# Patient Record
Sex: Female | Born: 1955 | Race: White | Hispanic: No | Smoking: Never smoker
Health system: Southern US, Community
[De-identification: ages and names within clinical notes are randomized; demographics above are authoritative.]

## PROBLEM LIST (undated history)

## (undated) DIAGNOSIS — Z973 Presence of spectacles and contact lenses: Secondary | ICD-10-CM

## (undated) DIAGNOSIS — IMO0001 Reserved for inherently not codable concepts without codable children: Secondary | ICD-10-CM

## (undated) DIAGNOSIS — Z9889 Other specified postprocedural states: Secondary | ICD-10-CM

## (undated) DIAGNOSIS — Z8679 Personal history of other diseases of the circulatory system: Secondary | ICD-10-CM

## (undated) DIAGNOSIS — R112 Nausea with vomiting, unspecified: Secondary | ICD-10-CM

## (undated) DIAGNOSIS — N201 Calculus of ureter: Secondary | ICD-10-CM

## (undated) DIAGNOSIS — H269 Unspecified cataract: Secondary | ICD-10-CM

## (undated) DIAGNOSIS — M1990 Unspecified osteoarthritis, unspecified site: Secondary | ICD-10-CM

## (undated) DIAGNOSIS — Z8619 Personal history of other infectious and parasitic diseases: Secondary | ICD-10-CM

## (undated) DIAGNOSIS — Z87442 Personal history of urinary calculi: Secondary | ICD-10-CM

## (undated) DIAGNOSIS — I10 Essential (primary) hypertension: Secondary | ICD-10-CM

## (undated) DIAGNOSIS — G4730 Sleep apnea, unspecified: Secondary | ICD-10-CM

## (undated) DIAGNOSIS — G4733 Obstructive sleep apnea (adult) (pediatric): Secondary | ICD-10-CM

## (undated) DIAGNOSIS — M419 Scoliosis, unspecified: Secondary | ICD-10-CM

## (undated) DIAGNOSIS — F419 Anxiety disorder, unspecified: Secondary | ICD-10-CM

## (undated) DIAGNOSIS — F32A Depression, unspecified: Secondary | ICD-10-CM

## (undated) DIAGNOSIS — R609 Edema, unspecified: Secondary | ICD-10-CM

## (undated) DIAGNOSIS — F329 Major depressive disorder, single episode, unspecified: Secondary | ICD-10-CM

## (undated) DIAGNOSIS — Z8719 Personal history of other diseases of the digestive system: Secondary | ICD-10-CM

## (undated) DIAGNOSIS — K449 Diaphragmatic hernia without obstruction or gangrene: Secondary | ICD-10-CM

## (undated) DIAGNOSIS — R202 Paresthesia of skin: Secondary | ICD-10-CM

## (undated) HISTORY — PX: HYSTEROSCOPY WITH D & C: SHX1775

## (undated) HISTORY — DX: Depression, unspecified: F32.A

## (undated) HISTORY — PX: LASIK: SHX215

## (undated) HISTORY — PX: BARIATRIC SURGERY: SHX1103

## (undated) HISTORY — PX: EYE SURGERY: SHX253

## (undated) HISTORY — DX: Unspecified cataract: H26.9

## (undated) HISTORY — DX: Personal history of other infectious and parasitic diseases: Z86.19

## (undated) HISTORY — PX: JOINT REPLACEMENT: SHX530

## (undated) HISTORY — DX: Major depressive disorder, single episode, unspecified: F32.9

## (undated) HISTORY — PX: DILATION AND CURETTAGE OF UTERUS: SHX78

## (undated) HISTORY — DX: Unspecified osteoarthritis, unspecified site: M19.90

## (undated) HISTORY — PX: TUBAL LIGATION: SHX77

## (undated) HISTORY — PX: COLONOSCOPY: SHX174

---
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
## 1997-02-03 HISTORY — PX: BREAST SURGERY: SHX581

## 1997-07-12 ENCOUNTER — Other Ambulatory Visit: Admission: RE | Admit: 1997-07-12 | Discharge: 1997-07-12 | Payer: Self-pay | Admitting: Obstetrics and Gynecology

## 1998-02-03 HISTORY — PX: BUNIONECTOMY: SHX129

## 1998-02-03 HISTORY — PX: VEIN LIGATION AND STRIPPING: SHX2653

## 1998-09-26 ENCOUNTER — Other Ambulatory Visit: Admission: RE | Admit: 1998-09-26 | Discharge: 1998-09-26 | Payer: Self-pay | Admitting: Obstetrics and Gynecology

## 1999-04-19 ENCOUNTER — Encounter: Admission: RE | Admit: 1999-04-19 | Discharge: 1999-04-19 | Payer: Self-pay | Admitting: Obstetrics and Gynecology

## 1999-04-19 ENCOUNTER — Encounter: Payer: Self-pay | Admitting: Obstetrics and Gynecology

## 2000-03-03 ENCOUNTER — Other Ambulatory Visit: Admission: RE | Admit: 2000-03-03 | Discharge: 2000-03-03 | Payer: Self-pay | Admitting: Obstetrics and Gynecology

## 2001-03-23 ENCOUNTER — Other Ambulatory Visit: Admission: RE | Admit: 2001-03-23 | Discharge: 2001-03-23 | Payer: Self-pay | Admitting: Obstetrics and Gynecology

## 2001-05-03 ENCOUNTER — Encounter: Admission: RE | Admit: 2001-05-03 | Discharge: 2001-05-03 | Payer: Self-pay | Admitting: Obstetrics and Gynecology

## 2001-05-03 ENCOUNTER — Encounter: Payer: Self-pay | Admitting: Obstetrics and Gynecology

## 2002-04-04 ENCOUNTER — Other Ambulatory Visit: Admission: RE | Admit: 2002-04-04 | Discharge: 2002-04-04 | Payer: Self-pay | Admitting: Obstetrics and Gynecology

## 2002-05-11 ENCOUNTER — Encounter: Payer: Self-pay | Admitting: Obstetrics and Gynecology

## 2002-05-11 ENCOUNTER — Encounter: Admission: RE | Admit: 2002-05-11 | Discharge: 2002-05-11 | Payer: Self-pay | Admitting: Obstetrics and Gynecology

## 2003-05-09 ENCOUNTER — Other Ambulatory Visit: Admission: RE | Admit: 2003-05-09 | Discharge: 2003-05-09 | Payer: Self-pay | Admitting: Obstetrics and Gynecology

## 2003-05-31 ENCOUNTER — Encounter: Admission: RE | Admit: 2003-05-31 | Discharge: 2003-05-31 | Payer: Self-pay | Admitting: Obstetrics and Gynecology

## 2004-05-14 ENCOUNTER — Other Ambulatory Visit: Admission: RE | Admit: 2004-05-14 | Discharge: 2004-05-14 | Payer: Self-pay | Admitting: Obstetrics and Gynecology

## 2004-06-17 ENCOUNTER — Encounter: Admission: RE | Admit: 2004-06-17 | Discharge: 2004-06-17 | Payer: Self-pay | Admitting: Obstetrics and Gynecology

## 2005-02-03 HISTORY — PX: TONSILLECTOMY AND ADENOIDECTOMY: SUR1326

## 2005-04-24 ENCOUNTER — Ambulatory Visit: Payer: Self-pay | Admitting: Otolaryngology

## 2005-05-29 ENCOUNTER — Other Ambulatory Visit: Admission: RE | Admit: 2005-05-29 | Discharge: 2005-05-29 | Payer: Self-pay | Admitting: Obstetrics and Gynecology

## 2007-02-04 HISTORY — PX: KNEE ARTHROSCOPY: SUR90

## 2007-02-04 HISTORY — PX: KNEE SURGERY: SHX244

## 2007-06-23 ENCOUNTER — Ambulatory Visit: Payer: Self-pay

## 2010-04-09 ENCOUNTER — Ambulatory Visit: Payer: Self-pay | Admitting: Family Medicine

## 2010-04-24 ENCOUNTER — Ambulatory Visit: Payer: Self-pay | Admitting: Family Medicine

## 2010-06-18 ENCOUNTER — Encounter: Payer: Self-pay | Admitting: Family Medicine

## 2010-06-18 ENCOUNTER — Ambulatory Visit (INDEPENDENT_AMBULATORY_CARE_PROVIDER_SITE_OTHER): Payer: 59 | Admitting: Family Medicine

## 2010-06-18 DIAGNOSIS — E669 Obesity, unspecified: Secondary | ICD-10-CM

## 2010-06-18 DIAGNOSIS — E785 Hyperlipidemia, unspecified: Secondary | ICD-10-CM

## 2010-06-18 DIAGNOSIS — IMO0002 Reserved for concepts with insufficient information to code with codable children: Secondary | ICD-10-CM | POA: Insufficient documentation

## 2010-06-18 DIAGNOSIS — T887XXA Unspecified adverse effect of drug or medicament, initial encounter: Secondary | ICD-10-CM

## 2010-06-18 DIAGNOSIS — Z0000 Encounter for general adult medical examination without abnormal findings: Secondary | ICD-10-CM

## 2010-06-18 DIAGNOSIS — Z13220 Encounter for screening for lipoid disorders: Secondary | ICD-10-CM

## 2010-06-18 HISTORY — DX: Hyperlipidemia, unspecified: E78.5

## 2010-06-18 NOTE — Patient Instructions (Signed)
Join [employer] watchers -- meetings will work better  Aim for exercise 5 days per week for 20 or more minutes - water exercise may be your best bet  Follow up with gyn as planned Labs today  Follow up in 1 month for wellness visit (any 30 minute slot)

## 2010-06-18 NOTE — Progress Notes (Signed)
Subjective:    Patient ID: Katrina Huffman, female    DOB: 30-Nov-1955, 55 y.o.   MRN: [id number]  HPI Here to get est with practice Lives in [place] -- was recommended by a friend  Used to see Dr Tana Coast (? 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 imaging   Remote hx of breast bx- was neg Has fibrocystic breasts    Td09  colonosc 2006- was ok -- 10 year follow up   Hx of scoliosis - sees a chiropractor Does not do exercises   Has put on a lot of weight recently  Emotional eating - having a lot of stress  Greatly wants to loose the weight    OA knees Also torn meniscus in the past also -- with surgery (? Ski injury in past and going up and down a ladder )  Was told she would need knee repl in the future  Used to have ortho in [facility] - does not have one here yet  Pain after inactivity  No help with cortisone shot too  On mobic-- takes that 2 pills every day   Depression  On celexa-- doing pretty well  Tried to go off of it - and did poorly and went back on it  Right now is fairly stable No hosp or SI in the past    Never smoked much - socially with friends once or twice   No labs in a long long time  Last tot chol 140s?   Now that her life is more together she is ready to start loosing wt  Tried lipozene - briefly  Wt loss supplement - 1-2 times  Likes to swim and do water exercise   Past Medical History  Diagnosis Date  . Arthritis   . History of chicken pox   . Depression     History   Social History  . Marital Status: Divorced    Spouse Name: N/A    Number of Children: N/A  . Years of Education: N/A   Occupational History  . Not on file.   Social History Main Topics    . Smoking status: Former Games developer  . Smokeless tobacco: Not on file  . Alcohol Use: Yes  . Drug Use: No  . Sexually Active:    Other Topics Concern  . Not on file   Social History Narrative  . No narrative on file    Past Surgical History  Procedure Date  . Cesarean section 1983 1985   . Tubal ligation 1986 & 1991  . Vein ligation and stripping 2000  . Bunionectomy 2000  . Knee surgery 2009  . Tonsillectomy and adenoidectomy 2007  . Breast surgery 1999    breast biopsy    Family History  Problem Relation Age of Onset  . Arthritis Mother   . Hypertension Mother   . Arthritis Father   . Cancer Father     prostate CA  . Hypertension Father   . Diabetes Brother   . Cancer Paternal Aunt     breast cancer  . Arthritis Maternal Grandmother   . Hypertension  Maternal Grandmother   . Arthritis Maternal Grandfather   . Hypertension Maternal Grandfather   . Diabetes Maternal Grandfather     Allergies  Allergen Reactions  . Aspirin Nausea Only          Review of Systems Review of Systems  Constitutional: Negative for fever, appetite change, fatigue and unexpected weight change.  Eyes: Negative for pain and visual disturbance.  Respiratory: Negative for cough and shortness of breath.   Cardiovascular: Negative for cp or sob MSK pos for knee pain and occ swelling.   Gastrointestinal: Negative for nausea, diarrhea and constipation.  Genitourinary: Negative for urgency and frequency.  Skin: Negative for pallor.  Neurological: Negative for weakness, light-headedness, numbness and headaches.  Hematological: Negative for adenopathy. Does not bruise/bleed easily.  Psychiatric/Behavioral: Negative for dysphoric mood. The patient is not nervous/anxious.          Objective:   Physical Exam  Constitutional: She appears well-developed and well-nourished. No distress.       overwt and well appearing    HENT:  Head: Normocephalic and atraumatic.  Right Ear: External ear  normal.  Left Ear: External ear normal.  Nose: Nose normal.  Mouth/Throat: Oropharynx is clear and moist.  Eyes: Conjunctivae and EOM are normal. Pupils are equal, round, and reactive to light.  Neck: Normal range of motion. Neck supple. No JVD present. Carotid bruit is not present. No thyromegaly present.  Cardiovascular: Normal rate, regular rhythm and normal heart sounds.   Pulmonary/Chest: Effort normal and breath sounds normal.  Abdominal: Soft. Bowel sounds are normal. There is no tenderness.  Musculoskeletal: She exhibits no edema and no tenderness.       Poor rom knees   Lymphadenopathy:    She has no cervical adenopathy.  Neurological: She is alert. She has normal reflexes. Coordination normal.  Skin: Skin is warm and dry. No rash noted. No erythema. No pallor.  Psychiatric: She has a normal mood and affect.          Assessment & Plan:

## 2010-06-19 LAB — CBC WITH DIFFERENTIAL/PLATELET
Basophils Absolute: 0.1 10*3/uL (ref 0.0–0.1)
Basophils Relative: 0.8 % (ref 0.0–3.0)
Eosinophils Absolute: 0.2 10*3/uL (ref 0.0–0.7)
Eosinophils Relative: 2.9 % (ref 0.0–5.0)
HCT: 39.1 % (ref 36.0–46.0)
Hemoglobin: 13.5 g/dL (ref 12.0–15.0)
Lymphocytes Relative: 27.8 % (ref 12.0–46.0)
Lymphs Abs: 2 10*3/uL (ref 0.7–4.0)
MCHC: 34.4 g/dL (ref 30.0–36.0)
MCV: 87.6 fl (ref 78.0–100.0)
Monocytes Absolute: 0.6 10*3/uL (ref 0.1–1.0)
Monocytes Relative: 7.7 % (ref 3.0–12.0)
Neutro Abs: 4.4 10*3/uL (ref 1.4–7.7)
Neutrophils Relative %: 60.8 % (ref 43.0–77.0)
Platelets: 225 10*3/uL (ref 150.0–400.0)
RBC: 4.47 Mil/uL (ref 3.87–5.11)
RDW: 13.6 % (ref 11.5–14.6)
WBC: 7.2 10*3/uL (ref 4.5–10.5)

## 2010-06-19 LAB — COMPREHENSIVE METABOLIC PANEL
ALT: 23 U/L (ref 0–35)
AST: 20 U/L (ref 0–37)
Albumin: 3.8 g/dL (ref 3.5–5.2)
Alkaline Phosphatase: 89 U/L (ref 39–117)
BUN: 15 mg/dL (ref 6–23)
CO2: 28 mEq/L (ref 19–32)
Calcium: 8.9 mg/dL (ref 8.4–10.5)
Chloride: 106 mEq/L (ref 96–112)
Creatinine, Ser: 0.7 mg/dL (ref 0.4–1.2)
GFR: 98.33 mL/min (ref >60.00)
Glucose, Bld: 88 mg/dL (ref 70–99)
Potassium: 4.2 mEq/L (ref 3.5–5.1)
Sodium: 142 mEq/L (ref 135–145)
Total Bilirubin: 0.3 mg/dL (ref 0.3–1.2)
Total Protein: 6.4 g/dL (ref 6.0–8.3)

## 2010-06-19 LAB — LIPID PANEL
Cholesterol: 165 mg/dL (ref 0–200)
LDL Cholesterol: 91 mg/dL (ref 0–99)
Triglycerides: 138 mg/dL (ref 0.0–149.0)

## 2010-06-19 LAB — TSH: TSH: 2.27 u[IU]/mL (ref 0.35–5.50)

## 2010-07-17 ENCOUNTER — Ambulatory Visit (INDEPENDENT_AMBULATORY_CARE_PROVIDER_SITE_OTHER): Payer: 59 | Admitting: Family Medicine

## 2010-07-17 ENCOUNTER — Encounter: Payer: Self-pay | Admitting: Family Medicine

## 2010-07-17 DIAGNOSIS — Z13220 Encounter for screening for lipoid disorders: Secondary | ICD-10-CM

## 2010-07-17 DIAGNOSIS — Z0000 Encounter for general adult medical examination without abnormal findings: Secondary | ICD-10-CM

## 2010-07-17 NOTE — Patient Instructions (Signed)
Make sure to call Dr Jerolyn Center office to get your mammogram  Try to get 1200-1500 mg of calcium per day with at least 1000 iu of vitamin D - for bone health  Exercise 5 days per week for 30  Or more minutes Keep up the good effort with weight watchers Labs look good

## 2010-07-17 NOTE — Assessment & Plan Note (Signed)
Good lipid profile- reviewed labs with pt Rev low satfat diet also

## 2010-07-17 NOTE — Progress Notes (Signed)
Subjective:    Patient ID: Katrina HACKWORTH, female    DOB: 03/16/1955, 55 y.o.   MRN: [id number]  HPI Here for wellness exam and to disc chronic health problems  Saw gyn- last week Thursday Dr Doristine Church Pap nl and pelvic and breast exam No more bleeding problems (? Due to fibroids0 Mam - has not had -- she is waiting on call back with that   colonosc nl 2006- with 10 y f/u recommended  Not due until 2016  No family hx of colon cancer   Obesity- lost 6 lb since last visit Diet- is on weight watchers - happy with it ! So far (over 6 lb by her scales)  Is going to meetings Also bought a pedometer Rain prohibits water exercise sometimes  Exercise- can use her pedaler when weather is bad    Lipid screen overall good with HDL 46 and LDL 91 Lab Results  Component Value Date   CHOL 165 06/18/2010   Lab Results  Component Value Date   HDL 46.00 06/18/2010   Lab Results  Component Value Date   LDLCALC 91 06/18/2010   Lab Results  Component Value Date   TRIG 138.0 06/18/2010   Lab Results  Component Value Date   CHOLHDL 4 06/18/2010   No results found for this basename: LDLDIRECT   now on a good diet - likely even better   Depression - is still doing well  Lost her mother and burying her ashes this weekend - a little difficult and emotional   Last Tdap - was in April 09   Patient Active Problem List  Diagnoses  . Screening for lipoid disorders  . Routine general medical examination at a health care facility  . Obesity  . Adverse effect   Past Medical History  Diagnosis Date  . Arthritis   . History of chicken pox   . Depression    Past Surgical History  Procedure Date  . Cesarean section 1983 1985   . Tubal ligation 1986 & 1991  . Vein ligation and stripping 2000  . Bunionectomy 2000  . Knee surgery 2009  . Tonsillectomy and adenoidectomy 2007  . Breast surgery 1999    breast biopsy   History  Substance Use Topics  . Smoking status: Never Smoker   . Smokeless  tobacco: Not on file  . Alcohol Use: Yes   Family History  Problem Relation Age of Onset  . Arthritis Mother   . Hypertension Mother   . Arthritis Father   . Cancer Father     prostate CA  . Hypertension Father   . Diabetes Brother   . Cancer Paternal Aunt     breast cancer  . Arthritis Maternal Grandmother   . Hypertension Maternal Grandmother   . Arthritis Maternal Grandfather   . Hypertension Maternal Grandfather   . Diabetes Maternal Grandfather    Allergies  Allergen Reactions  . Aspirin Nausea Only   Current Outpatient Prescriptions on File Prior to Visit  Medication Sig Dispense Refill  . citalopram (CELEXA) 20 MG tablet Take 20 mg by mouth daily.        . meloxicam (MOBIC) 7.5 MG tablet Take 15 mg by mouth daily.             Review of Systems Review of Systems  Constitutional: Negative for fever, appetite change, fatigue and unexpected weight change.  Eyes: Negative for pain and visual disturbance.  Respiratory: Negative for cough and shortness of breath.  Cardiovascular: Negative.  for cp or sob Gastrointestinal: Negative for nausea, diarrhea and constipation.  Genitourinary: Negative for urgency and frequency.  MSK pos for chronic knee pain Skin: Negative for pallor.  Neurological: Negative for weakness, light-headedness, numbness and headaches.  Hematological: Negative for adenopathy. Does not bruise/bleed easily.  Psychiatric/Behavioral: Negative for dysphoric mood. The patient is not nervous/anxious.          Objective:   Physical Exam  Constitutional: She appears well-developed and well-nourished. No distress.       overwt and well appearing   HENT:  Head: Normocephalic and atraumatic.  Right Ear: External ear normal.  Left Ear: External ear normal.  Nose: Nose normal.  Mouth/Throat: Oropharynx is clear and moist.  Eyes: Conjunctivae and EOM are normal. Pupils are equal, round, and reactive to light.  Neck: Normal range of motion. Neck  supple. No JVD present. Carotid bruit is not present. Erythema present. No thyromegaly present.  Cardiovascular: Normal rate, regular rhythm and normal heart sounds.   Pulmonary/Chest: Effort normal and breath sounds normal. No respiratory distress. She has no wheezes. She has no rales.  Abdominal: Soft. Bowel sounds are normal. She exhibits no distension, no abdominal bruit and no mass. There is no tenderness.  Musculoskeletal: She exhibits no edema and no tenderness.       Poor rom of knees bilat  Lymphadenopathy:    She has no cervical adenopathy.  Skin: Skin is warm and dry. No rash noted. No erythema. No pallor.  Psychiatric: She has a normal mood and affect.          Assessment & Plan:

## 2010-07-17 NOTE — Assessment & Plan Note (Signed)
Reviewed health habits including diet and exercise and skin cancer prevention Also reviewed health mt list, fam hx and immunizations  Disc imp of wt loss- will continue wt watchers Made rec for exercise Rev wellness labs

## 2010-09-18 ENCOUNTER — Ambulatory Visit: Payer: Self-pay | Admitting: Obstetrics and Gynecology

## 2011-07-07 ENCOUNTER — Telehealth: Payer: Self-pay | Admitting: Family Medicine

## 2011-07-07 NOTE — Telephone Encounter (Signed)
Please have her come in for a visit- I need to get some hx and info before I refer  Thanks

## 2011-07-07 NOTE — Telephone Encounter (Signed)
Patient notified as instructed by telephone. Appointment scheduled with Dr. Milinda Antis.

## 2011-07-07 NOTE — Telephone Encounter (Signed)
Patient would like to loose wait by having Lap-Band surgery, so she needs a referral to have that done.

## 2011-07-09 ENCOUNTER — Encounter: Payer: Self-pay | Admitting: Family Medicine

## 2011-07-09 ENCOUNTER — Ambulatory Visit (INDEPENDENT_AMBULATORY_CARE_PROVIDER_SITE_OTHER): Payer: 59 | Admitting: Family Medicine

## 2011-07-09 VITALS — BP 128/86 | HR 80 | Temp 97.7°F | Ht 65.5 in | Wt 271.5 lb

## 2011-07-09 DIAGNOSIS — E669 Obesity, unspecified: Secondary | ICD-10-CM

## 2011-07-09 NOTE — Patient Instructions (Addendum)
Go home and try to write down- to the best of your ability- all the weight loss efforts you have made with dates -we need a record Aim for exercise  30 minutes 5 days per week - bike or water  Keep watching your diet Also keep working with OA  We will do surgical referral at check out

## 2011-07-09 NOTE — Progress Notes (Signed)
Subjective:    Patient ID: Katrina Huffman, female    DOB: September 12, 1955, 56 y.o.   MRN: [id number]  HPI Is thinking about lap band for weight loss  Joined weight watchers a year ago - did it for over 6 months  Did loose total 15 lb - then was up and down  Did get good support  Was using the recombant bike 3 months -- for about 40 minutes    Needs knee replacement - so exercise is tricky  At one time did join a gym for women  Used a recombent bike  Does like to get in the pool in the summer time - water aerobics   Has also tried [facility]  [facility] - lost 25 lb in 6 weeks (was also nursing a baby) Weight watchers twice  No weight loss drugs  Also shakley   Had personal trainer in the past - [facility] 3 [facility]   Last 7 years -- gained 70 lb with divorce and life changes  No obesity in the family  Is a stress eater at some times/ even more boredom and lonliness  Now she goes to [employer]  Good support     Chemistry      Component Value Date/Time   NA 142 06/18/2010 1642   K 4.2 06/18/2010 1642   CL 106 06/18/2010 1642   CO2 28 06/18/2010 1642   BUN 15 06/18/2010 1642   CREATININE 0.7 06/18/2010 1642      Component Value Date/Time   CALCIUM 8.9 06/18/2010 1642   ALKPHOS 89 06/18/2010 1642   AST 20 06/18/2010 1642   ALT 23 06/18/2010 1642   BILITOT 0.3 06/18/2010 1642     Lab Results  Component Value Date   CHOL 165 06/18/2010   HDL 46.00 06/18/2010   LDLCALC 91 06/18/2010   TRIG 138.0 06/18/2010   CHOLHDL 4 06/18/2010    Wants to see Dr Daphine Deutscher or Dr Kevan Rosebush is up 7 lb with bmi of 44 at this time The wt is starting to affect mobility and joint health    Patient Active Problem List  Diagnosis  . Screening for lipoid disorders  . Routine general medical examination at a health care facility  . Obesity  . Adverse effect   Past Medical History  Diagnosis Date  . Arthritis   . History of chicken pox   . Depression     Past Surgical History  Procedure Date  . Cesarean section 1983 1985   . Tubal ligation 1986 & 1991  . Vein ligation and stripping 2000  . Bunionectomy 2000  . Knee surgery 2009  . Tonsillectomy and adenoidectomy 2007  . Breast surgery 1999    breast biopsy   History  Substance Use Topics  . Smoking status: Never Smoker   . Smokeless tobacco: Not on file  . Alcohol Use: Yes   Family History  Problem Relation Age of Onset  . Arthritis Mother   . Hypertension Mother   . Arthritis Father   . Cancer Father     prostate CA  . Hypertension Father   . Diabetes Brother   . Cancer Paternal Aunt     breast cancer  . Arthritis Maternal Grandmother   . Hypertension Maternal Grandmother   . Arthritis Maternal Grandfather   . Hypertension Maternal Grandfather   . Diabetes Maternal Grandfather    Allergies  Allergen Reactions  .  Aspirin Nausea Only   Current Outpatient Prescriptions on File Prior to Visit  Medication Sig Dispense Refill  . citalopram (CELEXA) 20 MG tablet Take 20 mg by mouth daily.        . meloxicam (MOBIC) 7.5 MG tablet Take 15 mg by mouth daily.           Review of Systems Review of Systems  Constitutional: Negative for fever, appetite change, and unexpected weight change. pos for fatigue  Eyes: Negative for pain and visual disturbance.  Respiratory: Negative for cough and shortness of breath.  (pos for deconditioning with sob on exertion more than she used to have.. This worsens with wt gain)   Cardiovascular: Negative for cp or palpitations    Gastrointestinal: Negative for nausea, diarrhea and constipation.  Genitourinary: Negative for urgency and frequency. no excessive thirst or urination Skin: Negative for pallor or rash   MSK pos for aches and pains/ joint stiffness without swelling  Neurological: Negative for weakness, light-headedness, numbness and headaches.  Hematological: Negative for adenopathy. Does not bruise/bleed easily.   Psychiatric/Behavioral: Negative for dysphoric mood. The patient is not nervous/anxious.         Objective:   Physical Exam  Constitutional: She appears well-developed and well-nourished. No distress.       Obese and well appearing   HENT:  Head: Normocephalic and atraumatic.  Mouth/Throat: Oropharynx is clear and moist.  Eyes: Conjunctivae and EOM are normal. Pupils are equal, round, and reactive to light. No scleral icterus.  Neck: Normal range of motion. Neck supple. No JVD present. Carotid bruit is not present. No thyromegaly present.  Cardiovascular: Normal rate, regular rhythm, normal heart sounds and intact distal pulses.  Exam reveals no gallop.   Pulmonary/Chest: Effort normal and breath sounds normal. No respiratory distress. She has no wheezes.  Abdominal: Soft. Bowel sounds are normal. She exhibits no distension, no abdominal bruit and no mass. There is no tenderness.  Musculoskeletal: She exhibits no edema and no tenderness.  Lymphadenopathy:    She has no cervical adenopathy.  Neurological: She is alert. She has normal reflexes. No cranial nerve deficit. She exhibits normal muscle tone. Coordination normal.  Skin: Skin is warm and dry. No rash noted. No erythema. No pallor.  Psychiatric: She has a normal mood and affect.          Assessment & Plan:

## 2011-07-19 NOTE — Assessment & Plan Note (Signed)
Very long disc today about pros and cons of bariatric surgery and if it is right for her Rev her past wt loss efforts in detail  Stress eating plays a big role and will need to be addressed by a psychologist  Rev expectations of surgery- she thinks she wants lap band Disc co morbidities/health threats from obesity Ref to gen surg to attend the pre visit seminar >25 min spent with face to face with patient, >50% counseling and/or coordinating care

## 2012-02-04 HISTORY — PX: TOTAL KNEE ARTHROPLASTY: SHX125

## 2012-09-30 ENCOUNTER — Encounter: Payer: Self-pay | Admitting: Family Medicine

## 2012-09-30 ENCOUNTER — Ambulatory Visit (INDEPENDENT_AMBULATORY_CARE_PROVIDER_SITE_OTHER): Payer: BC Managed Care – PPO | Admitting: Family Medicine

## 2012-09-30 VITALS — BP 120/78 | HR 90 | Temp 99.6°F | Ht 65.5 in | Wt 266.0 lb

## 2012-09-30 DIAGNOSIS — W5501XA Bitten by cat, initial encounter: Secondary | ICD-10-CM

## 2012-09-30 DIAGNOSIS — T148XXA Other injury of unspecified body region, initial encounter: Secondary | ICD-10-CM

## 2012-09-30 DIAGNOSIS — J069 Acute upper respiratory infection, unspecified: Secondary | ICD-10-CM

## 2012-09-30 DIAGNOSIS — IMO0001 Reserved for inherently not codable concepts without codable children: Secondary | ICD-10-CM

## 2012-09-30 NOTE — Progress Notes (Signed)
  Subjective:    Patient ID: Katrina Huffman, female    DOB: August 15, 1955, 57 y.o.   MRN: [id number]  Sinusitis This is a new problem. The current episode started in the past 7 days. The problem has been gradually worsening since onset. There has been no fever. The pain is moderate. Associated symptoms include congestion, coughing, ear pain, a hoarse voice, sinus pressure and a sore throat. Pertinent negatives include no headaches, neck pain, shortness of breath, sneezing or swollen glands. (PND chest heaviness Cough keeping her up some at night) Past treatments include oral decongestants (cream sherry, vicks daytime sinus). The treatment provided moderate relief.    Her cat bit her 5 days ago ( cat was acting nml, just playing but is not uptodate with rabies vaccine)...then area became mildly red, swollen, somewhat tender.  She is treating with hydrogen peroxide. Improving over time. Has not applied any antibiotics ointment. She is uptodate with Td.    Review of Systems  HENT: Positive for ear pain, congestion, sore throat, hoarse voice and sinus pressure. Negative for sneezing and neck pain.   Respiratory: Positive for cough. Negative for shortness of breath.   Neurological: Negative for headaches.       Objective:   Physical Exam  Constitutional: Vital signs are normal. She appears well-developed and well-nourished. She is cooperative.  Non-toxic appearance. She does not appear ill. No distress.  HENT:  Head: Normocephalic.  Right Ear: Hearing, external ear and ear canal normal. Tympanic membrane is not erythematous, not retracted and not bulging. A middle ear effusion is present.  Left Ear: Hearing, external ear and ear canal normal. Tympanic membrane is not erythematous, not retracted and not bulging. A middle ear effusion is present.  Nose: Mucosal edema and rhinorrhea present. Right sinus exhibits no maxillary sinus tenderness and no frontal sinus tenderness. Left sinus exhibits no  maxillary sinus tenderness and no frontal sinus tenderness.  Mouth/Throat: Uvula is midline and mucous membranes are normal. Posterior oropharyngeal erythema present. No oropharyngeal exudate or posterior oropharyngeal edema.  PNDrip  Eyes: Conjunctivae, EOM and lids are normal. Pupils are equal, round, and reactive to light. Lids are everted and swept, no foreign bodies found.  Neck: Trachea normal and normal range of motion. Neck supple. Carotid bruit is not present. No mass and no thyromegaly present.  Cardiovascular: Normal rate, regular rhythm, S1 normal, S2 normal, normal heart sounds, intact distal pulses and normal pulses.  Exam reveals no gallop and no friction rub.   No murmur heard. Pulmonary/Chest: Effort normal and breath sounds normal. Not tachypneic. No respiratory distress. She has no decreased breath sounds. She has no wheezes. She has no rhonchi. She has no rales.  Neurological: She is alert.  Skin: Skin is warm, dry and intact. No rash noted.  Left hand dorsum.. Small puncture with 1.5 cm surrounding erythema, slight warmth, improving per pt.  Psychiatric: Her speech is normal and behavior is normal. Judgment normal. Her mood appears not anxious. Cognition and memory are normal. She does not exhibit a depressed mood.          Assessment & Plan:

## 2012-09-30 NOTE — Patient Instructions (Addendum)
Mucinex plain or DM dureing the day for congestion and cough. Nasal saline spray or irigation.Marland Kitchen 2-3 times a day. Symptomatic care. Apply antibiotic ointment to cat bite twice daily.. Call if redness spreading.

## 2012-09-30 NOTE — Assessment & Plan Note (Signed)
Symptomatic care 

## 2012-09-30 NOTE — Assessment & Plan Note (Signed)
Improving. Treat with antibiotic ointment.

## 2014-12-16 DIAGNOSIS — N950 Postmenopausal bleeding: Secondary | ICD-10-CM | POA: Insufficient documentation

## 2015-03-30 ENCOUNTER — Telehealth: Payer: Self-pay | Admitting: Gastroenterology

## 2015-03-30 NOTE — Telephone Encounter (Signed)
colonoscopy

## 2015-04-13 ENCOUNTER — Other Ambulatory Visit: Payer: Self-pay

## 2015-04-13 NOTE — Telephone Encounter (Signed)
Gastroenterology Pre-Procedure Review  Request Date: 04/30/15 Requesting Physician: Dr.   PATIENT REVIEW QUESTIONS: The patient responded to the following health history questions as indicated:    1. Are you having any GI issues? no 2. Do you have a personal history of Polyps? no 3. Do you have a family history of Colon Cancer or Polyps? no 4. Diabetes Mellitus? no 5. Joint replacements in the past 12 months?no 6. Major health problems in the past 3 months?yes (Surgery Jan 2017 D & C) 7. Any artificial heart valves, MVP, or defibrillator?no    MEDICATIONS & ALLERGIES:    Patient reports the following regarding taking any anticoagulation/antiplatelet therapy:   Plavix, Coumadin, Eliquis, Xarelto, Lovenox, Pradaxa, Brilinta, or Effient? no Aspirin? no  Patient confirms/reports the following medications:  Current Outpatient Prescriptions  Medication Sig Dispense Refill  . citalopram (CELEXA) 20 MG tablet Take 20 mg by mouth daily.      . hydrochlorothiazide (HYDRODIURIL) 25 MG tablet Take 25 mg by mouth daily.    . meloxicam (MOBIC) 7.5 MG tablet Take 15 mg by mouth daily.      . Turmeric 500 MG CAPS Take 500 mg by mouth daily.    . Vitamin D, Ergocalciferol, (DRISDOL) 50000 UNITS CAPS capsule Take 50,000 Units by mouth every 7 (seven) days.     No current facility-administered medications for this visit.    Patient confirms/reports the following allergies:  Allergies  Allergen Reactions  . Aspirin Nausea Only    No orders of the defined types were placed in this encounter.    AUTHORIZATION INFORMATION Primary Insurance: 1D#: Group #:  Secondary Insurance: 1D#: Group #:  SCHEDULE INFORMATION: Date: 04/30/15 Time: Location: [place]

## 2015-04-13 NOTE — Telephone Encounter (Signed)
Pt scheduled for screening colonoscopy at [facility] on 04/30/15. Instructs/rx mailed. Please precert insurance.

## 2015-04-24 ENCOUNTER — Encounter: Payer: Self-pay | Admitting: *Deleted

## 2015-04-25 ENCOUNTER — Ambulatory Visit: Payer: Self-pay | Admitting: Orthopedic Surgery

## 2015-04-25 NOTE — Progress Notes (Signed)
Preoperative surgical orders have been place into the Epic hospital system for Katrina Huffman on 04/25/2015, 10:42 AM  by Mickel Crow for surgery on 05/09/2015.  Preop Knee Scope orders including IV Tylenol and IV Decadron as long as there are no contraindications to the above medications. [name], PA-C

## 2015-04-26 ENCOUNTER — Encounter (HOSPITAL_COMMUNITY): Payer: Self-pay

## 2015-04-26 ENCOUNTER — Encounter (HOSPITAL_COMMUNITY)
Admission: RE | Admit: 2015-04-26 | Discharge: 2015-04-26 | Disposition: A | Discharge status: Home / Self Care | Payer: BLUE CROSS/BLUE SHIELD | Source: Ambulatory Visit | Attending: Orthopedic Surgery | Admitting: Orthopedic Surgery

## 2015-04-26 ENCOUNTER — Other Ambulatory Visit: Payer: Self-pay

## 2015-04-26 DIAGNOSIS — M25862 Other specified joint disorders, left knee: Secondary | ICD-10-CM | POA: Diagnosis not present

## 2015-04-26 DIAGNOSIS — Z01810 Encounter for preprocedural cardiovascular examination: Secondary | ICD-10-CM | POA: Diagnosis present

## 2015-04-26 DIAGNOSIS — I10 Essential (primary) hypertension: Secondary | ICD-10-CM | POA: Diagnosis not present

## 2015-04-26 DIAGNOSIS — Z01812 Encounter for preprocedural laboratory examination: Secondary | ICD-10-CM | POA: Insufficient documentation

## 2015-04-26 HISTORY — DX: Edema, unspecified: R60.9

## 2015-04-26 LAB — CBC
HEMATOCRIT: 39.7 % (ref 36.0–46.0)
HEMOGLOBIN: 13.1 g/dL (ref 12.0–15.0)
MCH: 28.1 pg (ref 26.0–34.0)
MCHC: 33 g/dL (ref 30.0–36.0)
MCV: 85 fL (ref 78.0–100.0)
Platelets: 254 10*3/uL (ref 150–400)
RBC: 4.67 MIL/uL (ref 3.87–5.11)
RDW: 13.8 % (ref 11.5–15.5)
WBC: 8.1 10*3/uL (ref 4.0–10.5)

## 2015-04-26 LAB — BASIC METABOLIC PANEL
ANION GAP: 9 (ref 5–15)
BUN: 22 mg/dL — ABNORMAL HIGH (ref 6–20)
CO2: 28 mmol/L (ref 22–32)
Calcium: 8.9 mg/dL (ref 8.9–10.3)
Chloride: 103 mmol/L (ref 101–111)
Creatinine, Ser: 0.72 mg/dL (ref 0.44–1.00)
GFR calc Af Amer: >60 mL/min (ref >60)
GFR calc non Af Amer: >60 mL/min (ref >60)
GLUCOSE: 95 mg/dL (ref 65–99)
POTASSIUM: 4.2 mmol/L (ref 3.5–5.1)
Sodium: 140 mmol/L (ref 135–145)

## 2015-04-26 LAB — SURGICAL PCR SCREEN
MRSA, PCR: NEGATIVE
Staphylococcus aureus: POSITIVE — AB

## 2015-04-26 MED ORDER — PEG 3350-KCL-NABCB-NACL-NASULF 236 G PO SOLR: 4000.0000 mL | Freq: Once | ORAL | Status: DC

## 2015-04-26 NOTE — Patient Instructions (Signed)
Katrina [facility]man  04/26/2015   Your procedure is scheduled on: 05-09-15  Report to [facility]  Entrance take [facility]  elevators to 3rd floor to  [place] at 0800 AM.  Call this number if you have problems the morning of surgery [phone]   Remember: ONLY 1 PERSON MAY GO WITH YOU TO SHORT STAY TO GET  READY MORNING OF [place].  Do not eat food or drink liquids :After Midnight.     Take these medicines the morning of surgery with A SIP OF WATER: Citalopram.  DO NOT TAKE ANY DIABETIC MEDICATIONS DAY OF YOUR SURGERY                               You may not have any metal on your body including hair pins and              piercings  Do not wear jewelry, make-up, lotions, powders or perfumes, deodorant             Do not wear nail polish.  Do not shave  48 hours prior to surgery.              Men may shave face and neck.   Do not bring valuables to the hospital. [place].  Contacts, dentures or bridgework may not be worn into surgery.  Leave suitcase in the car. After surgery it may be brought to your room.     Patients discharged the day of surgery will not be allowed to drive home.  Name and phone number of your driver:Jamie Lerry Liner, brother [phone] cell  Special Instructions: N/A              Please read over the following fact sheets you were given: _____________________________________________________________________             [facility] - Preparing for Surgery Before surgery, you can play an important role.  Because skin is not sterile, your skin needs to be as free of germs as possible.  You can reduce the number of germs on your skin by washing with CHG (chlorahexidine gluconate) soap before surgery.  CHG is an antiseptic cleaner which kills germs and bonds with the skin to continue killing germs even after washing. Please DO NOT use if you have an allergy to CHG or antibacterial  soaps.  If your skin becomes reddened/irritated stop using the CHG and inform your nurse when you arrive at Short Stay. Do not shave (including legs and underarms) for at least 48 hours prior to the first CHG shower.  You may shave your face/neck. Please follow these instructions carefully:  1.  Shower with CHG Soap the night before surgery and the  morning of Surgery.  2.  If you choose to wash your hair, wash your hair first as usual with your  normal  shampoo.  3.  After you shampoo, rinse your hair and body thoroughly to remove the  shampoo.                           4.  Use CHG as you would any other liquid soap.  You can apply chg directly  to the  skin and wash                       Gently with a scrungie or clean washcloth.  5.  Apply the CHG Soap to your body ONLY FROM THE NECK DOWN.   Do not use on face/ open                           Wound or open sores. Avoid contact with eyes, ears mouth and genitals (private parts).                       Wash face,  Genitals (private parts) with your normal soap.             6.  Wash thoroughly, paying special attention to the area where your surgery  will be performed.  7.  Thoroughly rinse your body with warm water from the neck down.  8.  DO NOT shower/wash with your normal soap after using and rinsing off  the CHG Soap.                9.  Pat yourself dry with a clean towel.            10.  Wear clean pajamas.            11.  Place clean sheets on your bed the night of your first shower and do not  sleep with pets. Day of Surgery : Do not apply any lotions/deodorants the morning of surgery.  Please wear clean clothes to the hospital/surgery center.  FAILURE TO FOLLOW THESE INSTRUCTIONS MAY RESULT IN THE CANCELLATION OF YOUR SURGERY PATIENT SIGNATURE_________________________________  NURSE SIGNATURE__________________________________  ________________________________________________________________________   Adam Phenix  An  incentive spirometer is a tool that can help keep your lungs clear and active. This tool measures how well you are filling your lungs with each breath. Taking long deep breaths may help reverse or decrease the chance of developing breathing (pulmonary) problems (especially infection) following:  A long period of time when you are unable to move or be active. BEFORE THE PROCEDURE   If the spirometer includes an indicator to show your best effort, your nurse or respiratory therapist will set it to a desired goal.  If possible, sit up straight or lean slightly forward. Try not to slouch.  Hold the incentive spirometer in an upright position. INSTRUCTIONS FOR USE  1. Sit on the edge of your bed if possible, or sit up as far as you can in bed or on a chair. 2. Hold the incentive spirometer in an upright position. 3. Breathe out normally. 4. Place the mouthpiece in your mouth and seal your lips tightly around it. 5. Breathe in slowly and as deeply as possible, raising the piston or the ball toward the top of the column. 6. Hold your breath for 3-5 seconds or for as long as possible. Allow the piston or ball to fall to the bottom of the column. 7. Remove the mouthpiece from your mouth and breathe out normally. 8. Rest for a few seconds and repeat Steps 1 through 7 at least 10 times every 1-2 hours when you are awake. Take your time and take a few normal breaths between deep breaths. 9. The spirometer may include an indicator to show your best effort. Use the indicator as a goal to work toward during each repetition. 10. After each set of  10 deep breaths, practice coughing to be sure your lungs are clear. If you have an incision (the cut made at the time of surgery), support your incision when coughing by placing a pillow or rolled up towels firmly against it. Once you are able to get out of bed, walk around indoors and cough well. You may stop using the incentive spirometer when instructed by your  caregiver.  RISKS AND COMPLICATIONS  Take your time so you do not get dizzy or light-headed.  If you are in pain, you may need to take or ask for pain medication before doing incentive spirometry. It is harder to take a deep breath if you are having pain. AFTER USE  Rest and breathe slowly and easily.  It can be helpful to keep track of a log of your progress. Your caregiver can provide you with a simple table to help with this. If you are using the spirometer at home, follow these instructions: [place] IF:   You are having difficultly using the spirometer.  You have trouble using the spirometer as often as instructed.  Your pain medication is not giving enough relief while using the spirometer.  You develop fever of 100.5 F (38.1 C) or higher. SEEK IMMEDIATE MEDICAL CARE IF:   You cough up bloody sputum that had not been present before.  You develop fever of 102 F (38.9 C) or greater.  You develop worsening pain at or near the incision site. MAKE SURE YOU:   Understand these instructions.  Will watch your condition.  Will get help right away if you are not doing well or get worse. Document Released: 06/02/2006 Document Revised: 04/14/2011 Document Reviewed: 08/03/2006 [facility] Patient Information 2014 [place], [place].   ________________________________________________________________________

## 2015-04-26 NOTE — Pre-Procedure Instructions (Signed)
EKG done today. PCR done per MD order.

## 2015-04-26 NOTE — Pre-Procedure Instructions (Addendum)
04-26-15 1710 Positive Staph aureus -pt to use Mupirocin as directed. Note faxed to Dr. Wynelle Link office [phone]. 04-26-15 [place] called to [place] ,[place] [phone]. 04-30-15 1250 Dr. Deatra Canter- reviewed EKG- may proceed as planned.

## 2015-04-27 NOTE — Discharge Instructions (Signed)

## 2015-04-30 ENCOUNTER — Ambulatory Visit
Admission: RE | Admit: 2015-04-30 | Discharge: 2015-04-30 | Disposition: A | Discharge status: Home / Self Care | Payer: BLUE CROSS/BLUE SHIELD | Source: Ambulatory Visit | Attending: Gastroenterology | Admitting: Gastroenterology

## 2015-04-30 ENCOUNTER — Ambulatory Visit: Payer: BLUE CROSS/BLUE SHIELD | Admitting: Anesthesiology

## 2015-04-30 ENCOUNTER — Encounter
Admission: RE | Disposition: A | Discharge status: Home / Self Care | Payer: Self-pay | Source: Ambulatory Visit | Attending: Gastroenterology

## 2015-04-30 DIAGNOSIS — Z1211 Encounter for screening for malignant neoplasm of colon: Secondary | ICD-10-CM | POA: Diagnosis not present

## 2015-04-30 DIAGNOSIS — Z79899 Other long term (current) drug therapy: Secondary | ICD-10-CM | POA: Diagnosis not present

## 2015-04-30 DIAGNOSIS — Z6841 Body Mass Index (BMI) 40.0 and over, adult: Secondary | ICD-10-CM | POA: Insufficient documentation

## 2015-04-30 DIAGNOSIS — Z96652 Presence of left artificial knee joint: Secondary | ICD-10-CM | POA: Insufficient documentation

## 2015-04-30 DIAGNOSIS — K641 Second degree hemorrhoids: Secondary | ICD-10-CM | POA: Insufficient documentation

## 2015-04-30 DIAGNOSIS — I10 Essential (primary) hypertension: Secondary | ICD-10-CM | POA: Diagnosis not present

## 2015-04-30 DIAGNOSIS — D123 Benign neoplasm of transverse colon: Secondary | ICD-10-CM | POA: Insufficient documentation

## 2015-04-30 DIAGNOSIS — Z9851 Tubal ligation status: Secondary | ICD-10-CM | POA: Diagnosis not present

## 2015-04-30 DIAGNOSIS — F329 Major depressive disorder, single episode, unspecified: Secondary | ICD-10-CM | POA: Insufficient documentation

## 2015-04-30 HISTORY — PX: COLONOSCOPY WITH PROPOFOL: SHX5780

## 2015-04-30 HISTORY — DX: Reserved for inherently not codable concepts without codable children: IMO0001

## 2015-04-30 HISTORY — DX: Scoliosis, unspecified: M41.9

## 2015-04-30 HISTORY — PX: POLYPECTOMY: SHX5525

## 2015-04-30 HISTORY — DX: Essential (primary) hypertension: I10

## 2015-04-30 SURGERY — COLONOSCOPY WITH PROPOFOL
Anesthesia: Monitor Anesthesia Care | Wound class: Contaminated

## 2015-04-30 MED ORDER — LIDOCAINE HCL (CARDIAC) 20 MG/ML IV SOLN
INTRAVENOUS | Status: DC | PRN
  Administered 2015-04-30: 50 mg via INTRAVENOUS

## 2015-04-30 MED ORDER — STERILE WATER FOR IRRIGATION IR SOLN
Status: DC | PRN
  Administered 2015-04-30: 09:00:00

## 2015-04-30 MED ORDER — LACTATED RINGERS IV SOLN
INTRAVENOUS | Status: DC
  Administered 2015-04-30: 09:00:00 via INTRAVENOUS

## 2015-04-30 MED ORDER — PROPOFOL 10 MG/ML IV BOLUS
INTRAVENOUS | Status: DC | PRN
  Administered 2015-04-30: 30 mg via INTRAVENOUS
  Administered 2015-04-30: 100 mg via INTRAVENOUS
  Administered 2015-04-30: 20 mg via INTRAVENOUS
  Administered 2015-04-30: 40 mg via INTRAVENOUS
  Administered 2015-04-30 (×2): 20 mg via INTRAVENOUS
  Administered 2015-04-30: 30 mg via INTRAVENOUS

## 2015-04-30 SURGICAL SUPPLY — 21 items
CANISTER SUCT 1200ML W/VALVE (MISCELLANEOUS) ×3 IMPLANT
CLIP HMST 235XBRD CATH ROT (MISCELLANEOUS) IMPLANT
CLIP RESOLUTION 360 11X235 (MISCELLANEOUS)
FCP ESCP3.2XJMB 240X2.8X (MISCELLANEOUS)
FORCEPS BIOP RAD 4 LRG CAP 4 (CUTTING FORCEPS) IMPLANT
FORCEPS BIOP RJ4 240 W/NDL (MISCELLANEOUS)
FORCEPS ESCP3.2XJMB 240X2.8X (MISCELLANEOUS) IMPLANT
GOWN CVR UNV OPN BCK APRN NK (MISCELLANEOUS) ×4 IMPLANT
GOWN ISOL THUMB LOOP REG UNIV (MISCELLANEOUS) ×2
INJECTOR VARIJECT VIN23 (MISCELLANEOUS) IMPLANT
KIT DEFENDO VALVE AND CONN (KITS) IMPLANT
KIT ENDO PROCEDURE OLY (KITS) ×3 IMPLANT
MARKER SPOT ENDO TATTOO 5ML (MISCELLANEOUS) IMPLANT
PAD GROUND ADULT SPLIT (MISCELLANEOUS) IMPLANT
PROBE APC STR FIRE (PROBE) ×3 IMPLANT
SNARE SHORT THROW 13M SML OVAL (MISCELLANEOUS) ×3 IMPLANT
SNARE SHORT THROW 30M LRG OVAL (MISCELLANEOUS) IMPLANT
SPOT EX ENDOSCOPIC TATTOO (MISCELLANEOUS)
VARIJECT INJECTOR VIN23 (MISCELLANEOUS)
WATER STERILE IRR 250ML POUR (IV SOLUTION) ×3 IMPLANT
WIDE-EYE POLYPTRAP (MISCELLANEOUS) ×3 IMPLANT

## 2015-04-30 NOTE — Transfer of Care (Signed)
Immediate Anesthesia Transfer of Care Note  Patient: Katrina [facility]  Procedure(s) Performed: Procedure(s): COLONOSCOPY WITH PROPOFOL (N/A) POLYPECTOMY  Patient Location: PACU  Anesthesia Type: MAC  Level of Consciousness: awake, alert  and patient cooperative  Airway and Oxygen Therapy: Patient Spontanous Breathing and Patient connected to supplemental oxygen  Post-op Assessment: Post-op Vital signs reviewed, Patient's Cardiovascular Status Stable, Respiratory Function Stable, Patent Airway and No signs of Nausea or vomiting  Post-op Vital Signs: Reviewed and stable  Complications: No apparent anesthesia complications

## 2015-04-30 NOTE — Anesthesia Preprocedure Evaluation (Signed)
Anesthesia Evaluation  Patient identified by MRN, date of birth, ID band  Reviewed: Allergy & Precautions, H&P , NPO status , Patient's Chart, lab work & pertinent test results  Airway Mallampati: II  TM Distance: >3 FB Neck ROM: full    Dental no notable dental hx.    Pulmonary shortness of breath,    Pulmonary exam normal        Cardiovascular hypertension,  Rhythm:regular Rate:Normal     Neuro/Psych    GI/Hepatic   Endo/Other  Morbid obesity  Renal/GU      Musculoskeletal   Abdominal   Peds  Hematology   Anesthesia Other Findings   Reproductive/Obstetrics                             Anesthesia Physical Anesthesia Plan  ASA: II  Anesthesia Plan: MAC   Post-op Pain Management:    Induction:   Airway Management Planned:   Additional Equipment:   Intra-op Plan:   Post-operative Plan:   Informed Consent: I have reviewed the patients History and Physical, chart, labs and discussed the procedure including the risks, benefits and alternatives for the proposed anesthesia with the patient or authorized representative who has indicated his/her understanding and acceptance.     Plan Discussed with: CRNA  Anesthesia Plan Comments:         Anesthesia Quick Evaluation

## 2015-04-30 NOTE — H&P (Signed)
[facility]  [address], [place], [place] [address] Phone: [phone] Fax : [phone]  Primary Care Physician:  [name], MD Primary Gastroenterologist:  Dr. Allen Norris  Pre-Procedure History & Physical: HPI:  Katrina [facility]man is a 60 y.o. female is here for a screening colonoscopy.   Past Medical History  Diagnosis Date  . History of chicken pox   . Depression   . Shortness of breath dyspnea     "out of shape"  . Scoliosis     sees chiropracter monthly  . Hypertension   . Arthritis     hands, knees, back  . Edema     left leg knee to foot, since veein stripping    Past Surgical History  Procedure Laterality Date  . Cesarean section  1983 1985   . Tubal ligation  [place]  . Vein ligation and stripping  2000  . Bunionectomy  2000  . Knee surgery Left 2009    x 2-meniscus, debridement  . Tonsillectomy and adenoidectomy  2007  . Breast surgery  1999    breast biopsy  . Dilation and curettage of uterus      x2 last one 1/17  . Colonoscopy    . Joint replacement      LTKA-3 yrs ago  . Lasik      Prior to Admission medications   Medication Sig Start Date End Date Taking? Authorizing Provider  ALPRAZolam [name]) 0.5 MG tablet Take 0.5 mg by mouth daily as needed for anxiety.   Yes Historical Provider, MD  citalopram (CELEXA) 20 MG tablet Take 20 mg by mouth daily.     Yes Historical Provider, MD  hydrochlorothiazide (HYDRODIURIL) 25 MG tablet Take 25 mg by mouth daily.   Yes Historical Provider, MD  meloxicam (MOBIC) 15 MG tablet Take 15 mg by mouth daily.   Yes Historical Provider, MD  phentermine 37.5 MG capsule Take 37.5 mg by mouth every morning.   Yes Historical Provider, MD  polyethylene glycol (GOLYTELY) 236 g solution Take 4,000 mLs by mouth once. Drink one 8 oz glass every 30 mins until stools are clear. 04/26/15  Yes [name], MD  trolamine salicylate (ASPERCREME) 10 % cream Apply 1 application topically 2 (two) times daily as  needed for muscle pain.   Yes Historical Provider, MD    Allergies as of 04/13/2015  . (No Known Allergies)    Family History  Problem Relation Age of Onset  . Arthritis Mother   . Hypertension Mother   . Arthritis Father   . Cancer Father     prostate CA  . Hypertension Father   . Diabetes Brother   . Cancer Paternal Aunt     breast cancer  . Arthritis Maternal Grandmother   . Hypertension Maternal Grandmother   . Arthritis Maternal Grandfather   . Hypertension Maternal Grandfather   . Diabetes Maternal Grandfather     Social History   Social History  . Marital Status: Divorced    Spouse Name: N/A  . Number of Children: N/A  . Years of Education: N/A   Occupational History  . Not on file.   Social History Main Topics  . Smoking status: Never Smoker   . Smokeless tobacco: Never Used  . Alcohol Use: Yes     Comment: rare  . Drug Use: No  . Sexual Activity: No   Other Topics Concern  . Not on file   Social History Narrative    Review  of Systems: See HPI, otherwise negative ROS  Physical Exam: BP 137/88 mmHg  Pulse 94  Temp(Src) 97.9 F (36.6 C)  Resp 16  Ht 5\' 6"  (1.676 m)  Wt 274 lb (124.286 kg)  BMI 44.25 kg/m2  SpO2 96% General:   Alert,  pleasant and cooperative in NAD Head:  Normocephalic and atraumatic. Neck:  Supple; no masses or thyromegaly. Lungs:  Clear throughout to auscultation.    Heart:  Regular rate and rhythm. Abdomen:  Soft, nontender and nondistended. Normal bowel sounds, without guarding, and without rebound.   Neurologic:  Alert and  oriented x4;  grossly normal neurologically.  Impression/Plan: Katrina Huffman is now here to undergo a screening colonoscopy.  Risks, benefits, and alternatives regarding colonoscopy have been reviewed with the patient.  Questions have been answered.  All parties agreeable.

## 2015-04-30 NOTE — Anesthesia Postprocedure Evaluation (Signed)
Anesthesia Post Note  Patient: Katrina Huffman  Procedure(s) Performed: Procedure(s) (LRB): COLONOSCOPY WITH PROPOFOL (N/A) POLYPECTOMY  Patient location during evaluation: PACU Anesthesia Type: MAC Level of consciousness: awake and alert and oriented Pain management: satisfactory to patient Vital Signs Assessment: post-procedure vital signs reviewed and stable Respiratory status: spontaneous breathing, nonlabored ventilation and respiratory function stable Cardiovascular status: blood pressure returned to baseline and stable Postop Assessment: Adequate PO intake and No signs of nausea or vomiting Anesthetic complications: no    Raliegh Ip

## 2015-04-30 NOTE — Op Note (Signed)
[facility] Patient Name: Katrina Huffman Procedure Date: 04/30/2015 9:06 AM MRN: [id number] Account #: [id number] Date of Birth: November 18, 1955 Admit Type: Outpatient Age: 60 Room: [facility] OR ROOM 01 Gender: Female Note Status: Finalized Procedure:            Colonoscopy Indications:          Screening for colorectal malignant neoplasm Providers:            [name], MD Referring MD:         [name]. [place] (Referring MD) Medicines:            Propofol per Anesthesia Complications:        No immediate complications. Procedure:            Pre-Anesthesia Assessment:                       - Prior to the procedure, a History and Physical was                        performed, and patient medications and allergies were                        reviewed. The patient's tolerance of previous                        anesthesia was also reviewed. The risks and benefits of                        the procedure and the sedation options and risks were                        discussed with the patient. All questions were                        answered, and informed consent was obtained. Prior                        Anticoagulants: The patient has taken no previous                        anticoagulant or antiplatelet agents. ASA Grade                        Assessment: II - A patient with mild systemic disease.                        After reviewing the risks and benefits, the patient was                        deemed in satisfactory condition to undergo the                        procedure.                       After obtaining informed consent, the colonoscope was                        passed under direct vision. Throughout the procedure,  the patient's blood pressure, pulse, and oxygen                        saturations were monitored continuously. The was                        introduced through the anus and advanced to the the          cecum, identified by appendiceal orifice and ileocecal                        valve. The colonoscopy was performed without                        difficulty. The patient tolerated the procedure well.                        The quality of the bowel preparation was excellent. Findings:      The perianal and digital rectal examinations were normal.      A 6 mm polyp was found in the transverse colon. The polyp was sessile.       The polyp was removed with a cold snare. Resection and retrieval were       complete.      Non-bleeding internal hemorrhoids were found during retroflexion. The       hemorrhoids were Grade II (internal hemorrhoids that prolapse but reduce       spontaneously). Impression:           - One 6 mm polyp in the transverse colon, removed with                        a cold snare. Resected and retrieved.                       - Non-bleeding internal hemorrhoids. Recommendation:       - Await pathology results.                       - Repeat colonoscopy in 5 years if polyp adenoma and 10                        years if hyperplastic Procedure Code(s):    --- Professional ---                       [phone], Colonoscopy, flexible; with removal of tumor(s),                        polyp(s), or other lesion(s) by snare technique Diagnosis Code(s):    --- Professional ---                       Z12.11, Encounter for screening for malignant neoplasm                        of colon                       D12.3, Benign neoplasm of transverse colon (hepatic                        flexure or splenic flexure)  CPT copyright 2016 American Medical Association. All rights reserved. The codes documented in this report are preliminary and upon coder review may  be revised to meet current compliance requirements. [name], MD 04/30/2015 9:30:31 AM This report has been signed electronically. Number of Addenda: 0 Note Initiated On: 04/30/2015 9:06 AM Scope Withdrawal Time: 0 hours 7  minutes 29 seconds  Total Procedure Duration: 0 hours 10 minutes 34 seconds       [name]

## 2015-04-30 NOTE — Anesthesia Procedure Notes (Signed)
Procedure Name: MAC Date/Time: 04/30/2015 9:13 AM Performed by: Cameron Ali Pre-anesthesia Checklist: Patient identified, Emergency Drugs available, Suction available, Timeout performed and Patient being monitored Patient Re-evaluated:Patient Re-evaluated prior to inductionOxygen Delivery Method: Nasal cannula Placement Confirmation: positive ETCO2

## 2015-05-01 ENCOUNTER — Encounter: Payer: Self-pay | Admitting: Gastroenterology

## 2015-05-02 ENCOUNTER — Encounter: Payer: Self-pay | Admitting: Gastroenterology

## 2015-05-08 MED ORDER — DEXTROSE 5 % IV SOLN
3.0000 g | INTRAVENOUS | Status: AC
  Administered 2015-05-09: 3 g via INTRAVENOUS
  Filled 2015-05-08: qty 3000

## 2015-05-09 ENCOUNTER — Encounter (HOSPITAL_COMMUNITY): Payer: Self-pay | Admitting: Certified Registered"

## 2015-05-09 ENCOUNTER — Ambulatory Visit (HOSPITAL_COMMUNITY): Payer: BLUE CROSS/BLUE SHIELD | Admitting: Certified Registered"

## 2015-05-09 ENCOUNTER — Ambulatory Visit (HOSPITAL_COMMUNITY)
Admission: RE | Admit: 2015-05-09 | Discharge: 2015-05-09 | Disposition: A | Discharge status: Home / Self Care | Payer: BLUE CROSS/BLUE SHIELD | Source: Ambulatory Visit | Attending: Orthopedic Surgery | Admitting: Orthopedic Surgery

## 2015-05-09 ENCOUNTER — Encounter (HOSPITAL_COMMUNITY)
Admission: RE | Disposition: A | Discharge status: Home / Self Care | Payer: Self-pay | Source: Ambulatory Visit | Attending: Orthopedic Surgery

## 2015-05-09 DIAGNOSIS — M159 Polyosteoarthritis, unspecified: Secondary | ICD-10-CM | POA: Insufficient documentation

## 2015-05-09 DIAGNOSIS — Z79899 Other long term (current) drug therapy: Secondary | ICD-10-CM | POA: Diagnosis not present

## 2015-05-09 DIAGNOSIS — I10 Essential (primary) hypertension: Secondary | ICD-10-CM | POA: Insufficient documentation

## 2015-05-09 DIAGNOSIS — F329 Major depressive disorder, single episode, unspecified: Secondary | ICD-10-CM | POA: Diagnosis not present

## 2015-05-09 DIAGNOSIS — Z96652 Presence of left artificial knee joint: Secondary | ICD-10-CM | POA: Diagnosis not present

## 2015-05-09 DIAGNOSIS — Z96659 Presence of unspecified artificial knee joint: Secondary | ICD-10-CM

## 2015-05-09 DIAGNOSIS — Z791 Long term (current) use of non-steroidal anti-inflammatories (NSAID): Secondary | ICD-10-CM | POA: Insufficient documentation

## 2015-05-09 DIAGNOSIS — M228X2 Other disorders of patella, left knee: Secondary | ICD-10-CM | POA: Diagnosis not present

## 2015-05-09 DIAGNOSIS — M25862 Other specified joint disorders, left knee: Secondary | ICD-10-CM | POA: Diagnosis not present

## 2015-05-09 DIAGNOSIS — Z6841 Body Mass Index (BMI) 40.0 and over, adult: Secondary | ICD-10-CM | POA: Diagnosis not present

## 2015-05-09 DIAGNOSIS — M25869 Other specified joint disorders, unspecified knee: Secondary | ICD-10-CM

## 2015-05-09 HISTORY — PX: KNEE ARTHROSCOPY: SHX127

## 2015-05-09 SURGERY — ARTHROSCOPY, KNEE
Anesthesia: General | Site: Knee | Laterality: Left

## 2015-05-09 MED ORDER — ACETAMINOPHEN 160 MG/5ML PO SOLN
325.0000 mg | ORAL | Status: DC | PRN
Start: 2015-05-09 — End: 2015-05-09

## 2015-05-09 MED ORDER — HYDROMORPHONE HCL 1 MG/ML IJ SOLN
INTRAMUSCULAR | Status: AC
  Filled 2015-05-09: qty 1

## 2015-05-09 MED ORDER — ACETAMINOPHEN 10 MG/ML IV SOLN
INTRAVENOUS | Status: AC
  Filled 2015-05-09: qty 100

## 2015-05-09 MED ORDER — ONDANSETRON HCL 4 MG/2ML IJ SOLN: 4.0000 mg | Freq: Once | INTRAMUSCULAR | Status: DC | PRN

## 2015-05-09 MED ORDER — MIDAZOLAM HCL 2 MG/2ML IJ SOLN
INTRAMUSCULAR | Status: AC
  Filled 2015-05-09: qty 2

## 2015-05-09 MED ORDER — LIDOCAINE HCL (CARDIAC) 20 MG/ML IV SOLN
INTRAVENOUS | Status: AC
  Filled 2015-05-09: qty 5

## 2015-05-09 MED ORDER — ONDANSETRON HCL 4 MG/2ML IJ SOLN
INTRAMUSCULAR | Status: DC | PRN
  Administered 2015-05-09: 4 mg via INTRAVENOUS

## 2015-05-09 MED ORDER — PROPOFOL 10 MG/ML IV BOLUS
INTRAVENOUS | Status: AC
  Filled 2015-05-09: qty 20

## 2015-05-09 MED ORDER — FENTANYL CITRATE (PF) 100 MCG/2ML IJ SOLN
INTRAMUSCULAR | Status: DC | PRN
  Administered 2015-05-09: 25 ug via INTRAVENOUS
  Administered 2015-05-09: 50 ug via INTRAVENOUS
  Administered 2015-05-09: 25 ug via INTRAVENOUS

## 2015-05-09 MED ORDER — CHLORHEXIDINE GLUCONATE 4 % EX LIQD: 60.0000 mL | Freq: Once | CUTANEOUS | Status: DC

## 2015-05-09 MED ORDER — METHOCARBAMOL 1000 MG/10ML IJ SOLN
500.0000 mg | Freq: Once | INTRAVENOUS | Status: AC
  Administered 2015-05-09: 500 mg via INTRAVENOUS
  Filled 2015-05-09: qty 5

## 2015-05-09 MED ORDER — ACETAMINOPHEN 325 MG PO TABS: 325.0000 mg | ORAL_TABLET | ORAL | Status: DC | PRN

## 2015-05-09 MED ORDER — LACTATED RINGERS IR SOLN
Status: DC | PRN
  Administered 2015-05-09: 9000 mL

## 2015-05-09 MED ORDER — BUPIVACAINE-EPINEPHRINE 0.25% -1:200000 IJ SOLN
INTRAMUSCULAR | Status: DC | PRN
  Administered 2015-05-09: 20 mL

## 2015-05-09 MED ORDER — LACTATED RINGERS IV SOLN: INTRAVENOUS | Status: DC

## 2015-05-09 MED ORDER — DEXAMETHASONE SODIUM PHOSPHATE 10 MG/ML IJ SOLN
INTRAMUSCULAR | Status: AC
  Filled 2015-05-09: qty 1

## 2015-05-09 MED ORDER — LIDOCAINE HCL (CARDIAC) 20 MG/ML IV SOLN
INTRAVENOUS | Status: DC | PRN
  Administered 2015-05-09: 20 mg via INTRAVENOUS

## 2015-05-09 MED ORDER — MIDAZOLAM HCL 5 MG/5ML IJ SOLN
INTRAMUSCULAR | Status: DC | PRN
  Administered 2015-05-09: 2 mg via INTRAVENOUS

## 2015-05-09 MED ORDER — ACETAMINOPHEN 10 MG/ML IV SOLN
1000.0000 mg | Freq: Once | INTRAVENOUS | Status: DC
  Filled 2015-05-09: qty 100

## 2015-05-09 MED ORDER — SODIUM CHLORIDE 0.9 % IV SOLN: INTRAVENOUS | Status: DC

## 2015-05-09 MED ORDER — HYDROMORPHONE HCL 1 MG/ML IJ SOLN
0.5000 mg | INTRAMUSCULAR | Status: DC | PRN
  Administered 2015-05-09 (×2): 0.5 mg via INTRAVENOUS

## 2015-05-09 MED ORDER — DEXAMETHASONE SODIUM PHOSPHATE 10 MG/ML IJ SOLN
10.0000 mg | Freq: Once | INTRAMUSCULAR | Status: AC
  Administered 2015-05-09: 10 mg via INTRAVENOUS

## 2015-05-09 MED ORDER — BUPIVACAINE-EPINEPHRINE (PF) 0.25% -1:200000 IJ SOLN
INTRAMUSCULAR | Status: AC
  Filled 2015-05-09: qty 30

## 2015-05-09 MED ORDER — HYDROCODONE-ACETAMINOPHEN 5-325 MG PO TABS: 1.0000 | ORAL_TABLET | ORAL | Status: DC | PRN

## 2015-05-09 MED ORDER — PROPOFOL 10 MG/ML IV BOLUS
INTRAVENOUS | Status: DC | PRN
  Administered 2015-05-09: 50 mg via INTRAVENOUS
  Administered 2015-05-09: 150 mg via INTRAVENOUS
  Administered 2015-05-09: 50 mg via INTRAVENOUS

## 2015-05-09 MED ORDER — SUCCINYLCHOLINE CHLORIDE 20 MG/ML IJ SOLN
INTRAMUSCULAR | Status: DC | PRN
  Administered 2015-05-09: 80 mg via INTRAVENOUS

## 2015-05-09 MED ORDER — HYDROMORPHONE HCL 1 MG/ML IJ SOLN: 0.5000 mg | INTRAMUSCULAR | Status: DC | PRN

## 2015-05-09 MED ORDER — LACTATED RINGERS IV SOLN
INTRAVENOUS | Status: DC | PRN
  Administered 2015-05-09: 10:00:00 via INTRAVENOUS

## 2015-05-09 MED ORDER — POVIDONE-IODINE 10 % EX SWAB: 2.0000 "application " | Freq: Once | CUTANEOUS | Status: DC

## 2015-05-09 MED ORDER — FENTANYL CITRATE (PF) 100 MCG/2ML IJ SOLN
INTRAMUSCULAR | Status: AC
  Filled 2015-05-09: qty 2

## 2015-05-09 MED ORDER — METHOCARBAMOL 500 MG PO TABS: 500.0000 mg | ORAL_TABLET | Freq: Four times a day (QID) | ORAL | Status: DC

## 2015-05-09 MED ORDER — ONDANSETRON HCL 4 MG/2ML IJ SOLN
INTRAMUSCULAR | Status: AC
  Filled 2015-05-09: qty 2

## 2015-05-09 MED ORDER — ACETAMINOPHEN 10 MG/ML IV SOLN
INTRAVENOUS | Status: DC | PRN
  Administered 2015-05-09: 1000 mg via INTRAVENOUS

## 2015-05-09 SURGICAL SUPPLY — 30 items
BANDAGE ACE 6X5 VEL STRL LF (GAUZE/BANDAGES/DRESSINGS) ×2 IMPLANT
BLADE 4.2CUDA (BLADE) ×2 IMPLANT
COVER SURGICAL LIGHT HANDLE (MISCELLANEOUS) ×2 IMPLANT
CUFF TOURN SGL QUICK 34 (TOURNIQUET CUFF)
CUFF TOURN SGL QUICK 44 (TOURNIQUET CUFF) ×2 IMPLANT
CUFF TRNQT CYL 34X4X40X1 (TOURNIQUET CUFF) IMPLANT
DRAPE U-SHAPE 47X51 STRL (DRAPES) ×2 IMPLANT
DRSG EMULSION OIL 3X3 NADH (GAUZE/BANDAGES/DRESSINGS) ×2 IMPLANT
DRSG PAD ABDOMINAL 8X10 ST (GAUZE/BANDAGES/DRESSINGS) ×2 IMPLANT
DURAPREP 26ML APPLICATOR (WOUND CARE) ×2 IMPLANT
GAUZE SPONGE 4X4 12PLY STRL (GAUZE/BANDAGES/DRESSINGS) ×2 IMPLANT
GLOVE BIO SURGEON STRL SZ8 (GLOVE) ×2 IMPLANT
GLOVE BIOGEL PI IND STRL 8 (GLOVE) ×1 IMPLANT
GLOVE BIOGEL PI INDICATOR 8 (GLOVE) ×1
GOWN STRL REUS W/TWL LRG LVL3 (GOWN DISPOSABLE) ×2 IMPLANT
KIT BASIN OR (CUSTOM PROCEDURE TRAY) ×2 IMPLANT
MANIFOLD NEPTUNE II (INSTRUMENTS) ×2 IMPLANT
MARKER SKIN DUAL TIP RULER LAB (MISCELLANEOUS) IMPLANT
PACK ARTHROSCOPY WL (CUSTOM PROCEDURE TRAY) ×2 IMPLANT
PACK ICE MAXI GEL EZY WRAP (MISCELLANEOUS) ×6 IMPLANT
PAD ABD 8X10 STRL (GAUZE/BANDAGES/DRESSINGS) ×2 IMPLANT
PADDING CAST ABS 6INX4YD NS (CAST SUPPLIES) ×1
PADDING CAST ABS COTTON 6X4 NS (CAST SUPPLIES) ×1 IMPLANT
PADDING CAST COTTON 6X4 STRL (CAST SUPPLIES) ×4 IMPLANT
POSITIONER SURGICAL ARM (MISCELLANEOUS) ×2 IMPLANT
SUT ETHILON 4 0 PS 2 18 (SUTURE) ×2 IMPLANT
TOWEL OR 17X26 10 PK STRL BLUE (TOWEL DISPOSABLE) ×2 IMPLANT
TUBING ARTHRO INFLOW-ONLY STRL (TUBING) ×2 IMPLANT
WAND HAND CNTRL MULTIVAC 90 (MISCELLANEOUS) ×2 IMPLANT
WRAP KNEE MAXI GEL POST OP (GAUZE/BANDAGES/DRESSINGS) ×2 IMPLANT

## 2015-05-09 NOTE — H&P (Signed)
CC- Katrina Huffman is a 60 y.o. female who presents with left knee pain.  HPI- . Knee Pain: Patient presents with knee pain involving the  left knee. Onset of the symptoms was several years ago. Inciting event: She had a Left Total Knee Arthroplasty in [facility] approximately 3 years ago and has painful popping in her knee consistent with patellar clunk syndrome.. Current symptoms include popping sensation. Pain is aggravated by going up and down stairs and rising after sitting.  Patient has had prior knee problems. Evaluation to date: plain films: normal. Treatment to date: none.  Past Medical History  Diagnosis Date  . History of chicken pox   . Depression   . Shortness of breath dyspnea     "out of shape"  . Scoliosis     sees chiropracter monthly  . Hypertension   . Arthritis     hands, knees, back  . Edema     left leg knee to foot, since veein stripping    Past Surgical History  Procedure Laterality Date  . Cesarean section  1983 1985   . Tubal ligation  [place]  . Vein ligation and stripping  2000  . Bunionectomy  2000  . Knee surgery Left 2009    x 2-meniscus, debridement  . Tonsillectomy and adenoidectomy  2007  . Breast surgery  1999    breast biopsy  . Dilation and curettage of uterus      x2 last one 1/17  . Colonoscopy    . Joint replacement      LTKA-3 yrs ago  . Lasik    . Colonoscopy with propofol N/A 04/30/2015    Procedure: COLONOSCOPY WITH PROPOFOL;  Surgeon: [name], MD;  Location: [place];  Service: Endoscopy;  Laterality: N/A;  . Polypectomy  04/30/2015    Procedure: POLYPECTOMY;  Surgeon: [name], MD;  Location: [place];  Service: Endoscopy;;    Prior to Admission medications   Medication Sig Start Date End Date Taking? Authorizing Provider  ALPRAZolam [name]) 0.5 MG tablet Take 0.5 mg by mouth daily as needed for anxiety.   Yes Historical Provider, MD  citalopram (CELEXA) 20 MG tablet Take 20 mg by mouth daily.      Yes Historical Provider, MD  hydrochlorothiazide (HYDRODIURIL) 25 MG tablet Take 25 mg by mouth daily.   Yes Historical Provider, MD  meloxicam (MOBIC) 15 MG tablet Take 15 mg by mouth daily.   Yes Historical Provider, MD  trolamine salicylate (ASPERCREME) 10 % cream Apply 1 application topically 2 (two) times daily as needed for muscle pain.   Yes Historical Provider, MD  phentermine 37.5 MG capsule Take 37.5 mg by mouth every morning.    Historical Provider, MD  polyethylene glycol (GOLYTELY) 236 g solution Take 4,000 mLs by mouth once. Drink one 8 oz glass every 30 mins until stools are clear. 04/26/15   [name], MD   KNEE EXAM antalgic gait, no effusion, crepitus on range of motion especially with getting up out of a chair and going up stairs, no tenderness or instability  Physical Examination: General appearance - alert, well appearing, and in no distress Mental status - alert, oriented to person, place, and time Chest - clear to auscultation, no wheezes, rales or rhonchi, symmetric air entry Heart - normal rate, regular rhythm, normal S1, S2, no murmurs, rubs, clicks or gallops Abdomen - soft, nontender, nondistended, no masses or organomegaly Neurological - alert, oriented, normal speech, no focal findings  or movement disorder noted   Asessment/Plan--- Left knee patellar clunk syndrome- - Plan left knee arthroscopy with synovectomy. Procedure risks and potential comps discussed with patient who elects to proceed. Goals are decreased pain and increased function with a high likelihood of achieving both

## 2015-05-09 NOTE — Anesthesia Procedure Notes (Signed)
Procedure Name: LMA Insertion Date/Time: 05/09/2015 10:10 AM Performed by: Lajuana Carry E Pre-anesthesia Checklist: Patient identified, Emergency Drugs available, Suction available, Patient being monitored and Timeout performed Patient Re-evaluated:Patient Re-evaluated prior to inductionOxygen Delivery Method: Circle system utilized Preoxygenation: Pre-oxygenation with 100% oxygen Intubation Type: IV induction Ventilation: Mask ventilation without difficulty and Oral airway inserted - appropriate to patient size LMA: LMA inserted LMA Size: 4.0 Number of attempts: 2 Placement Confirmation: positive ETCO2 and breath sounds checked- equal and bilateral Tube secured with: Tape Comments: Initial LMA inserted at 1003, ETCO2 noted, patient bit down on LMA, occluding LMA, questionable laryngospasm noted. Sucs given and oral airway placed, mask ventilated with ease, LMA reinserted with O2 sats in 90's. Dr. Tamala Julian at bedside throughout.

## 2015-05-09 NOTE — Brief Op Note (Signed)
05/09/2015  10:47 AM  PATIENT:  Katrina Huffman  60 y.o. female  PRE-OPERATIVE DIAGNOSIS:  left knee patella clunk syndrome  POST-OPERATIVE DIAGNOSIS:  left knee patella clunk syndrome  PROCEDURE:  Procedure(s): ARTHROSCOPY LEFT KNEE WITH SYNOVECTOMY (Left)  SURGEON:  Surgeon(s) and Role:    * [name], MD - Primary  PHYSICIAN ASSISTANT:   ASSISTANTS: none   ANESTHESIA:   general  EBL:  Total I/O In: -  Out: 5 [Blood:5]  BLOOD ADMINISTERED:none  DRAINS: none   LOCAL MEDICATIONS USED:  MARCAINE     COUNTS:  YES  TOURNIQUET:    DICTATION: .Other Dictation: Dictation Number [id number]  PLAN OF CARE: Discharge to [facility] after PACU  PATIENT DISPOSITION:  PACU - hemodynamically stable.

## 2015-05-09 NOTE — Transfer of Care (Signed)
Immediate Anesthesia Transfer of Care Note  Patient: Katrina Huffman  Procedure(s) Performed: Procedure(s): ARTHROSCOPY LEFT KNEE WITH SYNOVECTOMY (Left)  Patient Location: PACU  Anesthesia Type:General  Level of Consciousness:  sedated, patient cooperative and responds to stimulation  Airway & Oxygen Therapy:Patient Spontanous Breathing and Patient connected to face mask oxgen  Post-op Assessment:  Report given to PACU RN and Post -op Vital signs reviewed and stable  Post vital signs:  Reviewed and stable  Last Vitals:  Filed Vitals:   05/09/15 0841  BP: 137/80  Pulse: 85  Temp: 36.7 C  Resp: 18    Complications: No apparent anesthesia complications

## 2015-05-09 NOTE — Interval H&P Note (Signed)
History and Physical Interval Note:  05/09/2015 9:53 AM  [name]  has presented today for surgery, with the diagnosis of left knee patella clunk syndrome  The various methods of treatment have been discussed with the patient and family. After consideration of risks, benefits and other options for treatment, the patient has consented to  Procedure(s): ARTHROSCOPY LEFT KNEE WITH SYNOVECTOMY (Left) as a surgical intervention .  The patient's history has been reviewed, patient examined, no change in status, stable for surgery.  I have reviewed the patient's chart and labs.  Questions were answered to the patient's satisfaction.     Katrina Huffman

## 2015-05-09 NOTE — Anesthesia Postprocedure Evaluation (Signed)
Anesthesia Post Note  Patient: Katrina Huffman  Procedure(s) Performed: Procedure(s) (LRB): ARTHROSCOPY LEFT KNEE WITH SYNOVECTOMY (Left)  Patient location during evaluation: PACU Anesthesia Type: General Level of consciousness: awake and alert Pain management: pain level controlled Vital Signs Assessment: post-procedure vital signs reviewed and stable Respiratory status: spontaneous breathing, nonlabored ventilation, respiratory function stable and patient connected to nasal cannula oxygen Cardiovascular status: blood pressure returned to baseline and stable Postop Assessment: no signs of nausea or vomiting Anesthetic complications: no    Last Vitals:  Filed Vitals:   05/09/15 1130 05/09/15 1145  BP: 134/53 128/78  Pulse: 72 72  Temp: 36.7 C 36.5 C  Resp: 16 15    Last Pain:  Filed Vitals:   05/09/15 1155  PainSc: 2                  Alexis Frock

## 2015-05-09 NOTE — Discharge Instructions (Signed)
° °Dr. Janiya Millirons °Total Joint Specialist °[facility] °[address] °, [place] [address] °[phone] ° ° °Arthroscopic Procedure, Knee °An arthroscopic procedure can find what is wrong with your knee. °PROCEDURE °Arthroscopy is a surgical technique that allows your orthopedic surgeon to diagnose and treat your knee injury with accuracy. They will look into your knee through a small instrument. This is almost like a small (pencil sized) telescope. Because arthroscopy affects your knee less than open knee surgery, you can anticipate a more rapid recovery. Taking an active role by following your caregiver's instructions will help with rapid and complete recovery. Use crutches, rest, elevation, ice, and knee exercises as instructed. The length of recovery depends on various factors including type of injury, age, physical condition, medical conditions, and your rehabilitation. °Your knee is the joint between the large bones (femur and tibia) in your leg. Cartilage covers these bone ends which are smooth and slippery and allow your knee to bend and move smoothly. Two menisci, thick, semi-lunar shaped pads of cartilage which form a rim inside the joint, help absorb shock and stabilize your knee. Ligaments bind the bones together and support your knee joint. Muscles move the joint, help support your knee, and take stress off the joint itself. Because of this all programs and physical therapy to rehabilitate an injured or repaired knee require rebuilding and strengthening your muscles. °AFTER THE PROCEDURE °· After the procedure, you will be moved to a recovery area until most of the effects of the medication have worn off. Your caregiver will discuss the test results with you.  °· Only take over-the-counter or prescription medicines for pain, discomfort, or fever as directed by your caregiver.  °SEEK MEDICAL CARE IF:  °· You have increased bleeding from your wounds.  °· You see  redness, swelling, or have increasing pain in your wounds.  °· You have pus coming from your wound.  °· You have an oral temperature above 102° F (38.9° C).  °· You notice a bad smell coming from the wound or dressing.  °· You have severe pain with any motion of your knee.  °SEEK IMMEDIATE MEDICAL CARE IF:  °· You develop a rash.  °· You have difficulty breathing.  °· You have any allergic problems.  °FURTHER INSTRUCTIONS:  °· ICE to the affected knee every three hours for 30 minutes at a time and then as needed for pain and swelling.  Continue to use ice on the knee for pain and swelling from surgery. You may notice swelling that will progress down to the foot and ankle.  This is normal after surgery.  Elevate the leg when you are not up walking on it.   ° °DIET °You may resume your previous home diet once your are discharged from the hospital. ° °DRESSING / WOUND CARE / SHOWERING °You may start showering two days after being discharged home but do not submerge the incisions under water.  °Change dressing 48 hours after the procedure and then cover the small incisions with band aids until your follow up visit. °Change the surgical dressings daily and reapply a dry dressing each time.  ° °ACTIVITY °Walk with your walker as instructed. °Use walker as long as suggested by your caregivers. °Avoid periods of inactivity such as sitting longer than an hour when not asleep. This helps prevent blood clots.  °You may resume a sexual relationship in one month or when given the OK by your doctor.  °You may return to   work once you are cleared by your doctor.  °Do not drive a car for 6 weeks or until released by you surgeon.  °Do not drive while taking narcotics. ° °WEIGHT BEARING AS TOLERATED ° °POSTOPERATIVE CONSTIPATION PROTOCOL °Constipation - defined medically as fewer than three stools per week and severe constipation as less than one stool per week. ° °One of the most common issues patients have following surgery is  constipation.  Even if you have a regular bowel pattern at home, your normal regimen is likely to be disrupted due to multiple reasons following surgery.  Combination of anesthesia, postoperative narcotics, change in appetite and fluid intake all can affect your bowels.  In order to avoid complications following surgery, here are some recommendations in order to help you during your recovery period. ° °Colace (docusate) - Pick up an over-the-counter form of Colace or another stool softener and take twice a day as long as you are requiring postoperative pain medications.  Take with a full glass of water daily.  If you experience loose stools or diarrhea, hold the colace until you stool forms back up.  If your symptoms do not get better within 1 week or if they get worse, check with your doctor. ° °Dulcolax (bisacodyl) - Pick up over-the-counter and take as directed by the product packaging as needed to assist with the movement of your bowels.  Take with a full glass of water.  Use this product as needed if not relieved by Colace only.  ° °MiraLax (polyethylene glycol) - Pick up over-the-counter to have on hand.  MiraLax is a solution that will increase the amount of water in your bowels to assist with bowel movements.  Take as directed and can mix with a glass of water, juice, soda, coffee, or tea.  Take if you go more than two days without a movement. °Do not use MiraLax more than once per day. Call your doctor if you are still constipated or irregular after using this medication for 7 days in a row. ° °If you continue to have problems with postoperative constipation, please contact the office for further assistance and recommendations.  If you experience "the worst abdominal pain ever" or develop nausea or vomiting, please contact the office immediatly for further recommendations for treatment. ° °ITCHING ° If you experience itching with your medications, try taking only a single pain pill, or even half a pain pill  at a time.  You can also use Benadryl over the counter for itching or also to help with sleep.  ° °TED HOSE STOCKINGS °Wear the elastic stockings on both legs for three weeks following surgery during the day but you may remove then at night for sleeping. ° °MEDICATIONS °See your medication summary on the “After Visit Summary” that the nursing staff will review with you prior to discharge.  You may have some home medications which will be placed on hold until you complete the course of blood thinner medication.  It is important for you to complete the blood thinner medication as prescribed by your surgeon.  Continue your approved medications as instructed at time of discharge. °Do not drive while taking narcotics.  ° °PRECAUTIONS °If you experience chest pain or shortness of breath - call 911 immediately for transfer to the hospital emergency department.  °If you develop a fever greater that 101 F, purulent drainage from wound, increased redness or drainage from wound, foul odor from the wound/dressing, or calf pain - CONTACT YOUR SURGEON.   °                                                °  FOLLOW-UP APPOINTMENTS °Make sure you keep all of your appointments after your operation with your surgeon and caregivers. You should call the office at [phone]  and make an appointment for approximately one week after the date of your surgery or on the date instructed by your surgeon outlined in the "After Visit Summary". ° °RANGE OF MOTION AND STRENGTHENING EXERCISES  °Rehabilitation of the knee is important following a knee injury or an operation. After just a few days of immobilization, the muscles of the thigh which control the knee become weakened and shrink (atrophy). Knee exercises are designed to build up the tone and strength of the thigh muscles and to improve knee motion. Often times heat used for twenty to thirty minutes before working out will loosen up your tissues and help with improving the range of motion  but do not use heat for the first two weeks following surgery. These exercises can be done on a training (exercise) mat, on the floor, on a table or on a bed. Use what ever works the best and is most comfortable for you Knee exercises include: ° °QUAD STRENGTHENING EXERCISES °Strengthening Quadriceps Sets ° °Tighten muscles on top of thigh by pushing knees down into floor or table. °Hold for 20 seconds. Repeat 10 times. °Do 2 sessions per day. ° ° ° ° °Strengthening Terminal Knee Extension ° °With knee bent over bolster, straighten knee by tightening muscle on top of thigh. Be sure to keep bottom of knee on bolster. °Hold for 20 seconds. Repeat 10 times. °Do 2 sessions per day. ° ° °Straight Leg with Bent Knee ° °Lie on back with opposite leg bent. Keep involved knee slightly bent at knee and raise leg 4-6". Hold for 10 seconds. °Repeat 20 times per set. °Do 2 sets per session. °Do 2 sessions per day. ° °

## 2015-05-09 NOTE — Anesthesia Preprocedure Evaluation (Addendum)
Anesthesia Evaluation  Patient identified by MRN, date of birth, ID band Patient awake    Reviewed: Allergy & Precautions, NPO status , Patient's Chart, lab work & pertinent test results, reviewed documented beta blocker date and time   Airway Mallampati: II  TM Distance: >3 FB Neck ROM: Full    Dental   Pulmonary shortness of breath,    Pulmonary exam normal        Cardiovascular hypertension, Normal cardiovascular exam     Neuro/Psych Depression    GI/Hepatic   Endo/Other  Morbid obesity  Renal/GU      Musculoskeletal  (+) Arthritis ,   Abdominal   Peds  Hematology   Anesthesia Other Findings   Reproductive/Obstetrics                            Anesthesia Physical Anesthesia Plan  ASA: II  Anesthesia Plan: General   Post-op Pain Management:    Induction: Intravenous  Airway Management Planned: LMA and Oral ETT  Additional Equipment:   Intra-op Plan:   Post-operative Plan: Extubation in OR  Informed Consent: I have reviewed the patients History and Physical, chart, labs and discussed the procedure including the risks, benefits and alternatives for the proposed anesthesia with the patient or authorized representative who has indicated his/her understanding and acceptance.     Plan Discussed with: CRNA, Anesthesiologist and Surgeon  Anesthesia Plan Comments:         Anesthesia Quick Evaluation

## 2015-05-10 NOTE — Op Note (Signed)
NAMEGENYSIS, BALLAS NO.:  [id number]  MEDICAL RECORD NO.:  [id number]  LOCATION:  [facility]                         FACILITY:  [facility]  PHYSICIAN:  [name], M.D.    DATE OF BIRTH:  1955-03-11  DATE OF PROCEDURE:  05/09/2015 DATE OF DISCHARGE:  05/09/2015                              OPERATIVE REPORT   PREOPERATIVE DIAGNOSIS:  Left knee patellar clunk syndrome.  POSTOPERATIVE DIAGNOSIS:  Left knee patellar clunk syndrome.  PROCEDURE:  Left knee arthroscopy and synovectomy.  ASSISTANT:  None.  ANESTHESIA:  General.  ESTIMATED BLOOD LOSS:  Minimal.  DRAINS:  None.  COMPLICATIONS:  None.  CONDITION:  Stable to recovery.  BRIEF CLINICAL NOTE:  Ms. Katrina Huffman is a 60 year old female, had a left total knee arthroplasty done in [place] approximately 3 years ago.  She has developed painful popping in the knee consistent with patellar clunk syndrome.  She presents now for left knee arthroscopy and synovectomy.  PROCEDURE IN DETAIL:  After successful administration of general anesthetic, a tourniquet was placed high on her left thigh and left lower extremity prepped and draped in the usual sterile fashion. Standard superomedial and inferolateral incisions were made.  Inflow cannula passed superomedial and camera passed inferolateral. Arthroscopic visualization proceeds.  There was a large amount of tissue at the junction of the quadriceps tendon and superior pole of the patella consistent with that seen in patellar clunk syndrome.  There was also a lot of hypertrophic synovial tissue in the lateral gutter.  A superolateral portal was created with an 11 blade and using a combination of the shaver and ArthroCare device, this tissue was debrided back to normal tissue and sealed off with the ArthroCare.  The medial gutter was also visualized.  There was no abnormal tissue there. Inferiorly, there was some scarring around the patellar tendon and we removed some of  that scar to get back towards normal tissue.  Lateral gutters visualized and I had already debrided the abnormal tissue from there.  The joints again inspected.  No other hypertrophic synovium or abnormal tissue noted.  The arthroscopic equipment was then removed from the lateral portals, which were closed with interrupted 4-0 nylon.  A 20 mL of 0.25% Marcaine with epinephrine was injected through the inflow cannula and that was removed and that portal was closed with nylon.  The incisions were cleaned and dried and a bulky sterile dressing applied.  She was then awakened and transported to recovery in stable condition.     [name], M.D.     FA/MEDQ  D:  05/09/2015  T:  05/10/2015  Job:  [id number]

## 2015-05-21 ENCOUNTER — Telehealth: Payer: Self-pay

## 2015-05-21 ENCOUNTER — Encounter: Payer: Self-pay | Admitting: Gastroenterology

## 2015-05-21 NOTE — Telephone Encounter (Signed)
Please review pathology 

## 2015-07-31 DIAGNOSIS — Z4789 Encounter for other orthopedic aftercare: Secondary | ICD-10-CM | POA: Diagnosis not present

## 2015-07-31 DIAGNOSIS — T8482XD Fibrosis due to internal orthopedic prosthetic devices, implants and grafts, subsequent encounter: Secondary | ICD-10-CM | POA: Diagnosis not present

## 2015-08-05 LAB — HM PAP SMEAR

## 2015-08-05 LAB — HM MAMMOGRAPHY: HM MAMMO: NORMAL (ref 0–4)

## 2015-08-09 DIAGNOSIS — I10 Essential (primary) hypertension: Secondary | ICD-10-CM | POA: Diagnosis not present

## 2015-08-09 DIAGNOSIS — Z01419 Encounter for gynecological examination (general) (routine) without abnormal findings: Secondary | ICD-10-CM | POA: Diagnosis not present

## 2015-08-09 DIAGNOSIS — Z124 Encounter for screening for malignant neoplasm of cervix: Secondary | ICD-10-CM | POA: Diagnosis not present

## 2015-08-09 DIAGNOSIS — R875 Abnormal microbiological findings in specimens from female genital organs: Secondary | ICD-10-CM | POA: Diagnosis not present

## 2015-08-21 DIAGNOSIS — R921 Mammographic calcification found on diagnostic imaging of breast: Secondary | ICD-10-CM | POA: Diagnosis not present

## 2015-08-21 DIAGNOSIS — Z1231 Encounter for screening mammogram for malignant neoplasm of breast: Secondary | ICD-10-CM | POA: Diagnosis not present

## 2015-08-27 DIAGNOSIS — H2513 Age-related nuclear cataract, bilateral: Secondary | ICD-10-CM | POA: Diagnosis not present

## 2015-08-29 DIAGNOSIS — Z1329 Encounter for screening for other suspected endocrine disorder: Secondary | ICD-10-CM | POA: Diagnosis not present

## 2015-08-29 DIAGNOSIS — Z13220 Encounter for screening for lipoid disorders: Secondary | ICD-10-CM | POA: Diagnosis not present

## 2015-08-29 DIAGNOSIS — Z136 Encounter for screening for cardiovascular disorders: Secondary | ICD-10-CM | POA: Diagnosis not present

## 2015-08-29 DIAGNOSIS — Z131 Encounter for screening for diabetes mellitus: Secondary | ICD-10-CM | POA: Diagnosis not present

## 2015-10-04 DIAGNOSIS — R635 Abnormal weight gain: Secondary | ICD-10-CM | POA: Diagnosis not present

## 2015-10-05 ENCOUNTER — Ambulatory Visit: Payer: BLUE CROSS/BLUE SHIELD | Attending: Specialist

## 2015-10-05 DIAGNOSIS — G4733 Obstructive sleep apnea (adult) (pediatric): Secondary | ICD-10-CM | POA: Diagnosis not present

## 2015-10-26 DIAGNOSIS — Z6841 Body Mass Index (BMI) 40.0 and over, adult: Secondary | ICD-10-CM | POA: Diagnosis not present

## 2015-10-26 DIAGNOSIS — N8501 Benign endometrial hyperplasia: Secondary | ICD-10-CM | POA: Diagnosis not present

## 2015-10-26 DIAGNOSIS — N950 Postmenopausal bleeding: Secondary | ICD-10-CM | POA: Diagnosis not present

## 2015-10-30 DIAGNOSIS — G4733 Obstructive sleep apnea (adult) (pediatric): Secondary | ICD-10-CM | POA: Diagnosis not present

## 2015-11-20 DIAGNOSIS — F4323 Adjustment disorder with mixed anxiety and depressed mood: Secondary | ICD-10-CM | POA: Diagnosis not present

## 2015-11-28 DIAGNOSIS — F54 Psychological and behavioral factors associated with disorders or diseases classified elsewhere: Secondary | ICD-10-CM | POA: Diagnosis not present

## 2015-11-28 DIAGNOSIS — Z6841 Body Mass Index (BMI) 40.0 and over, adult: Secondary | ICD-10-CM | POA: Diagnosis not present

## 2015-11-28 DIAGNOSIS — Z780 Asymptomatic menopausal state: Secondary | ICD-10-CM | POA: Diagnosis not present

## 2015-11-28 DIAGNOSIS — I10 Essential (primary) hypertension: Secondary | ICD-10-CM | POA: Diagnosis not present

## 2015-11-28 DIAGNOSIS — M419 Scoliosis, unspecified: Secondary | ICD-10-CM | POA: Diagnosis not present

## 2015-11-28 DIAGNOSIS — G4722 Circadian rhythm sleep disorder, advanced sleep phase type: Secondary | ICD-10-CM | POA: Diagnosis not present

## 2015-11-28 DIAGNOSIS — Z9989 Dependence on other enabling machines and devices: Secondary | ICD-10-CM | POA: Diagnosis not present

## 2015-11-28 DIAGNOSIS — M1990 Unspecified osteoarthritis, unspecified site: Secondary | ICD-10-CM | POA: Diagnosis not present

## 2015-11-28 DIAGNOSIS — R7303 Prediabetes: Secondary | ICD-10-CM | POA: Diagnosis not present

## 2015-11-29 DIAGNOSIS — Z4789 Encounter for other orthopedic aftercare: Secondary | ICD-10-CM | POA: Diagnosis not present

## 2015-11-29 DIAGNOSIS — M1711 Unilateral primary osteoarthritis, right knee: Secondary | ICD-10-CM | POA: Diagnosis not present

## 2015-11-29 DIAGNOSIS — T8482XD Fibrosis due to internal orthopedic prosthetic devices, implants and grafts, subsequent encounter: Secondary | ICD-10-CM | POA: Diagnosis not present

## 2015-11-29 DIAGNOSIS — G4733 Obstructive sleep apnea (adult) (pediatric): Secondary | ICD-10-CM | POA: Diagnosis not present

## 2015-12-12 DIAGNOSIS — Z6841 Body Mass Index (BMI) 40.0 and over, adult: Secondary | ICD-10-CM | POA: Diagnosis not present

## 2015-12-12 DIAGNOSIS — E538 Deficiency of other specified B group vitamins: Secondary | ICD-10-CM | POA: Diagnosis not present

## 2015-12-12 DIAGNOSIS — I10 Essential (primary) hypertension: Secondary | ICD-10-CM | POA: Diagnosis not present

## 2015-12-12 DIAGNOSIS — F329 Major depressive disorder, single episode, unspecified: Secondary | ICD-10-CM | POA: Diagnosis not present

## 2015-12-12 DIAGNOSIS — R7303 Prediabetes: Secondary | ICD-10-CM | POA: Diagnosis not present

## 2015-12-12 DIAGNOSIS — Z713 Dietary counseling and surveillance: Secondary | ICD-10-CM | POA: Diagnosis not present

## 2015-12-30 DIAGNOSIS — G4733 Obstructive sleep apnea (adult) (pediatric): Secondary | ICD-10-CM | POA: Diagnosis not present

## 2016-01-11 DIAGNOSIS — I10 Essential (primary) hypertension: Secondary | ICD-10-CM | POA: Diagnosis not present

## 2016-01-11 DIAGNOSIS — G4733 Obstructive sleep apnea (adult) (pediatric): Secondary | ICD-10-CM | POA: Diagnosis not present

## 2016-01-11 DIAGNOSIS — Z6841 Body Mass Index (BMI) 40.0 and over, adult: Secondary | ICD-10-CM | POA: Diagnosis not present

## 2016-01-11 DIAGNOSIS — Z713 Dietary counseling and surveillance: Secondary | ICD-10-CM | POA: Diagnosis not present

## 2016-01-11 DIAGNOSIS — E538 Deficiency of other specified B group vitamins: Secondary | ICD-10-CM | POA: Diagnosis not present

## 2016-01-11 DIAGNOSIS — F329 Major depressive disorder, single episode, unspecified: Secondary | ICD-10-CM | POA: Diagnosis not present

## 2016-01-25 DIAGNOSIS — Z4789 Encounter for other orthopedic aftercare: Secondary | ICD-10-CM | POA: Diagnosis not present

## 2016-01-25 DIAGNOSIS — T8482XD Fibrosis due to internal orthopedic prosthetic devices, implants and grafts, subsequent encounter: Secondary | ICD-10-CM | POA: Diagnosis not present

## 2016-01-25 DIAGNOSIS — Z471 Aftercare following joint replacement surgery: Secondary | ICD-10-CM | POA: Diagnosis not present

## 2016-01-25 DIAGNOSIS — Z96652 Presence of left artificial knee joint: Secondary | ICD-10-CM | POA: Diagnosis not present

## 2016-01-29 DIAGNOSIS — G4733 Obstructive sleep apnea (adult) (pediatric): Secondary | ICD-10-CM | POA: Diagnosis not present

## 2016-01-31 DIAGNOSIS — G4733 Obstructive sleep apnea (adult) (pediatric): Secondary | ICD-10-CM | POA: Diagnosis not present

## 2016-02-02 DIAGNOSIS — G4733 Obstructive sleep apnea (adult) (pediatric): Secondary | ICD-10-CM | POA: Diagnosis not present

## 2016-02-13 DIAGNOSIS — Z6841 Body Mass Index (BMI) 40.0 and over, adult: Secondary | ICD-10-CM | POA: Diagnosis not present

## 2016-02-13 DIAGNOSIS — R7303 Prediabetes: Secondary | ICD-10-CM | POA: Diagnosis not present

## 2016-02-13 DIAGNOSIS — E538 Deficiency of other specified B group vitamins: Secondary | ICD-10-CM | POA: Diagnosis not present

## 2016-02-13 DIAGNOSIS — Z713 Dietary counseling and surveillance: Secondary | ICD-10-CM | POA: Diagnosis not present

## 2016-02-25 DIAGNOSIS — S0501XA Injury of conjunctiva and corneal abrasion without foreign body, right eye, initial encounter: Secondary | ICD-10-CM | POA: Diagnosis not present

## 2016-02-27 DIAGNOSIS — S0501XD Injury of conjunctiva and corneal abrasion without foreign body, right eye, subsequent encounter: Secondary | ICD-10-CM | POA: Diagnosis not present

## 2016-03-02 DIAGNOSIS — G4733 Obstructive sleep apnea (adult) (pediatric): Secondary | ICD-10-CM | POA: Diagnosis not present

## 2016-03-05 ENCOUNTER — Ambulatory Visit (INDEPENDENT_AMBULATORY_CARE_PROVIDER_SITE_OTHER): Payer: BLUE CROSS/BLUE SHIELD | Admitting: Family Medicine

## 2016-03-05 ENCOUNTER — Encounter: Payer: Self-pay | Admitting: Family Medicine

## 2016-03-05 VITALS — BP 110/68 | HR 73 | Temp 98.9°F | Ht 65.0 in | Wt 276.0 lb

## 2016-03-05 DIAGNOSIS — I10 Essential (primary) hypertension: Secondary | ICD-10-CM | POA: Insufficient documentation

## 2016-03-05 DIAGNOSIS — Z23 Encounter for immunization: Secondary | ICD-10-CM | POA: Diagnosis not present

## 2016-03-05 DIAGNOSIS — E559 Vitamin D deficiency, unspecified: Secondary | ICD-10-CM | POA: Insufficient documentation

## 2016-03-05 DIAGNOSIS — F411 Generalized anxiety disorder: Secondary | ICD-10-CM | POA: Insufficient documentation

## 2016-03-05 DIAGNOSIS — Z01818 Encounter for other preprocedural examination: Secondary | ICD-10-CM | POA: Insufficient documentation

## 2016-03-05 DIAGNOSIS — Z01810 Encounter for preprocedural cardiovascular examination: Secondary | ICD-10-CM

## 2016-03-05 DIAGNOSIS — Z0000 Encounter for general adult medical examination without abnormal findings: Secondary | ICD-10-CM

## 2016-03-05 DIAGNOSIS — F419 Anxiety disorder, unspecified: Secondary | ICD-10-CM | POA: Insufficient documentation

## 2016-03-05 LAB — COMPREHENSIVE METABOLIC PANEL
ALT: 15 U/L (ref 0–35)
AST: 15 U/L (ref 0–37)
Albumin: 4.6 g/dL (ref 3.5–5.2)
Alkaline Phosphatase: 92 U/L (ref 39–117)
BUN: 27 mg/dL — AB (ref 6–23)
CO2: 30 meq/L (ref 19–32)
CREATININE: 0.82 mg/dL (ref 0.40–1.20)
Calcium: 10 mg/dL (ref 8.4–10.5)
Chloride: 103 mEq/L (ref 96–112)
GFR: 75.06 mL/min (ref >60.00)
GLUCOSE: 112 mg/dL — AB (ref 70–99)
Potassium: 4.1 mEq/L (ref 3.5–5.1)
SODIUM: 140 meq/L (ref 135–145)
Total Bilirubin: 0.4 mg/dL (ref 0.2–1.2)
Total Protein: 7.5 g/dL (ref 6.0–8.3)

## 2016-03-05 LAB — CBC WITH DIFFERENTIAL/PLATELET
BASOS PCT: 0.9 % (ref 0.0–3.0)
Basophils Absolute: 0.1 10*3/uL (ref 0.0–0.1)
Eosinophils Absolute: 0.2 10*3/uL (ref 0.0–0.7)
Eosinophils Relative: 2.2 % (ref 0.0–5.0)
HCT: 42.9 % (ref 36.0–46.0)
Hemoglobin: 14.3 g/dL (ref 12.0–15.0)
Lymphocytes Relative: 25.2 % (ref 12.0–46.0)
Lymphs Abs: 2.2 10*3/uL (ref 0.7–4.0)
MCHC: 33.4 g/dL (ref 30.0–36.0)
MCV: 85.7 fl (ref 78.0–100.0)
MONO ABS: 0.8 10*3/uL (ref 0.1–1.0)
Monocytes Relative: 9.6 % (ref 3.0–12.0)
NEUTROS ABS: 5.3 10*3/uL (ref 1.4–7.7)
Neutrophils Relative %: 62.1 % (ref 43.0–77.0)
PLATELETS: 257 10*3/uL (ref 150.0–400.0)
RBC: 5 Mil/uL (ref 3.87–5.11)
RDW: 14.1 % (ref 11.5–15.5)
WBC: 8.6 10*3/uL (ref 4.0–10.5)

## 2016-03-05 LAB — LIPID PANEL
CHOL/HDL RATIO: 4
Cholesterol: 175 mg/dL (ref 0–200)
HDL: 42.6 mg/dL (ref >39.00)
LDL CALC: 114 mg/dL — AB (ref 0–99)
NonHDL: 132.6
Triglycerides: 94 mg/dL (ref 0.0–149.0)
VLDL: 18.8 mg/dL (ref 0.0–40.0)

## 2016-03-05 LAB — TSH: TSH: 2.64 u[IU]/mL (ref 0.35–4.50)

## 2016-03-05 NOTE — Patient Instructions (Addendum)
If you are interested in a shingles/zoster vaccine - call your insurance to check on coverage,( you should not get it within 1 month of other vaccines) , then call us for a prescription  for it to take to a pharmacy that gives the shot , or make a nurse visit to get it here depending on your coverage   Flu shot today  No restrictions for upcoming surgery   Follow up for a bariatric visit

## 2016-03-05 NOTE — Assessment & Plan Note (Signed)
This is watched by gyn and tx with high dose weekly tx  Enc balanced diet and outdoor time emph imp to bone and overall health

## 2016-03-05 NOTE — Progress Notes (Signed)
Pre visit review using our clinic review tool, if applicable. No additional management support is needed unless otherwise documented below in the visit note. 

## 2016-03-05 NOTE — Assessment & Plan Note (Signed)
Discussed how this problem influences overall health and the risks it imposes  Reviewed plan for weight loss with lower calorie diet (via better food choices and also portion control or program like weight watchers) and exercise building up to or more than 30 minutes 5 days per week including some aerobic activity   Pt is going to clinic too plan for bariatric surgery Will f/u to disc in more detail since she will need a detailed letter

## 2016-03-05 NOTE — Assessment & Plan Note (Signed)
Cleared medically for upcoming knee surgery in march with Dr Maureen Ralphs  Noted nausea from anesthesia  No hx of allergic rxn  EKG re assuring and bp is controlled

## 2016-03-05 NOTE — Assessment & Plan Note (Signed)
bp in fair control at this time  BP Readings from Last 1 Encounters:  03/05/16 110/68   No changes needed Disc lifstyle change with low sodium diet and exercise  Treated by her gyn with [facility] today  Enc wt loss

## 2016-03-05 NOTE — Progress Notes (Signed)
Subjective:    Patient ID: Katrina Huffman, female    DOB: 1955-11-24, 61 y.o.   MRN: [id number]  HPI Here for health maintenance exam and to review chronic medical problems    Has had a lot going on   Also needs surgical clearance for ortho surg with Dr Maureen Ralphs for 3/18  To replace her plastic in her L knee replacement  Plans to have the other knee done in dec 2018   Wt Readings from Last 3 Encounters:  03/05/16 276 lb (125.2 kg)  05/09/15 274 lb (124.3 kg)  04/30/15 274 lb (124.3 kg)  she had considered bariatric surgery in the past  She made a big effort and lost 35 lb   (highest wt 286) Now she is seeing bariatric surgical service - has to go for 12 mo  Difficult to mt her motivation due to knee pain  bmi is 45.9  Has been diag with sleep apnea as well   Hep C/ HIV screening  Declines- low risk   Mammogram 5/06-normal  She had one last July - [facility]  Self breast exam   Pap/gyn care  Had gyn visit 7/17 and had nl pap  Had a D and C - for post menopausal bleeding for polyps Several endometrial bx -have all been normal  Has iud for several  Still has some spotting and her gyn is aware     Zoster vaccine - is interested in one  Flu vaccine -needs it /has not had it (missed it -usually gets it yearly)  Tetanus vaccine 4/09   Colonoscopy/ screening  3/17  One polyp -3 year recall   dexa 4/11 normal  No falls or fractures  She takes vit D regularly -once weekly for def - from gyn    Due for wellness labs today   Mood- has xanax and celexa on med list  For gen anxiety disorder   Clearance for surgery  EKG NSR rate of 70  No cardiac hx  No pulm history  Will have labs  She does have nausea with anesthesia  No allergies to latex or tape  No allergies to medication Asa bothers stomach occ    Patient Active Problem List   Diagnosis Date Noted  . Vitamin D deficiency 03/05/2016  . Anxiety disorder 03/05/2016  . Pre-operative clearance  03/05/2016  . Essential hypertension 03/05/2016  . 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 Special screening for malignant neoplasms, colon   . Benign neoplasm of transverse colon   . Screening for lipoid disorders 06/18/2010  . Routine general medical examination at a health care facility 06/18/2010  . Morbid obesity ([place]) 06/18/2010   Past Medical History:  Diagnosis Date  . Arthritis    hands, knees, back  . Depression   . Edema    left leg knee to foot, since veein stripping  . History of chicken pox   . Hypertension   . Scoliosis    sees chiropracter monthly  . Shortness of breath dyspnea    "out of shape"   Past Surgical History:  Procedure Laterality Date  . BREAST SURGERY  1999   breast biopsy  . BUNIONECTOMY  2000  . [place]   . COLONOSCOPY    . COLONOSCOPY WITH PROPOFOL N/A 04/30/2015   Procedure: COLONOSCOPY WITH PROPOFOL;  Surgeon: [name], MD;  Location: [facility];  Service: Endoscopy;  Laterality: N/A;  . DILATION AND CURETTAGE  OF UTERUS     x2 last one 1/17  . JOINT REPLACEMENT     LTKA-3 yrs ago  . KNEE ARTHROSCOPY Left 05/09/2015   Procedure: ARTHROSCOPY LEFT KNEE WITH SYNOVECTOMY;  Surgeon: [name], MD;  Location[facility];  Service: Orthopedics;  Laterality: Left;  . KNEE SURGERY Left 2009   x 2-meniscus, debridement  . LASIK    . POLYPECTOMY  04/30/2015   Procedure: POLYPECTOMY;  Surgeon: [name], MD;  Location: [facility];  Service: Endoscopy;;  . TONSILLECTOMY AND ADENOIDECTOMY  2007  . [place]  . VEIN LIGATION AND STRIPPING  2000   Social History  Substance Use Topics  . Smoking status: Never Smoker  . Smokeless tobacco: Never Used  . Alcohol use Yes     Comment: rare   Family History  Problem Relation Age of Onset  . Arthritis Mother   . Hypertension Mother   . Arthritis Father   . Cancer Father     prostate CA  . Hypertension  Father   . Diabetes Brother   . Cancer Paternal Aunt     breast cancer  . Arthritis Maternal Grandmother   . Hypertension Maternal Grandmother   . Arthritis Maternal Grandfather   . Hypertension Maternal Grandfather   . Diabetes Maternal Grandfather    No Known Allergies Current Outpatient Prescriptions on File Prior to Visit  Medication Sig Dispense Refill  . ALPRAZolam (XANAX) 0.5 MG tablet Take 0.5 mg by mouth daily as needed for anxiety.    . citalopram (CELEXA) 20 MG tablet Take 20 mg by mouth daily.      . hydrochlorothiazide (HYDRODIURIL) 25 MG tablet Take 25 mg by mouth daily.    . meloxicam (MOBIC) 15 MG tablet Take 15 mg by mouth daily.     No current facility-administered medications on file prior to visit.     Review of Systems Review of Systems  Constitutional: Negative for fever, appetite change,  and unexpected weight change.  Eyes: Negative for pain and visual disturbance.  Respiratory: Negative for cough and shortness of breath.   Cardiovascular: Negative for cp or palpitations    Gastrointestinal: Negative for nausea, diarrhea and constipation.  Genitourinary: Negative for urgency and frequency.  Skin: Negative for pallor or rash   MSK pos for bilat knee pain  Neurological: Negative for weakness, light-headedness, numbness and headaches.  Hematological: Negative for adenopathy. Does not bruise/bleed easily.  Psychiatric/Behavioral: Negative for dysphoric mood. Pos for anxiety that is well controlled         Objective:   Physical Exam  Constitutional: She appears well-developed and well-nourished. No distress.  Morbidly obese and well appearing   HENT:  Head: Normocephalic and atraumatic.  Right Ear: External ear normal.  Left Ear: External ear normal.  Nose: Nose normal.  Mouth/Throat: Oropharynx is clear and moist.  Eyes: Conjunctivae and EOM are normal. Pupils are equal, round, and reactive to light. Right eye exhibits no discharge. Left eye exhibits  no discharge. No scleral icterus.  Neck: Normal range of motion. Neck supple. No JVD present. Carotid bruit is not present. No thyromegaly present.  Cardiovascular: Normal rate, regular rhythm, normal heart sounds and intact distal pulses.  Exam reveals no gallop.   Pulmonary/Chest: Effort normal and breath sounds normal. No respiratory distress. She has no wheezes. She has no rales.  Abdominal: Soft. Bowel sounds are normal. She exhibits no distension and no mass. There is no tenderness.  Genitourinary:  Genitourinary Comments: Sees gyn for breast and pelvic exam  Musculoskeletal: She exhibits no edema or tenderness.  Lymphadenopathy:    She has no cervical adenopathy.  Neurological: She is alert. She has normal reflexes. No cranial nerve deficit. She exhibits normal muscle tone. Coordination normal.  Skin: Skin is warm and dry. No rash noted. No erythema. No pallor.  Some sks and lentigines  Psychiatric: She has a normal mood and affect.          Assessment & Plan:   Problem List Items Addressed This Visit      Cardiovascular and Mediastinum   Essential hypertension    bp in fair control at this time  BP Readings from Last 1 Encounters:  03/05/16 110/68   No changes needed Disc lifstyle change with low sodium diet and exercise  Treated by her gyn with hctz Lab today  Enc wt loss         Other   Anxiety disorder    tx by gyn Reviewed stressors/ coping techniques/symptoms/ support sources/ tx options and side effects in detail today On celexa Xanax sparingly  Enc exercise as tol and outdoor time  Offered counseling       Morbid obesity ([place])    Discussed how this problem influences overall health and the risks it imposes  Reviewed plan for weight loss with lower calorie diet (via better food choices and also portion control or program like weight watchers) and exercise building up to or more than 30 minutes 5 days per week including some aerobic activity   Pt is  going to clinic too plan for bariatric surgery Will f/u to disc in more detail since she will need a detailed letter       Pre-operative clearance    Cleared medically for upcoming knee surgery in march with Dr Maureen Ralphs  Noted nausea from anesthesia  No hx of allergic rxn  EKG re assuring and bp is controlled       Routine general medical examination at a health care facility    Reviewed health habits including diet and exercise and skin cancer prevention Reviewed appropriate screening tests for age  Also reviewed health mt list, fam hx and immunization status , as well as social and family history   See HPI  Flu shot today  She will check on coverage of zoster vaccine and does want one utd gyn care  Medically cleared for knee surgery  Wellness labs today Enc wt loss and continued f/u for planned bariatric surgery      Relevant Orders   CBC with Differential/Platelet   Comprehensive metabolic panel   Lipid panel   TSH   Vitamin D deficiency    This is watched by gyn and tx with high dose weekly tx  Enc balanced diet and outdoor time emph imp to bone and overall health       Other Visit Diagnoses    Pre-operative cardiovascular examination    -  Primary   Relevant Orders   EKG 12-Lead (Completed)   Need for influenza vaccination       Relevant Orders   Flu Vaccine QUAD 36+ mos IM (Completed)

## 2016-03-05 NOTE — Assessment & Plan Note (Signed)
Reviewed health habits including diet and exercise and skin cancer prevention Reviewed appropriate screening tests for age  Also reviewed health mt list, fam hx and immunization status , as well as social and family history   See HPI  Flu shot today  She will check on coverage of zoster vaccine and does want one utd gyn care  Medically cleared for knee surgery  Wellness labs today Enc wt loss and continued f/u for planned bariatric surgery

## 2016-03-05 NOTE — Assessment & Plan Note (Signed)
tx by gyn Reviewed stressors/ coping techniques/symptoms/ support sources/ tx options and side effects in detail today On celexa Xanax sparingly  Enc exercise as tol and outdoor time  [place] counseling

## 2016-03-07 ENCOUNTER — Encounter: Payer: Self-pay | Admitting: *Deleted

## 2016-03-12 DIAGNOSIS — F411 Generalized anxiety disorder: Secondary | ICD-10-CM | POA: Diagnosis not present

## 2016-03-19 ENCOUNTER — Encounter: Payer: Self-pay | Admitting: Family Medicine

## 2016-03-19 ENCOUNTER — Ambulatory Visit (INDEPENDENT_AMBULATORY_CARE_PROVIDER_SITE_OTHER): Payer: BLUE CROSS/BLUE SHIELD | Admitting: Family Medicine

## 2016-03-19 DIAGNOSIS — I10 Essential (primary) hypertension: Secondary | ICD-10-CM | POA: Diagnosis not present

## 2016-03-19 DIAGNOSIS — G8929 Other chronic pain: Secondary | ICD-10-CM | POA: Diagnosis not present

## 2016-03-19 DIAGNOSIS — M25561 Pain in right knee: Secondary | ICD-10-CM | POA: Diagnosis not present

## 2016-03-19 DIAGNOSIS — M25562 Pain in left knee: Secondary | ICD-10-CM

## 2016-03-19 MED ORDER — ZOSTER VACCINE LIVE 19400 UNT/0.65ML ~~LOC~~ SUSR: 0.6500 mL | Freq: Once | SUBCUTANEOUS | 0 refills | Status: AC

## 2016-03-19 NOTE — Progress Notes (Signed)
Pre visit review using our clinic review tool, if applicable. No additional management support is needed unless otherwise documented below in the visit note. 

## 2016-03-19 NOTE — Assessment & Plan Note (Addendum)
Pt is planning a bariatric surgery (gastric sleeve) in the future at [facility] (has to go to the clinic for a year) Seeing a counselor  Today reviewed her past wt loss efforts and reasons they failed  She is making progress with overeating issues and has lost by her count 11 lb  Has problems sticking to things and does not exercise-these things may hinder progress or success in the future if not resolved Enc continued work on this and also better food choices  Disc plan for exercise that does not hurt knees(bike or swimming or chair yoga)  Continued counseling  Co morbidities for obesity include OSA and hyperlipidemia and HTN Wt worsens her joint problems and scoliosis I feel she would do well with weight loss surgery once these issues have been addressed No medical restrictions for surgery  Will prep note for her clinic at [facility]  >25 minutes spent in face to face time with patient, >50% spent in counselling or coordination of care

## 2016-03-19 NOTE — Progress Notes (Signed)
Subjective:    Patient ID: Katrina Huffman, female    DOB: Nov 15, 1955, 61 y.o.   MRN: [id number]  HPI Here to discuss weight issues   Doing fine overall   Wt Readings from Last 3 Encounters:  03/19/16 276 lb 8 oz (125.4 kg)  03/05/16 276 lb (125.2 kg)  05/09/15 274 lb (124.3 kg)  has lost 11 lb since starting her wt loss effort since thanksgiving  bmi is 46.01  Hs hx of HTN BP Readings from Last 3 Encounters:  03/19/16 128/82  03/05/16 110/68  05/09/15 (!) 125/46   Glucose 112 non fasting  Lab Results  Component Value Date   CHOL 175 03/05/2016   HDL 42.60 03/05/2016   LDLCALC 114 (H) 03/05/2016   TRIG 94.0 03/05/2016   CHOLHDL 4 03/05/2016     She is planning bariatric surgery -most likely gastric sleeve  Going to a clinic in [place] - but will be having surgery at [facility] is [facility] bariatric  She thinks this will be key for weight loss    Has knee surgery planned for march   Eats yogurt and nuts and fruit For lunch eats 2 eggs daily  occ 1/2 [place] She is working on getting more lean protein  She struggles with portion control  Also snacking at night  Trying to back off of refined carbs and sugar   Had one session with psych counselor- will have 3 visits  Has not gotten into emotional eating / cues to eat etc Getting a healthy perspective re: overcoming stress/ good coping tech  Lack of exercise is a problem  Knee keeps her from exercising  Has a bike and not using it   She went to OA ([facility]) for 1 year - she liked it/ was helpful but the program fizzled out --approx 2013  It made her realize that she had no control over her intake and portions  Really likes food/ uses as a reward and also helps boredom  Learned some tools for overcoming that - keeping junk food out of the house / picking crunchy fruit and veg instead of sweets/etc Uses smaller plates  Forming better habits   Enjoyed that support  Now doing the support  group online for her bariatric clinic -also helpful   Does not have a surgical date yet  Per insurance she has to go to the clinic for 12 months (started in oct)  They will start submitting info to her ins after 6 mo  She would like to start loosing wt before her knee surgery   Also has sleep apnea since 9/17 - on cpap Also scoliosis   Formal programs for wt loss Weight watchers -in 1989 and then 2010 ([facility] [place])  Lost 25 lb   The diet center in [place] in [place] a gym and went on several occasions and had to stop due to knee pain   Bought a video program from [employer]- did not do it   Lost the most weight with diet pills (she was motivated during that time)- July 2013  Took phentermine- lost 35 lb  Then something else that gave her headaches    Tried a us/ heat contouring method at her gyn   Each time she tries something she goes back to old habits - because she is "lazy" Gets tired of the effort   Gets joy in reading/ bible study/ socializing / church activities  Patient Active Problem List   Diagnosis Date Noted  . Bilateral chronic knee pain 03/20/2016  . Vitamin D deficiency 03/05/2016  . Anxiety disorder 03/05/2016  . Pre-operative clearance 03/05/2016  . Essential hypertension 03/05/2016  . 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 Special screening for malignant neoplasms, colon   . Benign neoplasm of transverse colon   . Screening for lipoid disorders 06/18/2010  . Routine general medical examination at a health care facility 06/18/2010  . Morbid obesity ([place]) 06/18/2010   Past Medical History:  Diagnosis Date  . Arthritis    hands, knees, back  . Depression   . Edema    left leg knee to foot, since veein stripping  . History of chicken pox   . Hypertension   . Scoliosis    sees chiropracter monthly  . Shortness of breath dyspnea    "out of shape"   Past Surgical History:  Procedure  Laterality Date  . BREAST SURGERY  1999   breast biopsy  . BUNIONECTOMY  2000  . [place]   . COLONOSCOPY    . COLONOSCOPY WITH PROPOFOL N/A 04/30/2015   Procedure: COLONOSCOPY WITH PROPOFOL;  Surgeon: [name], MD;  Location: [facility];  Service: Endoscopy;  Laterality: N/A;  . DILATION AND CURETTAGE OF UTERUS     x2 last one 1/17  . JOINT REPLACEMENT     LTKA-3 yrs ago  . KNEE ARTHROSCOPY Left 05/09/2015   Procedure: ARTHROSCOPY LEFT KNEE WITH SYNOVECTOMY;  Surgeon: [name], MD;  [facility];  Service: Orthopedics;  Laterality: Left;  . KNEE SURGERY Left 2009   x 2-meniscus, debridement  . LASIK    . POLYPECTOMY  04/30/2015   Procedure: POLYPECTOMY;  Surgeon: [name], MD;  Location: [facility];  Service: Endoscopy;;  . TONSILLECTOMY AND ADENOIDECTOMY  2007  . [place]  . VEIN LIGATION AND STRIPPING  2000   Social History  Substance Use Topics  . Smoking status: Never Smoker  . Smokeless tobacco: Never Used  . Alcohol use Yes     Comment: rare   Family History  Problem Relation Age of Onset  . Arthritis Mother   . Hypertension Mother   . Arthritis Father   . Cancer Father     prostate CA  . Hypertension Father   . Diabetes Brother   . Cancer Paternal Aunt     breast cancer  . Arthritis Maternal Grandmother   . Hypertension Maternal Grandmother   . Arthritis Maternal Grandfather   . Hypertension Maternal Grandfather   . Diabetes Maternal Grandfather    No Known Allergies Current Outpatient Prescriptions on File Prior to Visit  Medication Sig Dispense Refill  . ALPRAZolam (XANAX) 0.5 MG tablet Take 0.5 mg by mouth daily as needed for anxiety.    . citalopram (CELEXA) 20 MG tablet Take 20 mg by mouth daily.      . cyanocobalamin 1000 MCG tablet Take 1,000 mcg by mouth daily.    . hydrochlorothiazide (HYDRODIURIL) 25 MG tablet Take 25 mg by mouth daily.    . meloxicam (MOBIC) 15 MG tablet Take 15  mg by mouth daily.    . Vitamin D, Ergocalciferol, (DRISDOL) 50000 units CAPS capsule Take 1 capsule by mouth once a week.  3   No current facility-administered medications on file prior to visit.     Review of Systems Review of Systems  Constitutional: Negative  for fever, appetite change, fatigue and unexpected weight change. pos for morbid obesity that limits mobility Eyes: Negative for pain and visual disturbance.  Respiratory: Negative for cough and shortness of breath.   Cardiovascular: Negative for cp or palpitations    Gastrointestinal: Negative for nausea, diarrhea and constipation.  Genitourinary: Negative for urgency and frequency.  MSK pos for bilat chronic knee pain with difficulty walking  Skin: Negative for pallor or rash   Neurological: Negative for weakness, light-headedness, numbness and headaches.  Hematological: Negative for adenopathy. Does not bruise/bleed easily.  Psychiatric/Behavioral: Negative for dysphoric mood. The patient is not nervous/anxious. Pos for compulsive overeating disorder         Objective:   Physical Exam  Constitutional: She appears well-developed and well-nourished. No distress.  Morbidly obese and well appearing  HENT:  Head: Atraumatic.  Cardiovascular: Normal rate and regular rhythm.   Skin: Skin is warm and dry. No rash noted. No pallor.  Psychiatric: She has a normal mood and affect.  Talks candidly about her wt problem and past efforts to control it  Tearful at times           Assessment & Plan:   Problem List Items Addressed This Visit      Other   Morbid obesity ([place]) - Primary    Pt is planning a bariatric surgery (gastric sleeve) in the future at [facility] (has to go to the clinic for a year) Seeing a counselor  Today reviewed her past wt loss efforts and reasons they failed  She is making progress with overeating issues and has lost by her count 11 lb  Has problems sticking to things and does not exercise-these things may  hinder progress or success in the future if not resolved Enc continued work on this and also better food choices  Disc plan for exercise that does not hurt knees(bike or swimming or chair yoga)  Continued counseling  Co morbidities for obesity include OSA and hyperlipidemia and HTN Wt worsens her joint problems and scoliosis I feel she would do well with weight loss surgery once these issues have been addressed No medical restrictions for surgery  Will prep note for her clinic at [facility]

## 2016-03-19 NOTE — Patient Instructions (Addendum)
Come up with 3 reasons you do not exercise and discuss this with your counselor  If you loose interest in lifestyle change and effort then you will not succeed in weight loss  Also 3 reasons you overeat and discuss that also   Send me the address to send the letter (attn: Baxter Flattery )  Keep working on development of good habits

## 2016-03-20 DIAGNOSIS — G8929 Other chronic pain: Secondary | ICD-10-CM | POA: Insufficient documentation

## 2016-03-20 DIAGNOSIS — M25562 Pain in left knee: Secondary | ICD-10-CM

## 2016-03-20 DIAGNOSIS — M25561 Pain in right knee: Secondary | ICD-10-CM

## 2016-03-20 NOTE — Assessment & Plan Note (Signed)
Morbid obesity worsens her knee pain and OA Would benefit from wt loss  Has knee surgery planned upcoming and is trying to loose wt for this  Enc low impact exercise like recumbent bike/ water or chair exercise program

## 2016-03-20 NOTE — Assessment & Plan Note (Signed)
bp is stable today  No cp or palpitations or headaches or edema  No side effects to medicines  BP Readings from Last 3 Encounters:  03/19/16 128/82  03/05/16 110/68  05/09/15 (!) 125/46    Morbid obesity adds to this problem - wt loss and an exercise program is recommended

## 2016-03-21 DIAGNOSIS — E119 Type 2 diabetes mellitus without complications: Secondary | ICD-10-CM | POA: Diagnosis not present

## 2016-03-21 DIAGNOSIS — R7303 Prediabetes: Secondary | ICD-10-CM | POA: Diagnosis not present

## 2016-03-21 DIAGNOSIS — G4733 Obstructive sleep apnea (adult) (pediatric): Secondary | ICD-10-CM | POA: Diagnosis not present

## 2016-03-21 DIAGNOSIS — Z6841 Body Mass Index (BMI) 40.0 and over, adult: Secondary | ICD-10-CM | POA: Diagnosis not present

## 2016-03-21 DIAGNOSIS — E538 Deficiency of other specified B group vitamins: Secondary | ICD-10-CM | POA: Diagnosis not present

## 2016-03-24 ENCOUNTER — Telehealth: Payer: Self-pay | Admitting: Family Medicine

## 2016-03-24 NOTE — Telephone Encounter (Signed)
Letter for support of weight loss program-please send to her wt loss clinic at [facility]  In [place] box

## 2016-03-25 ENCOUNTER — Encounter (HOSPITAL_COMMUNITY): Payer: Self-pay | Admitting: *Deleted

## 2016-03-25 NOTE — Telephone Encounter (Signed)
Dr. Glori Bickers sent me the name of the provider (Tara L. Zychowicz, FNP), I faxed the letter to her

## 2016-03-27 ENCOUNTER — Ambulatory Visit: Payer: Self-pay | Admitting: Orthopedic Surgery

## 2016-04-02 DIAGNOSIS — G4733 Obstructive sleep apnea (adult) (pediatric): Secondary | ICD-10-CM | POA: Diagnosis not present

## 2016-04-03 DIAGNOSIS — D225 Melanocytic nevi of trunk: Secondary | ICD-10-CM | POA: Diagnosis not present

## 2016-04-09 ENCOUNTER — Other Ambulatory Visit (HOSPITAL_COMMUNITY): Payer: Self-pay | Admitting: Emergency Medicine

## 2016-04-09 NOTE — Patient Instructions (Addendum)
Katrina Huffman  04/09/2016   Your procedure is scheduled on: 04-16-16  Report to [facility]  Entrance take [facility]  elevators to 3rd floor to  [facility] [place].  Call this number if you have problems the morning of surgery [phone]   Remember: ONLY 1 PERSON MAY GO WITH YOU TO SHORT STAY TO GET  READY MORNING OF [place].  Do not eat food:After Midnight Tuesday night. You may have clear liquids from midnight until 8am day of surgery. Nothing by mouth after 8am!!     Take these medicines the morning of surgery with A SIP OF WATER: xanax as needed, citalopram(celexa)                                You may not have any metal on your body including hair pins and              piercings  Do not wear jewelry, make-up, lotions, powders or perfumes, deodorant             Do not wear nail polish.  Do not shave  48 hours prior to surgery.              Men may shave face and neck.   Do not bring valuables to the hospital. [facility].  Contacts, dentures or bridgework may not be worn into surgery.  Leave suitcase in the car. After surgery it may be brought to your room.   Bring mask and tubing. Device will be supplied               Please read over the following fact sheets you were given: _____________________________________________________________________     CLEAR LIQUID DIET   Foods Allowed                                                                     Foods Excluded  Coffee and tea, regular and decaf                             liquids that you cannot  Plain Jell-O in any flavor                                             see through such as: Fruit ices (not with fruit pulp)                                     milk, soups, orange juice  Iced Popsicles                                    All solid food Carbonated beverages, regular and  diet                                    Cranberry,  grape and apple juices Sports drinks like Gatorade Lightly seasoned clear broth or consume(fat free) Sugar, honey syrup  Sample Menu Breakfast                                Lunch                                     Supper Cranberry juice                    Beef broth                            Chicken broth Jell-O                                     Grape juice                           Apple juice Coffee or tea                        Jell-O                                      Popsicle                                                Coffee or tea                        Coffee or tea  _____________________________________________________________________  [facility] - Preparing for Surgery Before surgery, you can play an important role.  Because skin is not sterile, your skin needs to be as free of germs as possible.  You can reduce the number of germs on your skin by washing with CHG (chlorahexidine gluconate) soap before surgery.  CHG is an antiseptic cleaner which kills germs and bonds with the skin to continue killing germs even after washing. Please DO NOT use if you have an allergy to CHG or antibacterial soaps.  If your skin becomes reddened/irritated stop using the CHG and inform your nurse when you arrive at Short Stay. Do not shave (including legs and underarms) for at least 48 hours prior to the first CHG shower.  You may shave your face/neck. Please follow these instructions carefully:  1.  Shower with CHG Soap the night before surgery and the  morning of Surgery.  2.  If you choose to wash your hair, wash your hair first as usual with your  normal  shampoo.  3.  After you shampoo, rinse your hair and body thoroughly to remove the  shampoo.  4.  Use CHG as you would any other liquid soap.  You can apply chg directly  to the skin and wash                       Gently with a scrungie or clean washcloth.  5.  Apply the CHG Soap to your body ONLY FROM THE NECK  DOWN.   Do not use on face/ open                           Wound or open sores. Avoid contact with eyes, ears mouth and genitals (private parts).                       Wash face,  Genitals (private parts) with your normal soap.             6.  Wash thoroughly, paying special attention to the area where your surgery  will be performed.  7.  Thoroughly rinse your body with warm water from the neck down.  8.  DO NOT shower/wash with your normal soap after using and rinsing off  the CHG Soap.                9.  Pat yourself dry with a clean towel.            10.  Wear clean pajamas.            11.  Place clean sheets on your bed the night of your first shower and do not  sleep with pets. Day of Surgery : Do not apply any lotions/deodorants the morning of surgery.  Please wear clean clothes to the hospital/surgery center.  FAILURE TO FOLLOW THESE INSTRUCTIONS MAY RESULT IN THE CANCELLATION OF YOUR SURGERY PATIENT SIGNATURE_________________________________  NURSE SIGNATURE__________________________________    ________________________________________________________________________ WHAT IS A BLOOD TRANSFUSION? Blood Transfusion Information  A transfusion is the replacement of blood or some of its parts. Blood is made up of multiple cells which provide different functions.  Red blood cells carry oxygen and are used for blood loss replacement.  White blood cells fight against infection.  Platelets control bleeding.  Plasma helps clot blood.  Other blood products are available for specialized needs, such as hemophilia or other clotting disorders. BEFORE THE TRANSFUSION  Who gives blood for transfusions?   Healthy volunteers who are fully evaluated to make sure their blood is safe. This is blood bank blood. Transfusion therapy is the safest it has ever been in the practice of medicine. Before blood is taken from a donor, a complete history is taken to make sure that person has no history of  diseases nor engages in risky social behavior (examples are intravenous drug use or sexual activity with multiple partners). The donor's travel history is screened to minimize risk of transmitting infections, such as malaria. The donated blood is tested for signs of infectious diseases, such as HIV and hepatitis. The blood is then tested to be sure it is compatible with you in order to minimize the chance of a transfusion reaction. If you or a relative donates blood, this is often done in anticipation of surgery and is not appropriate for emergency situations. It takes many days to process the donated blood. RISKS AND COMPLICATIONS Although transfusion therapy is very safe and saves many lives, the main dangers of transfusion include:   Getting an infectious disease.  Developing a transfusion  reaction. This is an allergic reaction to something in the blood you were given. Every precaution is taken to prevent this. The decision to have a blood transfusion has been considered carefully by your caregiver before blood is given. Blood is not given unless the benefits outweigh the risks. AFTER THE TRANSFUSION  Right after receiving a blood transfusion, you will usually feel much better and more energetic. This is especially true if your red blood cells have gotten low (anemic). The transfusion raises the level of the red blood cells which carry oxygen, and this usually causes an energy increase.  The nurse administering the transfusion will monitor you carefully for complications. HOME CARE INSTRUCTIONS  No special instructions are needed after a transfusion. You may find your energy is better. Speak with your caregiver about any limitations on activity for underlying diseases you may have. SEEK MEDICAL CARE IF:   Your condition is not improving after your transfusion.  You develop redness or irritation at the intravenous (IV) site. SEEK IMMEDIATE MEDICAL CARE IF:  Any of the following symptoms occur  over the next 12 hours:  Shaking chills.  You have a temperature by mouth above 102 F (38.9 C), not controlled by medicine.  Chest, back, or muscle pain.  People around you feel you are not acting correctly or are confused.  Shortness of breath or difficulty breathing.  Dizziness and fainting.  You get a rash or develop hives.  You have a decrease in urine output.  Your urine turns a dark color or changes to pink, red, or brown. Any of the following symptoms occur over the next 10 days:  You have a temperature by mouth above 102 F (38.9 C), not controlled by medicine.  Shortness of breath.  Weakness after normal activity.  The white part of the eye turns yellow (jaundice).  You have a decrease in the amount of urine or are urinating less often.  Your urine turns a dark color or changes to pink, red, or brown. Document Released: 01/18/2000 Document Revised: 04/14/2011 Document Reviewed: 09/06/2007 [facility] Patient Information 2014 [place], [place].  _______________________________________________________________________

## 2016-04-09 NOTE — Progress Notes (Signed)
Medical clearance 03-05-16 on chart ekg 03-05-16 epic

## 2016-04-10 ENCOUNTER — Encounter (INDEPENDENT_AMBULATORY_CARE_PROVIDER_SITE_OTHER): Payer: Self-pay

## 2016-04-10 ENCOUNTER — Encounter (HOSPITAL_COMMUNITY)
Admission: RE | Admit: 2016-04-10 | Discharge: 2016-04-10 | Disposition: A | Discharge status: Home / Self Care | Payer: BLUE CROSS/BLUE SHIELD | Source: Ambulatory Visit | Attending: Orthopedic Surgery | Admitting: Orthopedic Surgery

## 2016-04-10 ENCOUNTER — Encounter (HOSPITAL_COMMUNITY): Payer: Self-pay

## 2016-04-10 DIAGNOSIS — Z01812 Encounter for preprocedural laboratory examination: Secondary | ICD-10-CM | POA: Diagnosis not present

## 2016-04-10 HISTORY — DX: Other specified postprocedural states: Z98.890

## 2016-04-10 HISTORY — DX: Sleep apnea, unspecified: G47.30

## 2016-04-10 HISTORY — DX: Other specified postprocedural states: R11.2

## 2016-04-10 LAB — COMPREHENSIVE METABOLIC PANEL
ALK PHOS: 83 U/L (ref 38–126)
ALT: 17 U/L (ref 14–54)
AST: 17 U/L (ref 15–41)
Albumin: 3.9 g/dL (ref 3.5–5.0)
Anion gap: 5 (ref 5–15)
BILIRUBIN TOTAL: 0.7 mg/dL (ref 0.3–1.2)
BUN: 24 mg/dL — ABNORMAL HIGH (ref 6–20)
CALCIUM: 9.2 mg/dL (ref 8.9–10.3)
CO2: 28 mmol/L (ref 22–32)
Chloride: 106 mmol/L (ref 101–111)
Creatinine, Ser: 0.76 mg/dL (ref 0.44–1.00)
GFR calc non Af Amer: >60 mL/min (ref >60)
Glucose, Bld: 104 mg/dL — ABNORMAL HIGH (ref 65–99)
POTASSIUM: 4.7 mmol/L (ref 3.5–5.1)
Sodium: 139 mmol/L (ref 135–145)
TOTAL PROTEIN: 6.8 g/dL (ref 6.5–8.1)

## 2016-04-10 LAB — CBC
HEMATOCRIT: 39.6 % (ref 36.0–46.0)
HEMOGLOBIN: 12.9 g/dL (ref 12.0–15.0)
MCH: 28.1 pg (ref 26.0–34.0)
MCHC: 32.6 g/dL (ref 30.0–36.0)
MCV: 86.3 fL (ref 78.0–100.0)
Platelets: 234 10*3/uL (ref 150–400)
RBC: 4.59 MIL/uL (ref 3.87–5.11)
RDW: 13.9 % (ref 11.5–15.5)
WBC: 7.6 10*3/uL (ref 4.0–10.5)

## 2016-04-10 LAB — ABO/RH: ABO/RH(D): O POS

## 2016-04-10 LAB — PROTIME-INR
INR: 1.12
Prothrombin Time: 14.5 s (ref 11.4–15.2)

## 2016-04-10 LAB — SURGICAL PCR SCREEN
MRSA, PCR: NEGATIVE
STAPHYLOCOCCUS AUREUS: NEGATIVE

## 2016-04-10 LAB — APTT: aPTT: 31 s (ref 24–36)

## 2016-04-10 NOTE — Progress Notes (Signed)
Spoke with [facility] front desk and consent and OR Schedule are an ok match. No changes to be made

## 2016-04-15 ENCOUNTER — Ambulatory Visit: Payer: Self-pay | Admitting: Orthopedic Surgery

## 2016-04-15 MED ORDER — DEXTROSE 5 % IV SOLN
3.0000 g | INTRAVENOUS | Status: AC
  Administered 2016-04-16: 3 g via INTRAVENOUS
  Filled 2016-04-15: qty 3

## 2016-04-15 NOTE — H&P (Signed)
Katrina Huffman DOB: 1955-05-16 Divorced / Language: English / Race: White Female Date of Admission:  04/16/2016 CC:  Left knee clunk History of Present Illness The patient is a 61 year old female who comes in for a preoperative History and Physical. The patient is scheduled for a left total knee polyethtylene revision to be performed by Dr. Dione Plover. Aluisio, MD at [facility] on 04/16/2016. Patient is S/P knee arthroscopy with synovectomy. Overall the patient feels that the left knee is worse since the beginning of September when she states the knee started "crunching" again. The patient does report that the pain is becoming more constant.  She has had the total knee in for abour 4 years now and developed a patellar clunk issue. She had the knee arthroscpy which helped for about five months. Unfortunately, the crunching has returned and is now much more pronounced and also quite painful at this time which she did not have prior to the previous knee scope and debridment. Her left knee has again developed a painful popping. She has developed the painful popping again. It is very unusual for this to recur and only time I have ever seen to recur was secondary to instability. I think the only way, we can potentially alleviate or eliminate this is to place a thicker polyethylene. She does have some laxity in flexion and extension thus I think we can fix with just thicker poly and not do full blown revision. She is subsequently admitted for revision procedure. Risks and benefits of the surgery have been discussed with the patient and they elect to proceed with surgery.  There are on active contraindications to upcoming procedure such as ongoing infection or progressive neurological disease.   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)  Obesity  Sleep Apnea  uses  CPAP Hypertension  Menopause  Allergies  No Known Drug Allergies  Family History Cancer  father and brother Hypertension  mother, father, grandmother mothers side and grandfather mothers side  Social History Alcohol use  current drinker; only occasionally per week Children  2 Current work status  working full time Drug/Alcohol Rehab (Currently)  no Drug/Alcohol Rehab (Previously)  no Exercise  Exercises rarely Illicit drug use  no Living situation  live alone Marital status  divorced Number of flights of stairs before winded  1 Pain Contract  no Tobacco use  never smoker  Medication History Citalopram Hydrobromide (20MG  Tablet, Oral) Active. ALPRAZolam (0.5MG  Tablet, Oral) Active. (prn) Vitamin D (Oral) Specific strength unknown - Active. Meloxicam (15MG  Tablet, Oral) Active. HydroCHLOROthiazide (25MG  Tablet, Oral) Active.  Past Surgical History Cesarean Delivery  2 times Dilation and Curettage of Uterus - Multiple  Foot Surgery  left Leg Circulation Surgery  left Tonsillectomy  Total Knee Replacement  left Tubal Ligation     Review of Systems General Not Present- Chills, Fatigue, Fever, Memory Loss, Night Sweats, Weight Gain and Weight Loss. Skin Not Present- Eczema, Hives, Itching, Lesions and Rash. HEENT Not Present- Dentures, Double Vision, Headache, Hearing Loss, Tinnitus and Visual Loss. Respiratory Not Present- Allergies, Chronic Cough, Coughing up blood, Shortness of breath at rest and Shortness of breath with exertion. Cardiovascular Not Present- Chest Pain, Difficulty Breathing Lying Down, Murmur, Palpitations, Racing/skipping heartbeats and Swelling. Gastrointestinal Not Present- Abdominal Pain, Bloody Stool, Constipation, Diarrhea, Difficulty Swallowing, Heartburn, Jaundice, Loss of appetitie, Nausea and Vomiting. Female Genitourinary Not Present- Blood in Urine, Discharge, Flank Pain,  Incontinence, Painful Urination, Urgency,  Urinary frequency, Urinary Retention, Urinating at Night and Weak urinary stream. Musculoskeletal Present- Back Pain (has scoliosis) and Joint Pain. Not Present- Joint Swelling, Morning Stiffness, Muscle Pain, Muscle Weakness and Spasms. Neurological Not Present- Blackout spells, Difficulty with balance, Dizziness, Paralysis, Tremor and Weakness. Psychiatric Not Present- Insomnia.  Vitals Weight: 278 lb Height: 65in Body Surface Area: 2.28 m Body Mass Index: 46.26 kg/m  Pulse: 72 (Regular)  BP: 118/72 (Sitting, Right Arm, Standard)   Physical Exam General Mental Status -Alert, cooperative and good historian. General Appearance-pleasant, Not in acute distress. Orientation-Oriented X3. Build & Nutrition-Well nourished and Well developed.  Head and Neck Head-normocephalic, atraumatic . Neck Global Assessment - supple, no bruit auscultated on the right, no bruit auscultated on the left.  Eye Vision-Wears corrective lenses. Pupil - Bilateral-Regular and Round. Motion - Bilateral-EOMI.  Chest and Lung Exam Auscultation Breath sounds - clear at anterior chest wall and clear at posterior chest wall. Adventitious sounds - No Adventitious sounds.  Cardiovascular Auscultation Rhythm - Regular rate and rhythm. Heart Sounds - S1 WNL and S2 WNL. Murmurs & Other Heart Sounds - Auscultation of the heart reveals - No Murmurs.  Abdomen Inspection Contour - Generalized moderate distention. Palpation/Percussion Tenderness - Abdomen is non-tender to palpation. Rigidity (guarding) - Abdomen is soft. Auscultation Auscultation of the abdomen reveals - Bowel sounds normal.  Female Genitourinary Note: Not done, not pertinent to present illness   Musculoskeletal Note: She is in no distress. Left knee, no effusion. Her range is about 0 to 125. She has significant crepitus when going from a flexed to an extended position. She has a moderate amount of varus and valgus  and AP laxity.   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 Glori Bickers - Patient has been seen preoperatively and felt to be stable for surgery.  IV TXA  Anesthesia Issues: None except occasional nausea  Patient was instructed on what medications to stop prior to surgery.

## 2016-04-16 ENCOUNTER — Encounter (HOSPITAL_COMMUNITY)
Admission: RE | Disposition: A | Discharge status: Home / Self Care | Payer: Self-pay | Source: Ambulatory Visit | Attending: Orthopedic Surgery

## 2016-04-16 ENCOUNTER — Observation Stay (HOSPITAL_COMMUNITY)
Admission: RE | Admit: 2016-04-16 | Discharge: 2016-04-17 | Disposition: A | Discharge status: Home / Self Care | Payer: BLUE CROSS/BLUE SHIELD | Source: Ambulatory Visit | Attending: Orthopedic Surgery | Admitting: Orthopedic Surgery

## 2016-04-16 ENCOUNTER — Ambulatory Visit (HOSPITAL_COMMUNITY): Payer: BLUE CROSS/BLUE SHIELD | Admitting: Anesthesiology

## 2016-04-16 ENCOUNTER — Encounter (HOSPITAL_COMMUNITY): Payer: Self-pay | Admitting: *Deleted

## 2016-04-16 DIAGNOSIS — Z6841 Body Mass Index (BMI) 40.0 and over, adult: Secondary | ICD-10-CM | POA: Diagnosis not present

## 2016-04-16 DIAGNOSIS — Y792 Prosthetic and other implants, materials and accessory orthopedic devices associated with adverse incidents: Secondary | ICD-10-CM | POA: Diagnosis not present

## 2016-04-16 DIAGNOSIS — G4730 Sleep apnea, unspecified: Secondary | ICD-10-CM | POA: Insufficient documentation

## 2016-04-16 DIAGNOSIS — Z7982 Long term (current) use of aspirin: Secondary | ICD-10-CM | POA: Diagnosis not present

## 2016-04-16 DIAGNOSIS — T8484XA Pain due to internal orthopedic prosthetic devices, implants and grafts, initial encounter: Secondary | ICD-10-CM | POA: Diagnosis not present

## 2016-04-16 DIAGNOSIS — R262 Difficulty in walking, not elsewhere classified: Secondary | ICD-10-CM | POA: Diagnosis not present

## 2016-04-16 DIAGNOSIS — M419 Scoliosis, unspecified: Secondary | ICD-10-CM | POA: Insufficient documentation

## 2016-04-16 DIAGNOSIS — Z96659 Presence of unspecified artificial knee joint: Secondary | ICD-10-CM

## 2016-04-16 DIAGNOSIS — I10 Essential (primary) hypertension: Secondary | ICD-10-CM | POA: Diagnosis not present

## 2016-04-16 DIAGNOSIS — T84093A Other mechanical complication of internal left knee prosthesis, initial encounter: Secondary | ICD-10-CM | POA: Diagnosis not present

## 2016-04-16 DIAGNOSIS — T84018A Broken internal joint prosthesis, other site, initial encounter: Secondary | ICD-10-CM

## 2016-04-16 DIAGNOSIS — M1711 Unilateral primary osteoarthritis, right knee: Secondary | ICD-10-CM | POA: Insufficient documentation

## 2016-04-16 DIAGNOSIS — F329 Major depressive disorder, single episode, unspecified: Secondary | ICD-10-CM | POA: Insufficient documentation

## 2016-04-16 DIAGNOSIS — E559 Vitamin D deficiency, unspecified: Secondary | ICD-10-CM | POA: Diagnosis not present

## 2016-04-16 DIAGNOSIS — T84018S Broken internal joint prosthesis, other site, sequela: Secondary | ICD-10-CM

## 2016-04-16 DIAGNOSIS — T84023A Instability of internal left knee prosthesis, initial encounter: Principal | ICD-10-CM | POA: Insufficient documentation

## 2016-04-16 DIAGNOSIS — G8918 Other acute postprocedural pain: Secondary | ICD-10-CM | POA: Diagnosis not present

## 2016-04-16 HISTORY — PX: TOTAL KNEE ARTHROPLASTY WITH REVISION COMPONENTS: SHX6198

## 2016-04-16 LAB — TYPE AND SCREEN
ABO/RH(D): O POS
ANTIBODY SCREEN: NEGATIVE

## 2016-04-16 SURGERY — TOTAL KNEE ARTHROPLASTY WITH REVISION COMPONENTS
Anesthesia: Spinal | Site: Knee | Laterality: Left

## 2016-04-16 MED ORDER — MIDAZOLAM HCL 5 MG/5ML IJ SOLN
1.0000 mg | INTRAMUSCULAR | Status: DC | PRN
  Administered 2016-04-16: 2 mg via INTRAVENOUS
  Administered 2016-04-16: 1 mg via INTRAVENOUS

## 2016-04-16 MED ORDER — HYDROMORPHONE HCL 1 MG/ML IJ SOLN
0.2500 mg | INTRAMUSCULAR | Status: DC | PRN
  Administered 2016-04-16 (×4): 0.5 mg via INTRAVENOUS

## 2016-04-16 MED ORDER — SODIUM CHLORIDE 0.9 % IV SOLN
INTRAVENOUS | Status: DC
  Administered 2016-04-16: 75 mL/h via INTRAVENOUS

## 2016-04-16 MED ORDER — PROMETHAZINE HCL 25 MG/ML IJ SOLN
6.2500 mg | INTRAMUSCULAR | Status: DC | PRN
Start: 2016-04-16 — End: 2016-04-16

## 2016-04-16 MED ORDER — ONDANSETRON HCL 4 MG/2ML IJ SOLN: 4.0000 mg | Freq: Four times a day (QID) | INTRAMUSCULAR | Status: DC | PRN

## 2016-04-16 MED ORDER — PROPOFOL 500 MG/50ML IV EMUL
INTRAVENOUS | Status: DC | PRN
  Administered 2016-04-16 (×2): 20 mg via INTRAVENOUS
  Administered 2016-04-16: 250 mg via INTRAVENOUS
  Administered 2016-04-16: 20 mg via INTRAVENOUS

## 2016-04-16 MED ORDER — ASPIRIN EC 325 MG PO TBEC
325.0000 mg | DELAYED_RELEASE_TABLET | Freq: Two times a day (BID) | ORAL | Status: DC
  Administered 2016-04-17: 325 mg via ORAL
  Filled 2016-04-16: qty 1

## 2016-04-16 MED ORDER — PHENOL 1.4 % MT LIQD: 1.0000 | OROMUCOSAL | Status: DC | PRN

## 2016-04-16 MED ORDER — LIDOCAINE HCL (CARDIAC) 20 MG/ML IV SOLN
INTRAVENOUS | Status: DC | PRN
  Administered 2016-04-16: 80 mg via INTRATRACHEAL

## 2016-04-16 MED ORDER — FENTANYL CITRATE (PF) 100 MCG/2ML IJ SOLN
INTRAMUSCULAR | Status: AC
  Filled 2016-04-16: qty 2

## 2016-04-16 MED ORDER — METOCLOPRAMIDE HCL 5 MG PO TABS
5.0000 mg | ORAL_TABLET | Freq: Three times a day (TID) | ORAL | Status: DC | PRN
Start: 2016-04-16 — End: 2016-04-17

## 2016-04-16 MED ORDER — DEXAMETHASONE SODIUM PHOSPHATE 10 MG/ML IJ SOLN
10.0000 mg | Freq: Once | INTRAMUSCULAR | Status: AC
  Administered 2016-04-16: 10 mg via INTRAVENOUS

## 2016-04-16 MED ORDER — ACETAMINOPHEN 650 MG RE SUPP: 650.0000 mg | Freq: Four times a day (QID) | RECTAL | Status: DC | PRN

## 2016-04-16 MED ORDER — MENTHOL 3 MG MT LOZG: 1.0000 | LOZENGE | OROMUCOSAL | Status: DC | PRN

## 2016-04-16 MED ORDER — FENTANYL CITRATE (PF) 100 MCG/2ML IJ SOLN
INTRAMUSCULAR | Status: DC | PRN
  Administered 2016-04-16 (×3): 50 ug via INTRAVENOUS
  Administered 2016-04-16: 25 ug via INTRAVENOUS
  Administered 2016-04-16: 50 ug via INTRAVENOUS
  Administered 2016-04-16: 100 ug via INTRAVENOUS
  Administered 2016-04-16: 25 ug via INTRAVENOUS
  Administered 2016-04-16: 50 ug via INTRAVENOUS

## 2016-04-16 MED ORDER — METHOCARBAMOL 1000 MG/10ML IJ SOLN
500.0000 mg | Freq: Four times a day (QID) | INTRAVENOUS | Status: DC | PRN
  Administered 2016-04-16: 500 mg via INTRAVENOUS
  Filled 2016-04-16: qty 550
  Filled 2016-04-16: qty 5

## 2016-04-16 MED ORDER — LACTATED RINGERS IV SOLN
INTRAVENOUS | Status: DC
  Administered 2016-04-16 (×2): via INTRAVENOUS

## 2016-04-16 MED ORDER — ONDANSETRON HCL 4 MG/2ML IJ SOLN
INTRAMUSCULAR | Status: DC | PRN
  Administered 2016-04-16: 4 mg via INTRAVENOUS

## 2016-04-16 MED ORDER — SODIUM CHLORIDE 0.9 % IJ SOLN
INTRAMUSCULAR | Status: AC
  Filled 2016-04-16: qty 50

## 2016-04-16 MED ORDER — DOCUSATE SODIUM 100 MG PO CAPS
100.0000 mg | ORAL_CAPSULE | Freq: Two times a day (BID) | ORAL | Status: DC
  Administered 2016-04-16 – 2016-04-17 (×2): 100 mg via ORAL
  Filled 2016-04-16 (×2): qty 1

## 2016-04-16 MED ORDER — MIDAZOLAM HCL 2 MG/2ML IJ SOLN
INTRAMUSCULAR | Status: AC
  Filled 2016-04-16: qty 2

## 2016-04-16 MED ORDER — DEXAMETHASONE SODIUM PHOSPHATE 10 MG/ML IJ SOLN
10.0000 mg | Freq: Once | INTRAMUSCULAR | Status: DC
  Filled 2016-04-16: qty 1

## 2016-04-16 MED ORDER — ONDANSETRON HCL 4 MG/2ML IJ SOLN
INTRAMUSCULAR | Status: AC
  Filled 2016-04-16: qty 2

## 2016-04-16 MED ORDER — ONDANSETRON HCL 4 MG PO TABS: 4.0000 mg | ORAL_TABLET | Freq: Four times a day (QID) | ORAL | Status: DC | PRN

## 2016-04-16 MED ORDER — METOCLOPRAMIDE HCL 5 MG/ML IJ SOLN
INTRAMUSCULAR | Status: DC | PRN
  Administered 2016-04-16: 10 mg via INTRAVENOUS

## 2016-04-16 MED ORDER — CEFAZOLIN SODIUM-DEXTROSE 2-4 GM/100ML-% IV SOLN
2.0000 g | Freq: Four times a day (QID) | INTRAVENOUS | Status: AC
  Administered 2016-04-16 – 2016-04-17 (×2): 2 g via INTRAVENOUS
  Filled 2016-04-16 (×2): qty 100

## 2016-04-16 MED ORDER — OXYCODONE HCL 5 MG PO TABS
5.0000 mg | ORAL_TABLET | ORAL | Status: DC | PRN
  Administered 2016-04-16 (×2): 5 mg via ORAL
  Administered 2016-04-17: 10 mg via ORAL
  Administered 2016-04-17: 5 mg via ORAL
  Administered 2016-04-17 (×2): 10 mg via ORAL
  Filled 2016-04-16 (×4): qty 2
  Filled 2016-04-16: qty 1
  Filled 2016-04-16: qty 2

## 2016-04-16 MED ORDER — TRANEXAMIC ACID 1000 MG/10ML IV SOLN
1000.0000 mg | INTRAVENOUS | Status: AC
  Administered 2016-04-16: 1000 mg via INTRAVENOUS
  Filled 2016-04-16: qty 1100

## 2016-04-16 MED ORDER — CITALOPRAM HYDROBROMIDE 20 MG PO TABS
20.0000 mg | ORAL_TABLET | Freq: Every day | ORAL | Status: DC
  Administered 2016-04-17: 20 mg via ORAL
  Filled 2016-04-16: qty 1

## 2016-04-16 MED ORDER — DIPHENHYDRAMINE HCL 12.5 MG/5ML PO ELIX: 12.5000 mg | ORAL_SOLUTION | ORAL | Status: DC | PRN

## 2016-04-16 MED ORDER — FLEET ENEMA 7-19 GM/118ML RE ENEM: 1.0000 | ENEMA | Freq: Once | RECTAL | Status: DC | PRN

## 2016-04-16 MED ORDER — CHLORHEXIDINE GLUCONATE 4 % EX LIQD
60.0000 mL | Freq: Once | CUTANEOUS | Status: AC
  Administered 2016-04-16: 4 via TOPICAL

## 2016-04-16 MED ORDER — HYDROCHLOROTHIAZIDE 25 MG PO TABS
25.0000 mg | ORAL_TABLET | Freq: Every day | ORAL | Status: DC
  Administered 2016-04-17: 25 mg via ORAL
  Filled 2016-04-16: qty 1

## 2016-04-16 MED ORDER — METHOCARBAMOL 500 MG PO TABS
500.0000 mg | ORAL_TABLET | Freq: Four times a day (QID) | ORAL | Status: DC | PRN
  Administered 2016-04-17: 500 mg via ORAL
  Filled 2016-04-16: qty 1

## 2016-04-16 MED ORDER — ROPIVACAINE HCL 5 MG/ML IJ SOLN
INTRAMUSCULAR | Status: DC | PRN
  Administered 2016-04-16: 30 mL via PERINEURAL

## 2016-04-16 MED ORDER — BUPIVACAINE LIPOSOME 1.3 % IJ SUSP
20.0000 mL | Freq: Once | INTRAMUSCULAR | Status: DC
  Filled 2016-04-16: qty 20

## 2016-04-16 MED ORDER — METOCLOPRAMIDE HCL 5 MG/ML IJ SOLN: 5.0000 mg | Freq: Three times a day (TID) | INTRAMUSCULAR | Status: DC | PRN

## 2016-04-16 MED ORDER — DEXAMETHASONE SODIUM PHOSPHATE 10 MG/ML IJ SOLN
INTRAMUSCULAR | Status: AC
  Filled 2016-04-16: qty 1

## 2016-04-16 MED ORDER — ACETAMINOPHEN 500 MG PO TABS
1000.0000 mg | ORAL_TABLET | Freq: Four times a day (QID) | ORAL | Status: AC
  Administered 2016-04-16 – 2016-04-17 (×4): 1000 mg via ORAL
  Filled 2016-04-16 (×4): qty 2

## 2016-04-16 MED ORDER — HYDROMORPHONE HCL 1 MG/ML IJ SOLN
INTRAMUSCULAR | Status: AC
  Administered 2016-04-16: 0.5 mg via INTRAVENOUS
  Filled 2016-04-16: qty 1

## 2016-04-16 MED ORDER — ACETAMINOPHEN 10 MG/ML IV SOLN
1000.0000 mg | Freq: Once | INTRAVENOUS | Status: AC
  Administered 2016-04-16: 1000 mg via INTRAVENOUS
  Filled 2016-04-16: qty 100

## 2016-04-16 MED ORDER — POLYETHYLENE GLYCOL 3350 17 G PO PACK: 17.0000 g | PACK | Freq: Every day | ORAL | Status: DC | PRN

## 2016-04-16 MED ORDER — ACETAMINOPHEN 325 MG PO TABS: 650.0000 mg | ORAL_TABLET | Freq: Four times a day (QID) | ORAL | Status: DC | PRN

## 2016-04-16 MED ORDER — BISACODYL 10 MG RE SUPP: 10.0000 mg | Freq: Every day | RECTAL | Status: DC | PRN

## 2016-04-16 MED ORDER — ALPRAZOLAM 0.5 MG PO TABS: 0.5000 mg | ORAL_TABLET | Freq: Every day | ORAL | Status: DC | PRN

## 2016-04-16 MED ORDER — MORPHINE SULFATE (PF) 4 MG/ML IV SOLN: 1.0000 mg | INTRAVENOUS | Status: DC | PRN

## 2016-04-16 MED ORDER — PROPOFOL 10 MG/ML IV BOLUS
INTRAVENOUS | Status: AC
  Filled 2016-04-16: qty 40

## 2016-04-16 SURGICAL SUPPLY — 74 items
BAG DECANTER FOR FLEXI CONT (MISCELLANEOUS) IMPLANT
BAG ZIPLOCK 12X15 (MISCELLANEOUS) IMPLANT
BANDAGE ACE 6X5 VEL STRL LF (GAUZE/BANDAGES/DRESSINGS) ×2 IMPLANT
BIT DRILL 2.8X128 (BIT) IMPLANT
BLADE EXTENDED COATED 6.5IN (ELECTRODE) IMPLANT
BLADE SAG 18X100X1.27 (BLADE) IMPLANT
BLADE SAW SAG 73X25 THK (BLADE)
BLADE SAW SGTL 11.0X1.19X90.0M (BLADE) IMPLANT
BLADE SAW SGTL 73X25 THK (BLADE) IMPLANT
BRUSH FEMORAL CANAL (MISCELLANEOUS) IMPLANT
CLOTH BEACON ORANGE TIMEOUT ST (SAFETY) IMPLANT
CUFF TOURN SGL QUICK 34 (TOURNIQUET CUFF) ×1
CUFF TRNQT CYL 34X4X40X1 (TOURNIQUET CUFF) ×1 IMPLANT
DRAPE INCISE IOBAN 66X45 STRL (DRAPES) IMPLANT
DRAPE ORTHO SPLIT 77X108 STRL (DRAPES)
DRAPE POUCH INSTRU U-SHP 10X18 (DRAPES) IMPLANT
DRAPE SURG ORHT 6 SPLT 77X108 (DRAPES) IMPLANT
DRAPE U-SHAPE 47X51 STRL (DRAPES) ×2 IMPLANT
DRSG ADAPTIC 3X8 NADH LF (GAUZE/BANDAGES/DRESSINGS) ×2 IMPLANT
DRSG EMULSION OIL 3X16 NADH (GAUZE/BANDAGES/DRESSINGS) ×2 IMPLANT
DRSG MEPILEX BORDER 4X4 (GAUZE/BANDAGES/DRESSINGS) IMPLANT
DRSG MEPILEX BORDER 4X8 (GAUZE/BANDAGES/DRESSINGS) IMPLANT
DRSG PAD ABDOMINAL 8X10 ST (GAUZE/BANDAGES/DRESSINGS) ×2 IMPLANT
DURAPREP 26ML APPLICATOR (WOUND CARE) ×2 IMPLANT
ELECT REM PT RETURN 9FT ADLT (ELECTROSURGICAL) ×2
ELECTRODE REM PT RTRN 9FT ADLT (ELECTROSURGICAL) ×1 IMPLANT
EVACUATOR 1/8 PVC DRAIN (DRAIN) ×2 IMPLANT
FACESHIELD WRAPAROUND (MASK) IMPLANT
GAUZE SPONGE 4X4 12PLY STRL (GAUZE/BANDAGES/DRESSINGS) ×2 IMPLANT
GLOVE BIO SURGEON STRL SZ7.5 (GLOVE) ×2 IMPLANT
GLOVE BIO SURGEON STRL SZ8 (GLOVE) ×2 IMPLANT
GLOVE BIOGEL PI IND STRL 8 (GLOVE) ×2 IMPLANT
GLOVE BIOGEL PI INDICATOR 8 (GLOVE) ×2
GLOVE SURG SS PI 6.5 STRL IVOR (GLOVE) IMPLANT
GOWN STRL REUS W/TWL LRG LVL3 (GOWN DISPOSABLE) ×2 IMPLANT
GOWN STRL REUS W/TWL XL LVL3 (GOWN DISPOSABLE) ×2 IMPLANT
HANDPIECE INTERPULSE COAX TIP (DISPOSABLE) ×1
IMMOBILIZER KNEE 20 (SOFTGOODS) ×2
IMMOBILIZER KNEE 20 THIGH 36 (SOFTGOODS) ×1 IMPLANT
INSERT TIB PFC SIG SZ 4 20.0M (Knees) ×2 IMPLANT
MANIFOLD NEPTUNE II (INSTRUMENTS) ×2 IMPLANT
MARKER SKIN DUAL TIP RULER LAB (MISCELLANEOUS) IMPLANT
NDL SAFETY ECLIPSE 18X1.5 (NEEDLE) IMPLANT
NEEDLE HYPO 18GX1.5 SHARP (NEEDLE)
NS IRRIG 1000ML POUR BTL (IV SOLUTION) ×2 IMPLANT
PACK TOTAL KNEE CUSTOM (KITS) ×2 IMPLANT
PADDING CAST COTTON 6X4 STRL (CAST SUPPLIES) ×4 IMPLANT
PASSER SUT SWANSON 36MM LOOP (INSTRUMENTS) IMPLANT
POSITIONER SURGICAL ARM (MISCELLANEOUS) ×2 IMPLANT
PRESSURIZER FEMORAL UNIV (MISCELLANEOUS) IMPLANT
SET HNDPC FAN SPRY TIP SCT (DISPOSABLE) ×1 IMPLANT
SPONGE LAP 18X18 X RAY DECT (DISPOSABLE) IMPLANT
STAPLER VISISTAT 35W (STAPLE) ×2 IMPLANT
SUCTION FRAZIER HANDLE 10FR (MISCELLANEOUS)
SUCTION TUBE FRAZIER 10FR DISP (MISCELLANEOUS) IMPLANT
SUT ETHIBOND NAB CT1 #1 30IN (SUTURE) IMPLANT
SUT STRATAFIX 0 PDS 27 VIOLET (SUTURE) ×2
SUT VIC AB 1 CT1 27 (SUTURE)
SUT VIC AB 1 CT1 27XBRD ANTBC (SUTURE) IMPLANT
SUT VIC AB 2-0 CT1 27 (SUTURE) ×3
SUT VIC AB 2-0 CT1 TAPERPNT 27 (SUTURE) ×3 IMPLANT
SUT VLOC 180 0 24IN GS25 (SUTURE) IMPLANT
SUTURE STRATFX 0 PDS 27 VIOLET (SUTURE) ×1 IMPLANT
SWAB COLLECTION DEVICE MRSA (MISCELLANEOUS) IMPLANT
SWAB CULTURE ESWAB REG 1ML (MISCELLANEOUS) IMPLANT
SYR 50ML LL SCALE MARK (SYRINGE) IMPLANT
TOWEL OR 17X26 10 PK STRL BLUE (TOWEL DISPOSABLE) IMPLANT
TOWER CARTRIDGE SMART MIX (DISPOSABLE) IMPLANT
TRAY FOLEY CATH 14FRSI W/METER (CATHETERS) ×2 IMPLANT
TRAY FOLEY W/METER SILVER 16FR (SET/KITS/TRAYS/PACK) IMPLANT
TUBE KAMVAC SUCTION (TUBING) IMPLANT
WATER STERILE IRR 1500ML POUR (IV SOLUTION) IMPLANT
WRAP KNEE MAXI GEL POST OP (GAUZE/BANDAGES/DRESSINGS) ×2 IMPLANT
YANKAUER SUCT BULB TIP 10FT TU (MISCELLANEOUS) ×2 IMPLANT

## 2016-04-16 NOTE — Anesthesia Procedure Notes (Signed)
Anesthesia Regional Block: Adductor canal block   Pre-Anesthetic Checklist: ,, timeout performed, Correct Patient, Correct Site, Correct Laterality, Correct Procedure, Correct Position, site marked, Risks and benefits discussed,  Surgical consent,  Pre-op evaluation,  At surgeon's request and post-op pain management  Laterality: Left  Prep: chloraprep       Needles:  Injection technique: Single-shot  Needle Type: Stimiplex     Needle Length: 9cm      Additional Needles:   Procedures: ultrasound guided,,,,,,,,  Narrative:  Start time: 04/16/2016 12:35 PM End time: 04/16/2016 12:45 PM Injection made incrementally with aspirations every 5 mL.  Performed by: Personally  Anesthesiologist: Anavey Coombes  Additional Notes: Patient tolerated the procedure well without complications

## 2016-04-16 NOTE — H&P (View-Only) (Signed)
Katrina Huffman DOB: Dec 19, 1955 Divorced / Language: English / Race: White Female Date of Admission:  04/16/2016 CC:  Left knee clunk History of Present Illness The patient is a 61 year old female who comes in for a preoperative History and Physical. The patient is scheduled for a left total knee polyethtylene revision to be performed by Dr. Dione Plover. Aluisio, MD at [facility] on 04/16/2016. Patient is S/P knee arthroscopy with synovectomy. Overall the patient feels that the left knee is worse since the beginning of September when she states the knee started "crunching" again. The patient does report that the pain is becoming more constant.  She has had the total knee in for abour 4 years now and developed a patellar clunk issue. She had the knee arthroscpy which helped for about five months. Unfortunately, the crunching has returned and is now much more pronounced and also quite painful at this time which she did not have prior to the previous knee scope and debridment. Her left knee has again developed a painful popping. She has developed the painful popping again. It is very unusual for this to recur and only time I have ever seen to recur was secondary to instability. I think the only way, we can potentially alleviate or eliminate this is to place a thicker polyethylene. She does have some laxity in flexion and extension thus I think we can fix with just thicker poly and not do full blown revision. She is subsequently admitted for revision procedure. Risks and benefits of the surgery have been discussed with the patient and they elect to proceed with surgery.  There are on active contraindications to upcoming procedure such as ongoing infection or progressive neurological disease.   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)  Obesity  Sleep Apnea  uses  CPAP Hypertension  Menopause  Allergies  No Known Drug Allergies  Family History Cancer  father and brother Hypertension  mother, father, grandmother mothers side and grandfather mothers side  Social History Alcohol use  current drinker; only occasionally per week Children  2 Current work status  working full time Drug/Alcohol Rehab (Currently)  no Drug/Alcohol Rehab (Previously)  no Exercise  Exercises rarely Illicit drug use  no Living situation  live alone Marital status  divorced Number of flights of stairs before winded  1 Pain Contract  no Tobacco use  never smoker  Medication History Citalopram Hydrobromide (20MG  Tablet, Oral) Active. ALPRAZolam (0.5MG  Tablet, Oral) Active. (prn) Vitamin D (Oral) Specific strength unknown - Active. Meloxicam (15MG  Tablet, Oral) Active. HydroCHLOROthiazide (25MG  Tablet, Oral) Active.  Past Surgical History Cesarean Delivery  2 times Dilation and Curettage of Uterus - Multiple  Foot Surgery  left Leg Circulation Surgery  left Tonsillectomy  Total Knee Replacement  left Tubal Ligation     Review of Systems General Not Present- Chills, Fatigue, Fever, Memory Loss, Night Sweats, Weight Gain and Weight Loss. Skin Not Present- Eczema, Hives, Itching, Lesions and Rash. HEENT Not Present- Dentures, Double Vision, Headache, Hearing Loss, Tinnitus and Visual Loss. Respiratory Not Present- Allergies, Chronic Cough, Coughing up blood, Shortness of breath at rest and Shortness of breath with exertion. Cardiovascular Not Present- Chest Pain, Difficulty Breathing Lying Down, Murmur, Palpitations, Racing/skipping heartbeats and Swelling. Gastrointestinal Not Present- Abdominal Pain, Bloody Stool, Constipation, Diarrhea, Difficulty Swallowing, Heartburn, Jaundice, Loss of appetitie, Nausea and Vomiting. Female Genitourinary Not Present- Blood in Urine, Discharge, Flank Pain,  Incontinence, Painful Urination, Urgency,  Urinary frequency, Urinary Retention, Urinating at Night and Weak urinary stream. Musculoskeletal Present- Back Pain (has scoliosis) and Joint Pain. Not Present- Joint Swelling, Morning Stiffness, Muscle Pain, Muscle Weakness and Spasms. Neurological Not Present- Blackout spells, Difficulty with balance, Dizziness, Paralysis, Tremor and Weakness. Psychiatric Not Present- Insomnia.  Vitals Weight: 278 lb Height: 65in Body Surface Area: 2.28 m Body Mass Index: 46.26 kg/m  Pulse: 72 (Regular)  BP: 118/72 (Sitting, Right Arm, Standard)   Physical Exam General Mental Status -Alert, cooperative and good historian. General Appearance-pleasant, Not in acute distress. Orientation-Oriented X3. Build & Nutrition-Well nourished and Well developed.  Head and Neck Head-normocephalic, atraumatic . Neck Global Assessment - supple, no bruit auscultated on the right, no bruit auscultated on the left.  Eye Vision-Wears corrective lenses. Pupil - Bilateral-Regular and Round. Motion - Bilateral-EOMI.  Chest and Lung Exam Auscultation Breath sounds - clear at anterior chest wall and clear at posterior chest wall. Adventitious sounds - No Adventitious sounds.  Cardiovascular Auscultation Rhythm - Regular rate and rhythm. Heart Sounds - S1 WNL and S2 WNL. Murmurs & Other Heart Sounds - Auscultation of the heart reveals - No Murmurs.  Abdomen Inspection Contour - Generalized moderate distention. Palpation/Percussion Tenderness - Abdomen is non-tender to palpation. Rigidity (guarding) - Abdomen is soft. Auscultation Auscultation of the abdomen reveals - Bowel sounds normal.  Female Genitourinary Note: Not done, not pertinent to present illness   Musculoskeletal Note: She is in no distress. Left knee, no effusion. Her range is about 0 to 125. She has significant crepitus when going from a flexed to an extended position. She has a moderate amount of varus and valgus  and AP laxity.   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 Glori Bickers - Patient has been seen preoperatively and felt to be stable for surgery.  IV TXA  Anesthesia Issues: None except occasional nausea  Patient was instructed on what medications to stop prior to surgery.

## 2016-04-16 NOTE — Interval H&P Note (Signed)
History and Physical Interval Note:  04/16/2016 12:21 PM  Katrina Huffman  has presented today for surgery, with the diagnosis of left total knee arthroplasty patella clunk syndrome  The various methods of treatment have been discussed with the patient and family. After consideration of risks, benefits and other options for treatment, the patient has consented to  Procedure(s): LEFT TOTAL KNEE ARTHROPLASTY WITH POLY REVISION (Left) as a surgical intervention .  The patient's history has been reviewed, patient examined, no change in status, stable for surgery.  I have reviewed the patient's chart and labs.  Questions were answered to the patient's satisfaction.     [name]

## 2016-04-16 NOTE — Progress Notes (Signed)
Pt states that she will self administer CPAP when ready for bed.  RT to monitor and assess as needed.  

## 2016-04-16 NOTE — Progress Notes (Signed)
Assisted Dr. Rose with left, ultrasound guided, adductor canal block. Side rails up, monitors on throughout procedure. See vital signs in flow sheet. Tolerated Procedure well.  

## 2016-04-16 NOTE — Transfer of Care (Signed)
Immediate Anesthesia Transfer of Care Note  Patient: Katrina Huffman  Procedure(s) Performed: Procedure(s) with comments: LEFT TOTAL KNEE ARTHROPLASTY WITH POLY REVISION (Left) - with abductor block  Patient Location: PACU  Anesthesia Type:General  Level of Consciousness: awake, alert  and oriented  Airway & Oxygen Therapy: Patient Spontanous Breathing and Patient connected to face mask oxygen  Post-op Assessment: Report given to RN and Post -op Vital signs reviewed and stable  Post vital signs: Reviewed and stable  Last Vitals:  Vitals:   04/16/16 1346 04/16/16 1347  BP:    Pulse: 69 70  Resp: 15 16  Temp:      Last Pain:  Vitals:   04/16/16 1247  TempSrc:   PainSc: 3       Patients Stated Pain Goal: 3 (34/74/25 9563)  Complications: No apparent anesthesia complications

## 2016-04-16 NOTE — Anesthesia Preprocedure Evaluation (Addendum)
Anesthesia Evaluation  Patient identified by MRN, date of birth, ID band Patient awake    Reviewed: Allergy & Precautions, NPO status , Patient's Chart, lab work & pertinent test results  Airway Mallampati: II  TM Distance: <3 FB Neck ROM: Full    Dental no notable dental hx.    Pulmonary sleep apnea and Continuous Positive Airway Pressure Ventilation ,    Pulmonary exam normal breath sounds clear to auscultation       Cardiovascular hypertension, Normal cardiovascular exam Rhythm:Regular Rate:Normal     Neuro/Psych negative neurological ROS  negative psych ROS   GI/Hepatic negative GI ROS, Neg liver ROS,   Endo/Other  Morbid obesity  Renal/GU negative Renal ROS  negative genitourinary   Musculoskeletal negative musculoskeletal ROS (+)   Abdominal   Peds negative pediatric ROS (+)  Hematology negative hematology ROS (+)   Anesthesia Other Findings   Reproductive/Obstetrics negative OB ROS                             Anesthesia Physical Anesthesia Plan  ASA: III  Anesthesia Plan: Spinal   Post-op Pain Management:    Induction: Intravenous  Airway Management Planned: Simple Face Mask  Additional Equipment:   Intra-op Plan:   Post-operative Plan:   Informed Consent: I have reviewed the patients History and Physical, chart, labs and discussed the procedure including the risks, benefits and alternatives for the proposed anesthesia with the patient or authorized representative who has indicated his/her understanding and acceptance.   Dental advisory given  Plan Discussed with: CRNA and Surgeon  Anesthesia Plan Comments:         Anesthesia Quick Evaluation

## 2016-04-16 NOTE — Brief Op Note (Signed)
04/16/2016  3:31 PM  PATIENT:  [name]  61 y.o. female  PRE-OPERATIVE DIAGNOSIS:  Unstable left total knee arthroplasty   POST-OPERATIVE DIAGNOSIS:  Unstable left total knee arthroplasty   PROCEDURE:  Left knee polyethylene revision  SURGEON:  Surgeon(s) and Role:    * [name], MD - Primary  PHYSICIAN ASSISTANT:   ASSISTANTS: [name], PA-C   ANESTHESIA:   Adductor canal block and General  EBL:  Total I/O In: 1000 [I.V.:1000] Out: 250 [Urine:200; Blood:50]  BLOOD ADMINISTERED:none  DRAINS: (Medium) Hemovact drain(s) in the left knee\ with  Suction Open   LOCAL MEDICATIONS USED:  OTHER Exparel  COUNTS:  YES  TOURNIQUET:   Total Tourniquet Time Documented: Thigh (Right) - 18 minutes Total: Thigh (Right) - 18 minutes   DICTATION: .Other Dictation: Dictation Number [id number]  PLAN OF CARE: Admit for overnight observation  PATIENT DISPOSITION:  PACU - hemodynamically stable.

## 2016-04-16 NOTE — Anesthesia Procedure Notes (Signed)
Procedure Name: LMA Insertion Date/Time: 04/16/2016 2:33 PM Performed by: Glory Buff Pre-anesthesia Checklist: Patient identified, Emergency Drugs available, Suction available and Patient being monitored Patient Re-evaluated:Patient Re-evaluated prior to inductionOxygen Delivery Method: Circle system utilized Preoxygenation: Pre-oxygenation with 100% oxygen Intubation Type: IV induction Ventilation: Mask ventilation without difficulty LMA: LMA with gastric port inserted LMA Size: 4.0 Number of attempts: 1 Placement Confirmation: positive ETCO2 Tube secured with: Tape Dental Injury: Teeth and Oropharynx as per pre-operative assessment

## 2016-04-17 DIAGNOSIS — G4730 Sleep apnea, unspecified: Secondary | ICD-10-CM | POA: Diagnosis not present

## 2016-04-17 DIAGNOSIS — R262 Difficulty in walking, not elsewhere classified: Secondary | ICD-10-CM | POA: Diagnosis not present

## 2016-04-17 DIAGNOSIS — M419 Scoliosis, unspecified: Secondary | ICD-10-CM | POA: Diagnosis not present

## 2016-04-17 DIAGNOSIS — M1711 Unilateral primary osteoarthritis, right knee: Secondary | ICD-10-CM | POA: Diagnosis not present

## 2016-04-17 DIAGNOSIS — I10 Essential (primary) hypertension: Secondary | ICD-10-CM | POA: Diagnosis not present

## 2016-04-17 DIAGNOSIS — F329 Major depressive disorder, single episode, unspecified: Secondary | ICD-10-CM | POA: Diagnosis not present

## 2016-04-17 DIAGNOSIS — T84023A Instability of internal left knee prosthesis, initial encounter: Secondary | ICD-10-CM | POA: Diagnosis not present

## 2016-04-17 DIAGNOSIS — Z6841 Body Mass Index (BMI) 40.0 and over, adult: Secondary | ICD-10-CM | POA: Diagnosis not present

## 2016-04-17 DIAGNOSIS — Z7982 Long term (current) use of aspirin: Secondary | ICD-10-CM | POA: Diagnosis not present

## 2016-04-17 LAB — CBC
HEMATOCRIT: 35 % — AB (ref 36.0–46.0)
HEMOGLOBIN: 11.5 g/dL — AB (ref 12.0–15.0)
MCH: 27.8 pg (ref 26.0–34.0)
MCHC: 32.9 g/dL (ref 30.0–36.0)
MCV: 84.5 fL (ref 78.0–100.0)
Platelets: 235 10*3/uL (ref 150–400)
RBC: 4.14 MIL/uL (ref 3.87–5.11)
RDW: 13.6 % (ref 11.5–15.5)
WBC: 10 10*3/uL (ref 4.0–10.5)

## 2016-04-17 LAB — BASIC METABOLIC PANEL
Anion gap: 7 (ref 5–15)
BUN: 16 mg/dL (ref 6–20)
CHLORIDE: 107 mmol/L (ref 101–111)
CO2: 27 mmol/L (ref 22–32)
Calcium: 8.8 mg/dL — ABNORMAL LOW (ref 8.9–10.3)
Creatinine, Ser: 0.61 mg/dL (ref 0.44–1.00)
GFR calc Af Amer: >60 mL/min (ref >60)
GFR calc non Af Amer: >60 mL/min (ref >60)
Glucose, Bld: 181 mg/dL — ABNORMAL HIGH (ref 65–99)
POTASSIUM: 4 mmol/L (ref 3.5–5.1)
SODIUM: 141 mmol/L (ref 135–145)

## 2016-04-17 MED ORDER — OXYCODONE HCL 5 MG PO TABS: 5.0000 mg | ORAL_TABLET | ORAL | 0 refills | Status: DC | PRN

## 2016-04-17 MED ORDER — SODIUM CHLORIDE 0.9 % IV BOLUS (SEPSIS)
250.0000 mL | Freq: Once | INTRAVENOUS | Status: AC
  Administered 2016-04-17: 250 mL via INTRAVENOUS

## 2016-04-17 MED ORDER — METHOCARBAMOL 500 MG PO TABS: 500.0000 mg | ORAL_TABLET | Freq: Four times a day (QID) | ORAL | 0 refills | Status: DC | PRN

## 2016-04-17 MED ORDER — ASPIRIN 325 MG PO TBEC: 325.0000 mg | DELAYED_RELEASE_TABLET | Freq: Two times a day (BID) | ORAL | 0 refills | Status: DC

## 2016-04-17 NOTE — Discharge Instructions (Signed)
° °Dr. Frank Aluisio °Total Joint Specialist °[facility] °[address] °[place], [place] [address] °[phone] ° °TOTAL KNEE REPLACEMENT POSTOPERATIVE DIRECTIONS ° °Knee Rehabilitation, Guidelines Following Surgery  °Results after knee surgery are often greatly improved when you follow the exercise, range of motion and muscle strengthening exercises prescribed by your doctor. Safety measures are also important to protect the knee from further injury. Any time any of these exercises cause you to have increased pain or swelling in your knee joint, decrease the amount until you are comfortable again and slowly increase them. If you have problems or questions, call your caregiver or physical therapist for advice.  ° °HOME CARE INSTRUCTIONS  °Remove items at home which could result in a fall. This includes throw rugs or furniture in walking pathways.  °· ICE to the affected knee every three hours for 30 minutes at a time and then as needed for pain and swelling.  Continue to use ice on the knee for pain and swelling from surgery. You may notice swelling that will progress down to the foot and ankle.  This is normal after surgery.  Elevate the leg when you are not up walking on it.   °· Continue to use the breathing machine which will help keep your temperature down.  It is common for your temperature to cycle up and down following surgery, especially at night when you are not up moving around and exerting yourself.  The breathing machine keeps your lungs expanded and your temperature down. °· Do not place pillow under knee, focus on keeping the knee straight while resting ° °DIET °You may resume your previous home diet once your are discharged from the hospital. ° °DRESSING / WOUND CARE / SHOWERING °You may shower 3 days after surgery, but keep the wounds dry during showering.  You may use an occlusive plastic wrap (Press'n Seal for example), NO SOAKING/SUBMERGING IN THE BATHTUB.  If the  bandage gets wet, change with a clean dry gauze.  If the incision gets wet, pat the wound dry with a clean towel. °You may start showering once you are discharged home but do not submerge the incision under water. Just pat the incision dry and apply a dry gauze dressing on daily. °Change the surgical dressing daily and reapply a dry dressing each time. ° °ACTIVITY °Walk with your walker as instructed. °Use walker as long as suggested by your caregivers. °Avoid periods of inactivity such as sitting longer than an hour when not asleep. This helps prevent blood clots.  °You may resume a sexual relationship in one month or when given the OK by your doctor.  °You may return to work once you are cleared by your doctor.  °Do not drive a car for 6 weeks or until released by you surgeon.  °Do not drive while taking narcotics. ° °WEIGHT BEARING °Weight bearing as tolerated with assist device (walker, cane, etc) as directed, use it as long as suggested by your surgeon or therapist, typically at least 4-6 weeks. ° °POSTOPERATIVE CONSTIPATION PROTOCOL °Constipation - defined medically as fewer than three stools per week and severe constipation as less than one stool per week. ° °One of the most common issues patients have following surgery is constipation.  Even if you have a regular bowel pattern at home, your normal regimen is likely to be disrupted due to multiple reasons following surgery.  Combination of anesthesia, postoperative narcotics, change in appetite and fluid intake all can affect your bowels.    In order to avoid complications following surgery, here are some recommendations in order to help you during your recovery period. ° °Colace (docusate) - Pick up an over-the-counter form of Colace or another stool softener and take twice a day as long as you are requiring postoperative pain medications.  Take with a full glass of water daily.  If you experience loose stools or diarrhea, hold the colace until you stool forms  back up.  If your symptoms do not get better within 1 week or if they get worse, check with your doctor. ° °Dulcolax (bisacodyl) - Pick up over-the-counter and take as directed by the product packaging as needed to assist with the movement of your bowels.  Take with a full glass of water.  Use this product as needed if not relieved by Colace only.  ° °MiraLax (polyethylene glycol) - Pick up over-the-counter to have on hand.  MiraLax is a solution that will increase the amount of water in your bowels to assist with bowel movements.  Take as directed and can mix with a glass of water, juice, soda, coffee, or tea.  Take if you go more than two days without a movement. °Do not use MiraLax more than once per day. Call your doctor if you are still constipated or irregular after using this medication for 7 days in a row. ° °If you continue to have problems with postoperative constipation, please contact the office for further assistance and recommendations.  If you experience "the worst abdominal pain ever" or develop nausea or vomiting, please contact the office immediatly for further recommendations for treatment. ° °ITCHING ° If you experience itching with your medications, try taking only a single pain pill, or even half a pain pill at a time.  You can also use Benadryl over the counter for itching or also to help with sleep.  ° °TED HOSE STOCKINGS °Wear the elastic stockings on both legs for three weeks following surgery during the day but you may remove then at night for sleeping. ° °MEDICATIONS °See your medication summary on the “After Visit Summary” that the nursing staff will review with you prior to discharge.  You may have some home medications which will be placed on hold until you complete the course of blood thinner medication.  It is important for you to complete the blood thinner medication as prescribed by your surgeon.  Continue your approved medications as instructed at time of  discharge. ° °PRECAUTIONS °If you experience chest pain or shortness of breath - call 911 immediately for transfer to the hospital emergency department.  °If you develop a fever greater that 101 F, purulent drainage from wound, increased redness or drainage from wound, foul odor from the wound/dressing, or calf pain - CONTACT YOUR SURGEON.   °                                                °FOLLOW-UP APPOINTMENTS °Make sure you keep all of your appointments after your operation with your surgeon and caregivers. You should call the office at the above phone number and make an appointment for approximately two weeks after the date of your surgery or on the date instructed by your surgeon outlined in the "After Visit Summary". ° ° °RANGE OF MOTION AND STRENGTHENING EXERCISES  °Rehabilitation of the knee is important following a knee injury or   an operation. After just a few days of immobilization, the muscles of the thigh which control the knee become weakened and shrink (atrophy). Knee exercises are designed to build up the tone and strength of the thigh muscles and to improve knee motion. Often times heat used for twenty to thirty minutes before working out will loosen up your tissues and help with improving the range of motion but do not use heat for the first two weeks following surgery. These exercises can be done on a training (exercise) mat, on the floor, on a table or on a bed. Use what ever works the best and is most comfortable for you Knee exercises include:  Leg Lifts - While your knee is still immobilized in a splint or cast, you can do straight leg raises. Lift the leg to 60 degrees, hold for 3 sec, and slowly lower the leg. Repeat 10-20 times 2-3 times daily. Perform this exercise against resistance later as your knee gets better.  Quad and Hamstring Sets - Tighten up the muscle on the front of the thigh (Quad) and hold for 5-10 sec. Repeat this 10-20 times hourly. Hamstring sets are done by pushing the  foot backward against an object and holding for 5-10 sec. Repeat as with quad sets.   Leg Slides: Lying on your back, slowly slide your foot toward your buttocks, bending your knee up off the floor (only go as far as is comfortable). Then slowly slide your foot back down until your leg is flat on the floor again.  Angel Wings: Lying on your back spread your legs to the side as far apart as you can without causing discomfort.  A rehabilitation program following serious knee injuries can speed recovery and prevent re-injury in the future due to weakened muscles. Contact your doctor or a physical therapist for more information on knee rehabilitation.   IF YOU ARE TRANSFERRED TO A SKILLED REHAB FACILITY If the patient is transferred to a skilled rehab facility following release from the hospital, a list of the current medications will be sent to the facility for the patient to continue.  When discharged from the skilled rehab facility, please have the facility set up the patient's [place] prior to being released. Also, the skilled facility will be responsible for providing the patient with their medications at time of release from the facility to include their pain medication, the muscle relaxants, and their blood thinner medication. If the patient is still at the rehab facility at time of the two week follow up appointment, the skilled rehab facility will also need to assist the patient in arranging follow up appointment in our office and any transportation needs.  MAKE SURE YOU:  Understand these instructions.  Get help right away if you are not doing well or get worse.    Pick up stool softner and laxative for home use following surgery while on pain medications. Do not submerge incision under water. Please use good hand washing techniques while changing dressing each day. May shower starting three days after surgery. Please use a clean towel to pat the incision dry following  showers. Continue to use ice for pain and swelling after surgery. Do not use any lotions or creams on the incision until instructed by your surgeon.  Take a 325 mg Aspirin twice a day for four weeks at home, then can discontinue the Aspirin 325 mg.

## 2016-04-17 NOTE — Evaluation (Signed)
Physical Therapy Evaluation Patient Details Name: Katrina Huffman MRN: [id number] DOB: 02-Dec-1955 Today's Date: 04/17/2016   History of Present Illness  s/p L knee polyexchange  Clinical Impression  Pt s/p L knee poly-exchange and presents with decreased L LE strength/ROM and post op pain limiting functional mobility.  Pt should progress to dc home with assist of family and follow up HHPT.    Follow Up Recommendations Home health PT    Equipment Recommendations  None recommended by PT    Recommendations for Other Services       Precautions / Restrictions Precautions Precautions: Knee;Fall Required Braces or Orthoses: Knee Immobilizer - Left Knee Immobilizer - Left: Discontinue once straight leg raise with < 10 degree lag (Pt performed IND SLR this am) Restrictions Weight Bearing Restrictions: No Other Position/Activity Restrictions: WBAT      Mobility  Bed Mobility Overal bed mobility: Needs Assistance Bed Mobility: Supine to Sit     Supine to sit: Min assist     General bed mobility comments: cues for sequence and use of R LE to self assist  Transfers Overall transfer level: Needs assistance Equipment used: Rolling walker (2 wheeled) Transfers: Sit to/from Stand Sit to Stand: Min assist         General transfer comment: assist to rise and steady; assist to control descent onto high commode  Ambulation/Gait Ambulation/Gait assistance: Min assist;Min guard Ambulation Distance (Feet): 75 Feet Assistive device: Rolling walker (2 wheeled) Gait Pattern/deviations: Step-to pattern;Step-through pattern;Decreased step length - right;Decreased step length - left;Shuffle;Trunk flexed Gait velocity: decr Gait velocity interpretation: Below normal speed for age/gender General Gait Details: cues for sequence, posture and position from [employer]            Wheelchair Mobility    Modified Rankin (Stroke Patients Only)       Balance                                             Pertinent Vitals/Pain Pain Assessment: 0-10 Pain Score: 4  Pain Location: L knee  Pain Descriptors / Indicators: Tightness;Aching Pain Intervention(s): Limited activity within patient's tolerance;Monitored during session;Premedicated before session;Ice applied    Home Living Family/patient expects to be discharged to:: Private residence Living Arrangements: Alone Available Help at Discharge: Family Type of Home: House Home Access: Stairs to enter   Technical brewer of Steps: 1 Home Layout: One level Home Equipment: Clinical cytogeneticist - 2 wheels Additional Comments: has sink next to high commode    Prior Function Level of Independence: Independent               Hand Dominance        Extremity/Trunk Assessment   Upper Extremity Assessment Upper Extremity Assessment: Overall WFL for tasks assessed    Lower Extremity Assessment Lower Extremity Assessment: LLE deficits/detail LLE Deficits / Details: 3/5 quads with IND SLR and AAROM at knee -10- 50       Communication   Communication: No difficulties  Cognition Arousal/Alertness: Awake/alert Behavior During Therapy: WFL for tasks assessed/performed Overall Cognitive Status: Within Functional Limits for tasks assessed                      General Comments      Exercises Total Joint Exercises Ankle Circles/Pumps: AROM;Both;15 reps;Supine Quad Sets: AROM;Both;10 reps;Supine Heel Slides: AAROM;Left;15 reps;Supine Straight Leg Raises: AAROM;AROM;Left;15  reps;Supine   Assessment/Plan    PT Assessment Patient needs continued PT services  PT Problem List Decreased strength;Decreased range of motion;Decreased activity tolerance;Decreased mobility;Decreased knowledge of use of DME;Obesity;Pain       PT Treatment Interventions DME instruction;Gait training;Stair training;Functional mobility training;Therapeutic activities;Therapeutic exercise;Patient/family  education    PT Goals (Current goals can be found in the Care Plan section)  Acute Rehab PT Goals Patient Stated Goal: return to independence PT Goal Formulation: With patient Time For Goal Achievement: 04/18/16 Potential to Achieve Goals: Good    Frequency 7X/week   Barriers to discharge        Co-evaluation               End of Session   Activity Tolerance: Patient tolerated treatment well Patient left: in chair;with call bell/phone within reach Nurse Communication: Mobility status PT Visit Diagnosis: Difficulty in walking, not elsewhere classified (R26.2)         Time: 4098-1191 PT Time Calculation (min) (ACUTE ONLY): 36 min   Charges:     PT Treatments $Therapeutic Exercise: 8-22 mins   PT G Codes:         Jaskaran Dauzat 04/17/2016, 1:29 PM

## 2016-04-17 NOTE — Discharge Summary (Signed)
Physician Discharge Summary   Patient ID: Katrina Huffman MRN: [id number] DOB/AGE: November 26, 1955 61 y.o.  Admit date: 04/16/2016 Discharge date: 04/17/16  Primary Diagnosis:  Unstable left total knee arthroplasty. Admission Diagnoses:  Past Medical History:  Diagnosis Date  . Arthritis    hands, knees, back  . Depression   . Edema    left leg knee to foot, since veein stripping  . History of chicken pox   . Hypertension   . PONV (postoperative nausea and vomiting)    PONV after knee arthroplasty  . Scoliosis    sees chiropracter monthly  . Shortness of breath dyspnea    "out of shape"  . Sleep apnea    cpap   Discharge Diagnoses:   Principal Problem:   Failed total knee arthroplasty, sequela Active Problems:   Failed total knee arthroplasty ([place])  Estimated body mass index is 46.1 kg/m as calculated from the following:   Height as of this encounter: 5' 5"  (1.651 m).   Weight as of this encounter: 125.6 kg (277 lb).  Procedure:  Procedure(s) (LRB): LEFT TOTAL KNEE ARTHROPLASTY WITH POLY REVISION (Left)   Consults: None  HPI: Katrina Huffman is a 61 year old female, who had a left total knee arthroplasty done in [place] several years ago.  She presented to me about 2 years ago with a patellar clunk syndrome.  This was treated with arthroscopic synovectomy.  The painful popping was eliminated, but she had persistent instability.  She is now having pain related to the instability.  She presents now for polyethylene versus total knee arthroplasty revision. Laboratory Data: Admission on 04/16/2016  Component Date Value Ref Range Status  . WBC 04/17/2016 10.0  4.0 - 10.5 K/uL Final  . RBC 04/17/2016 4.14  3.87 - 5.11 MIL/uL Final  . Hemoglobin 04/17/2016 11.5* 12.0 - 15.0 g/dL Final  . HCT 04/17/2016 35.0* 36.0 - 46.0 % Final  . MCV 04/17/2016 84.5  78.0 - 100.0 fL Final  . MCH 04/17/2016 27.8  26.0 - 34.0 pg Final  . MCHC 04/17/2016 32.9  30.0 - 36.0 g/dL Final  . RDW  04/17/2016 13.6  11.5 - 15.5 % Final  . Platelets 04/17/2016 235  150 - 400 K/uL Final  . Sodium 04/17/2016 141  135 - 145 mmol/L Final  . Potassium 04/17/2016 4.0  3.5 - 5.1 mmol/L Final  . Chloride 04/17/2016 107  101 - 111 mmol/L Final  . CO2 04/17/2016 27  22 - 32 mmol/L Final  . Glucose, Bld 04/17/2016 181* 65 - 99 mg/dL Final  . BUN 04/17/2016 16  6 - 20 mg/dL Final  . Creatinine, Ser 04/17/2016 0.61  0.44 - 1.00 mg/dL Final  . Calcium 04/17/2016 8.8* 8.9 - 10.3 mg/dL Final  . GFR calc non Af Amer 04/17/2016 >60  >60 mL/min Final  . GFR calc Af Amer 04/17/2016 >60  >60 mL/min Final   Comment: (NOTE) The eGFR has been calculated using the CKD EPI equation. This calculation has not been validated in all clinical situations. eGFR's persistently <60 mL/min signify possible Chronic Kidney Disease.   Georgiann Hahn gap 04/17/2016 7  5 - 15 Final  Hospital Outpatient Visit on 04/10/2016  Component Date Value Ref Range Status  . aPTT 04/10/2016 31  24 - 36 seconds Final  . WBC 04/10/2016 7.6  4.0 - 10.5 K/uL Final  . RBC 04/10/2016 4.59  3.87 - 5.11 MIL/uL Final  . Hemoglobin 04/10/2016 12.9  12.0 - 15.0 g/dL Final  . HCT  04/10/2016 39.6  36.0 - 46.0 % Final  . MCV 04/10/2016 86.3  78.0 - 100.0 fL Final  . MCH 04/10/2016 28.1  26.0 - 34.0 pg Final  . MCHC 04/10/2016 32.6  30.0 - 36.0 g/dL Final  . RDW 04/10/2016 13.9  11.5 - 15.5 % Final  . Platelets 04/10/2016 234  150 - 400 K/uL Final  . Sodium 04/10/2016 139  135 - 145 mmol/L Final  . Potassium 04/10/2016 4.7  3.5 - 5.1 mmol/L Final  . Chloride 04/10/2016 106  101 - 111 mmol/L Final  . CO2 04/10/2016 28  22 - 32 mmol/L Final  . Glucose, Bld 04/10/2016 104* 65 - 99 mg/dL Final  . BUN 04/10/2016 24* 6 - 20 mg/dL Final  . Creatinine, Ser 04/10/2016 0.76  0.44 - 1.00 mg/dL Final  . Calcium 04/10/2016 9.2  8.9 - 10.3 mg/dL Final  . Total Protein 04/10/2016 6.8  6.5 - 8.1 g/dL Final  . Albumin 04/10/2016 3.9  3.5 - 5.0 g/dL Final  . AST  04/10/2016 17  15 - 41 U/L Final  . ALT 04/10/2016 17  14 - 54 U/L Final  . Alkaline Phosphatase 04/10/2016 83  38 - 126 U/L Final  . Total Bilirubin 04/10/2016 0.7  0.3 - 1.2 mg/dL Final  . GFR calc non Af Amer 04/10/2016 >60  >60 mL/min Final  . GFR calc Af Amer 04/10/2016 >60  >60 mL/min Final   Comment: (NOTE) The eGFR has been calculated using the CKD EPI equation. This calculation has not been validated in all clinical situations. eGFR's persistently <60 mL/min signify possible Chronic Kidney Disease.   . Anion gap 04/10/2016 5  5 - 15 Final  . Prothrombin Time 04/10/2016 14.5  11.4 - 15.2 seconds Final  . INR 04/10/2016 1.12   Final  . ABO/RH(D) 04/10/2016 O POS   Final  . Antibody Screen 04/10/2016 NEG   Final  . Sample Expiration 04/10/2016 04/19/2016   Final  . MRSA, PCR 04/10/2016 NEGATIVE  NEGATIVE Final  . Staphylococcus aureus 04/10/2016 NEGATIVE  NEGATIVE Final   Comment:        The Xpert SA Assay (FDA approved for NASAL specimens in patients over 63 years of age), is one component of a comprehensive surveillance program.  Test performance has been validated by [facility] for patients greater than or equal to 75 year old. It is not intended to diagnose infection nor to guide or monitor treatment.   . ABO/RH(D) 04/10/2016 O POS   Final  Office Visit on 03/05/2016  Component Date Value Ref Range Status  . HM Mammogram 08/05/2015 Self Reported Normal  0-4 Bi-Rad, Self Reported Normal Final  . HM Pap smear 08/05/2015 nl per pt gyn   Final  . WBC 03/05/2016 8.6  4.0 - 10.5 K/uL Final  . RBC 03/05/2016 5.00  3.87 - 5.11 Mil/uL Final  . Hemoglobin 03/05/2016 14.3  12.0 - 15.0 g/dL Final  . HCT 03/05/2016 42.9  36.0 - 46.0 % Final  . MCV 03/05/2016 85.7  78.0 - 100.0 fl Final  . MCHC 03/05/2016 33.4  30.0 - 36.0 g/dL Final  . RDW 03/05/2016 14.1  11.5 - 15.5 % Final  . Platelets 03/05/2016 257.0  150.0 - 400.0 K/uL Final  . Neutrophils Relative % 03/05/2016 62.1   43.0 - 77.0 % Final  . Lymphocytes Relative 03/05/2016 25.2  12.0 - 46.0 % Final  . Monocytes Relative 03/05/2016 9.6  3.0 - 12.0 % Final  .  Eosinophils Relative 03/05/2016 2.2  0.0 - 5.0 % Final  . Basophils Relative 03/05/2016 0.9  0.0 - 3.0 % Final  . Neutro Abs 03/05/2016 5.3  1.4 - 7.7 K/uL Final  . Lymphs Abs 03/05/2016 2.2  0.7 - 4.0 K/uL Final  . Monocytes Absolute 03/05/2016 0.8  0.1 - 1.0 K/uL Final  . Eosinophils Absolute 03/05/2016 0.2  0.0 - 0.7 K/uL Final  . Basophils Absolute 03/05/2016 0.1  0.0 - 0.1 K/uL Final  . Sodium 03/05/2016 140  135 - 145 mEq/L Final  . Potassium 03/05/2016 4.1  3.5 - 5.1 mEq/L Final  . Chloride 03/05/2016 103  96 - 112 mEq/L Final  . CO2 03/05/2016 30  19 - 32 mEq/L Final  . Glucose, Bld 03/05/2016 112* 70 - 99 mg/dL Final  . BUN 03/05/2016 27* 6 - 23 mg/dL Final  . Creatinine, Ser 03/05/2016 0.82  0.40 - 1.20 mg/dL Final  . Total Bilirubin 03/05/2016 0.4  0.2 - 1.2 mg/dL Final  . Alkaline Phosphatase 03/05/2016 92  39 - 117 U/L Final  . AST 03/05/2016 15  0 - 37 U/L Final  . ALT 03/05/2016 15  0 - 35 U/L Final  . Total Protein 03/05/2016 7.5  6.0 - 8.3 g/dL Final  . Albumin 03/05/2016 4.6  3.5 - 5.2 g/dL Final  . Calcium 03/05/2016 10.0  8.4 - 10.5 mg/dL Final  . GFR 03/05/2016 75.06  >60.00 mL/min Final  . Cholesterol 03/05/2016 175  0 - 200 mg/dL Final  . Triglycerides 03/05/2016 94.0  0.0 - 149.0 mg/dL Final  . HDL 03/05/2016 42.60  >39.00 mg/dL Final  . VLDL 03/05/2016 18.8  0.0 - 40.0 mg/dL Final  . LDL Cholesterol 03/05/2016 114* 0 - 99 mg/dL Final  . Total CHOL/HDL Ratio 03/05/2016 4   Final  . NonHDL 03/05/2016 132.60   Final  . TSH 03/05/2016 2.64  0.35 - 4.50 uIU/mL Final     X-Rays:No results found.  EKG: Orders placed or performed in visit on 03/05/16  . EKG 12-Lead     Hospital Course: Katrina Huffman is a 61 y.o. who was admitted to [facility]. They were brought to the operating room on 04/16/2016 and underwent  Procedure(s): LEFT TOTAL KNEE ARTHROPLASTY WITH POLY REVISION.  Patient tolerated the procedure well and was later transferred to the recovery room and then to the orthopaedic floor for postoperative care.  They were given PO and IV analgesics for pain control following their surgery.  They were given 24 hours of postoperative antibiotics of  Anti-infectives    Start     Dose/Rate Route Frequency Ordered Stop   04/16/16 2030  ceFAZolin (ANCEF) IVPB 2g/100 mL premix     2 g 200 mL/hr over 30 Minutes Intravenous Every 6 hours 04/16/16 1733 04/17/16 0346   04/16/16 0600  ceFAZolin (ANCEF) 3 g in dextrose 5 % 50 mL IVPB     3 g 130 mL/hr over 30 Minutes Intravenous On call to O.R. 04/15/16 1232 04/16/16 1436     and started on DVT prophylaxis in the form of Aspirin.   PT and OT were ordered for total joint protocol.  Discharge planning consulted to help with postop disposition and equipment needs.  Patient had a good night on the evening of surgery.  They started to get up OOB with therapy on day one. Hemovac drain was pulled without difficulty.   Patient was seen in rounds and was ready to go home later that  same day.  Discharge home with home health after met goals Diet - Cardiac diet Follow up - in 2 weeks Activity - WBAT Disposition - Home Condition Upon Discharge - pending therapy D/C Meds - See DC Summary DVT Prophylaxis - Aspirin 325 mg BID  Discharge Instructions    Call MD / Call 911    Complete by:  As directed    If you experience chest pain or shortness of breath, CALL 911 and be transported to the hospital emergency room.  If you develope a fever above 101 F, pus (white drainage) or increased drainage or redness at the wound, or calf pain, call your surgeon's office.   Change dressing    Complete by:  As directed    Change dressing daily with sterile 4 x 4 inch gauze dressing and apply TED hose. Do not submerge the incision under water.   Constipation Prevention    Complete by:   As directed    Drink plenty of fluids.  Prune juice may be helpful.  You may use a stool softener, such as Colace (over the counter) 100 mg twice a day.  Use MiraLax (over the counter) for constipation as needed.   Diet - low sodium heart healthy    Complete by:  As directed    Discharge instructions    Complete by:  As directed    Pick up stool softner and laxative for home use following surgery while on pain medications. Do not submerge incision under water. Please use good hand washing techniques while changing dressing each day. May shower starting three days after surgery. Please use a clean towel to pat the incision dry following showers. Continue to use ice for pain and swelling after surgery. Do not use any lotions or creams on the incision until instructed by your surgeon.  Wear both TED hose on both legs during the day every day for three weeks, but may have off at night at home.  Postoperative Constipation Protocol  Constipation - defined medically as fewer than three stools per week and severe constipation as less than one stool per week.  One of the most common issues patients have following surgery is constipation.  Even if you have a regular bowel pattern at home, your normal regimen is likely to be disrupted due to multiple reasons following surgery.  Combination of anesthesia, postoperative narcotics, change in appetite and fluid intake all can affect your bowels.  In order to avoid complications following surgery, here are some recommendations in order to help you during your recovery period.  Colace (docusate) - Pick up an over-the-counter form of Colace or another stool softener and take twice a day as long as you are requiring postoperative pain medications.  Take with a full glass of water daily.  If you experience loose stools or diarrhea, hold the colace until you stool forms back up.  If your symptoms do not get better within 1 week or if they get worse, check with  your doctor.  Dulcolax (bisacodyl) - Pick up over-the-counter and take as directed by the product packaging as needed to assist with the movement of your bowels.  Take with a full glass of water.  Use this product as needed if not relieved by Colace only.   MiraLax (polyethylene glycol) - Pick up over-the-counter to have on hand.  MiraLax is a solution that will increase the amount of water in your bowels to assist with bowel movements.  Take as directed and can mix  with a glass of water, juice, soda, coffee, or tea.  Take if you go more than two days without a movement. Do not use MiraLax more than once per day. Call your doctor if you are still constipated or irregular after using this medication for 7 days in a row.  If you continue to have problems with postoperative constipation, please contact the office for further assistance and recommendations.  If you experience "the worst abdominal pain ever" or develop nausea or vomiting, please contact the office immediatly for further recommendations for treatment.   Take a 325 mg Aspirin twice a day for four weeks at home, then can discontinue the Aspirin 325 mg.   Do not put a pillow under the knee. Place it under the heel.    Complete by:  As directed    Do not sit on low chairs, stoools or toilet seats, as it may be difficult to get up from low surfaces    Complete by:  As directed    Driving restrictions    Complete by:  As directed    No driving until released by the physician.   Increase activity slowly as tolerated    Complete by:  As directed    Lifting restrictions    Complete by:  As directed    No lifting until released by the physician.   Patient may shower    Complete by:  As directed    You may shower without a dressing once there is no drainage.  Do not wash over the wound.  If drainage remains, do not shower until drainage stops.   TED hose    Complete by:  As directed    Use stockings (TED hose) for 3 weeks on both leg(s).   You may remove them at night for sleeping.   Weight bearing as tolerated    Complete by:  As directed    Laterality:  left   Extremity:  Lower     Allergies as of 04/17/2016   No Known Allergies     Medication List    STOP taking these medications   meloxicam 15 MG tablet Commonly known as:  MOBIC     TAKE these medications   ALPRAZolam 0.5 MG tablet Commonly known as:  XANAX Take 0.5 mg by mouth daily as needed for anxiety.   aspirin 325 MG EC tablet Take 1 tablet (325 mg total) by mouth 2 (two) times daily. Take twice a day for four weeks, then may discontinue the Aspirin 325 mg.   citalopram 20 MG tablet Commonly known as:  CELEXA Take 20 mg by mouth daily.   CYANOCOBALAMIN PO Take 1 tablet by mouth daily.   hydrochlorothiazide 25 MG tablet Commonly known as:  HYDRODIURIL Take 25 mg by mouth daily.   methocarbamol 500 MG tablet Commonly known as:  ROBAXIN Take 1 tablet (500 mg total) by mouth every 6 (six) hours as needed for muscle spasms.   oxyCODONE 5 MG immediate release tablet Commonly known as:  Oxy IR/ROXICODONE Take 1-2 tablets (5-10 mg total) by mouth every 4 (four) hours as needed for moderate pain or severe pain.   Vitamin D (Ergocalciferol) 50000 units Caps capsule Commonly known as:  DRISDOL Take 50,000 Units by mouth once a week. Fridays.      Follow-up Information    [name], MD. Schedule an appointment as soon as possible for a visit on 04/29/2016.   Specialty:  Orthopedic Surgery Contact information: [address] [place]  [address] [place] [address] [phone]           Signed: [name], PA-C Orthopaedic Surgery 04/17/2016, 8:52 AM

## 2016-04-17 NOTE — Op Note (Signed)
NAMELEYLI, KEVORKIAN NO.:  [id number]  MEDICAL RECORD NO.:  [id number]  LOCATION:                                 FACILITY:  PHYSICIAN:  [name], M.D.         DATE OF BIRTH:  DATE OF PROCEDURE:  04/16/2016 DATE OF DISCHARGE:                              OPERATIVE REPORT   PREOPERATIVE DIAGNOSIS:  Unstable left total knee arthroplasty.  POSTOPERATIVE DIAGNOSIS:  Unstable left total knee arthroplasty.  PROCEDURE:  Left knee polyethylene revision.  SURGEON:  [name], M.D.  ASSISTANT:  Alexzandrew L. Perkins, PA-C.  ANESTHESIA:  Adductor canal block and general.  ESTIMATED BLOOD LOSS:  Minimal.  DRAINS:  Hemovac x1.  TOURNIQUET TIME:  18 minutes at 300 mmHg.  COMPLICATIONS:  None.  CONDITION:  Stable to Recovery.  BRIEF CLINICAL NOTE:  Katrina Huffman is a 61 year old female, who had a left total knee arthroplasty done in [place] several years ago.  She presented to me about 2 years ago with a patellar clunk syndrome.  This was treated with arthroscopic synovectomy.  The painful popping was eliminated, but she had persistent instability.  She is now having pain related to the instability.  She presents now for polyethylene versus total knee arthroplasty revision.  PROCEDURE IN DETAIL:  After successful administration of adductor canal block and general anesthetic, a tourniquet was placed high on her left thigh and left lower extremity was prepped and draped in the usual sterile fashion.  Extremities wrapped in Esmarch, and tourniquet inflated to 300 mmHg.  A midline incision was made with a 10 blade through subcutaneous tissue to the extensor mechanism.  A fresh blade was used to make a medial parapatellar arthrotomy.  Soft tissue on the proximal medial tibia subperiosteally elevated to the joint line with a knife and into the semimembranosus bursa with a Cobb elevator.  Soft tissue laterally was elevated with attention being paid to avoid  the patellar tendon on the tibial tubercle.  I was able to sublux the patella and then flex the knee to 90 degrees.  I was able to dislocate the tibia anteriorly from the femur and removed the tibial polyethylene. It was a size 4 posterior stabilized rotating platform, DePuy insert which was 12.5 mm thick.  I inspected the femoral and tibial components. Both were in excellent position.  Both were well fixed.  Thus, we did not need to do a component revision.  We trialed up to 17.5 mm thickness and with that, full extension was achieved with a tiny bit of play in extension and tiny bit of play in flexion.  I wanted to trial 20 mm insert which still allowed full extension with excellent varus-valgus and anterior-posterior balance throughout full range of motion.  I was very pleased with the stability with a 20-mm thickness insert.  We then removed the trial insert and placed a 20-mm rotating platform insert for a size 4 tibia and femur.  We reduced the knee and had the same stability parameters.  The wound was copiously irrigated with saline solution and then, we closed the arthrotomy over Hemovac drain with running #1 Stratafix suture.  Tourniquet  was released, total time of 18 minutes.  Subcu was closed with interrupted 2-0 Vicryl, and the skin with staples.  Note that prior to closing the arthrotomy, I injected the extensor mechanism, periosteum of the femur, and subcu tissues with a total of 20 mL of Exparel mixed with 30 mL of saline.  Once the skin was closed, then the drains hooked to suction.  Incision cleaned and dried, and a bulky sterile dressing applied.  She was placed into a knee immobilizer, awakened, and transported to Recovery in stable condition.  Note that a surgical assistant was a medical necessity for this procedure to do it in a safe and expeditious manner.  Assistant was necessary for retraction of vital ligaments and neurovascular structures as well as for proper  positioning of the limb for removal of the old implant and placement of the new implant accurately and safely.     [name], M.D.     FA/MEDQ  D:  04/16/2016  T:  04/16/2016  Job:  [id number]

## 2016-04-17 NOTE — Evaluation (Signed)
Occupational Therapy Evaluation Patient Details Name: Katrina Huffman MRN: [id number] DOB: 1955/05/13 Today's Date: 04/17/2016    History of Present Illness s/p L knee polyexchange   Clinical Impression   This 61 year old female was admitted for the above sx. All education was completed. No further OT is needed at this time    Follow Up Recommendations  Supervision/Assistance - 24 hour (vs)    Equipment Recommendations  None recommended by OT    Recommendations for Other Services       Precautions / Restrictions Precautions Precautions: Knee;Fall      Mobility Bed Mobility               General bed mobility comments: oob  Transfers Overall transfer level: Needs assistance Equipment used: Rolling walker (2 wheeled) Transfers: Sit to/from Stand Sit to Stand: Min assist         General transfer comment: assist to rise and steady; assist to control descent onto high commode.  Cues for UE/LE placement    Balance                                            ADL   Eating/Feeding: Independent   Grooming: Oral care;Supervision/safety;Standing   Upper Body Bathing: Set up;Sitting   Lower Body Bathing: Minimal assistance;Sit to/from stand   Upper Body Dressing : Set up;Sitting   Lower Body Dressing: Moderate assistance;Sit to/from stand   Toilet Transfer: BSC;RW;Ambulation   Toileting- Water quality scientist and Hygiene: Minimal assistance;Sit to/from stand         General ADL Comments: Educated on tub seat transfer options: she feels it is a stable seat that she could sit back onto and swing legs in.  Educated on readiness to step over if this is unsafe. Reviewed knee precautions     Vision         Perception     Praxis      Pertinent Vitals/Pain Pain Assessment: 0-10 Pain Score: 2  Pain Location: L knee  Pain Descriptors / Indicators: Tightness     Hand Dominance     Extremity/Trunk Assessment Upper Extremity  Assessment Upper Extremity Assessment: Overall WFL for tasks assessed           Communication Communication Communication: No difficulties   Cognition Arousal/Alertness: Awake/alert Behavior During Therapy: WFL for tasks assessed/performed Overall Cognitive Status: Within Functional Limits for tasks assessed                     General Comments       Exercises       Shoulder Instructions      Home Living Family/patient expects to be discharged to:: Private residence Living Arrangements: Alone; will have 24/7 assistance                    Bathroom Toilet: Handicapped height     Home Equipment: Shower seat   Additional Comments: has sink next to high commode      Prior Functioning/Environment Level of Independence: Independent                 OT Problem List:        OT Treatment/Interventions:      OT Goals(Current goals can be found in the care plan section) Acute Rehab OT Goals Patient Stated Goal: return to independence OT Goal Formulation: All  assessment and education complete, DC therapy  OT Frequency:     Barriers to D/C:            Co-evaluation              End of Session    Activity Tolerance: Patient limited by fatigue Patient left: in chair;with call bell/phone within reach  OT Visit Diagnosis: Pain Pain - Right/Left: Left Pain - part of body: Knee                ADL either performed or assessed with clinical judgement  Time: 1003-1027 OT Time Calculation (min): 24 min Charges:  OT General Charges $OT Visit: 1 Procedure OT Evaluation $OT Eval Low Complexity: 1 Procedure G-Codes:     [place], OTR/L [phone] 04/17/2016  Shacola Schussler 04/17/2016, 11:24 AM

## 2016-04-17 NOTE — Progress Notes (Signed)
   04/17/16 1300  PT Time Calculation  PT Start Time (ACUTE ONLY) 0922  PT Stop Time (ACUTE ONLY) 0958  PT Time Calculation (min) (ACUTE ONLY) 36 min  PT G-Codes **NOT FOR INPATIENT CLASS**  Functional Assessment Tool Used Clinical judgement  Functional Limitation Mobility: Walking and moving around  Mobility: Walking and Moving Around Current Status (N1833) CJ  Mobility: Walking and Moving Around Goal Status (P8251) CI  PT General Charges  $$ ACUTE PT VISIT 1 Procedure  PT Evaluation  $PT Eval Low Complexity 1 Procedure  PT Treatments  $Therapeutic Exercise 8-22 mins

## 2016-04-17 NOTE — Anesthesia Postprocedure Evaluation (Addendum)
Anesthesia Post Note  Patient: CHISOM MUNTEAN  Procedure(s) Performed: Procedure(s) (LRB): LEFT TOTAL KNEE ARTHROPLASTY WITH POLY REVISION (Left)  Patient location during evaluation: PACU Anesthesia Type: Spinal Level of consciousness: awake and alert Pain management: pain level controlled Vital Signs Assessment: post-procedure vital signs reviewed and stable Respiratory status: spontaneous breathing, nonlabored ventilation, respiratory function stable and patient connected to nasal cannula oxygen Cardiovascular status: blood pressure returned to baseline and stable Postop Assessment: no signs of nausea or vomiting Anesthetic complications: no       Last Vitals:  Vitals:   04/17/16 1000 04/17/16 1400  BP: (!) 132/51 116/62  Pulse: 72 62  Resp: 16 16  Temp: 36.7 C 36.8 C    Last Pain:  Vitals:   04/17/16 1626  TempSrc:   PainSc: 5                  Michaelene Dutan S

## 2016-04-17 NOTE — Progress Notes (Signed)
   Subjective: 1 Day Post-Op Procedure(s) (LRB): LEFT TOTAL KNEE ARTHROPLASTY WITH POLY REVISION (Left) Patient reports pain as mild.   Patient seen in rounds by Dr. Wynelle Link. Patient is well, but has had some minor complaints of pain in the knee, requiring pain medications  Will start therapy today.  She is doing well today and may possibly be ready to go home later today after two sessions.  Will setup for discharge later today.    Objective: Vital signs in last 24 hours: Temp:  [97.5 F (36.4 C)-99.1 F (37.3 C)] 97.5 F (36.4 C) (03/15 0537) Pulse Rate:  [64-95] 71 (03/15 0537) Resp:  [11-30] 15 (03/15 0537) BP: (101-151)/(49-90) 113/52 (03/15 0537) SpO2:  [93 %-100 %] 93 % (03/15 0537) Weight:  [125.6 kg (277 lb)] 125.6 kg (277 lb) (03/14 1723)  Intake/Output from previous day:  Intake/Output Summary (Last 24 hours) at 04/17/16 0819 Last data filed at 04/17/16 0742  Gross per 24 hour  Intake          3096.25 ml  Output             1965 ml  Net          1131.25 ml    Intake/Output this shift: Total I/O In: -  Out: 300 [Urine:300]  Labs:  Recent Labs  04/17/16 0447  HGB 11.5*    Recent Labs  04/17/16 0447  WBC 10.0  RBC 4.14  HCT 35.0*  PLT 235    Recent Labs  04/17/16 0447  NA 141  K 4.0  CL 107  CO2 27  BUN 16  CREATININE 0.61  GLUCOSE 181*  CALCIUM 8.8*   No results for input(s): LABPT, INR in the last 72 hours.  EXAM: General - Patient is Alert, Appropriate and Oriented Extremity - Neurovascular intact Sensation intact distally Intact pulses distally Dorsiflexion/Plantar flexion intact Dressing - clean, dry Motor Function - intact, moving foot and toes well on exam. Hemovac pulled.  Assessment/Plan: 1 Day Post-Op Procedure(s) (LRB): LEFT TOTAL KNEE ARTHROPLASTY WITH POLY REVISION (Left) Procedure(s) (LRB): LEFT TOTAL KNEE ARTHROPLASTY WITH POLY REVISION (Left) Past Medical History:  Diagnosis Date  . Arthritis    hands, knees,  back  . Depression   . Edema    left leg knee to foot, since veein stripping  . History of chicken pox   . Hypertension   . PONV (postoperative nausea and vomiting)    PONV after knee arthroplasty  . Scoliosis    sees chiropracter monthly  . Shortness of breath dyspnea    "out of shape"  . Sleep apnea    cpap   Principal Problem:   Failed total knee arthroplasty, sequela Active Problems:   Failed total knee arthroplasty ([place])  Estimated body mass index is 46.1 kg/m as calculated from the following:   Height as of this encounter: 5\' 5"  (1.651 m).   Weight as of this encounter: 125.6 kg (277 lb). Up with therapy Discharge home with home health after meets goals Diet - Cardiac diet Follow up - in 2 weeks Activity - WBAT Disposition - Home Condition Upon Discharge - pending therapy D/C Meds - See DC Summary DVT Prophylaxis - Aspirin 325 mg BID  [name], PA-C Orthopaedic Surgery 04/17/2016, 8:19 AM

## 2016-04-17 NOTE — Care Management Note (Signed)
Case Management Note  Patient Details  Name: Katrina Huffman MRN: [id number] Date of Birth: 07/03/1955  Subjective/Objective:                  Failed total knee Action/Plan: Discharge planning Expected Discharge Date:  04/17/16               Expected Discharge Plan:  [place]  In-House Referral:     Discharge planning Services  CM Consult  Post Acute Care Choice:  Home Health Choice offered to:  Patient  DME Arranged:  N/A DME Agency:  [facility] [employer])  [place]:    [place]:  [facility] [employer]  Status of Service:  Completed, signed off  If discussed at [employer], dates discussed:    Additional Comments: CM met with pt in room to confirm choice of home health agency is [facility]; pt confirms. Referral called to [facility] rep, Tim. No other CM needs were communicated. Dellie Catholic, RN 04/17/2016, 4:05 PM

## 2016-04-17 NOTE — Progress Notes (Signed)
Physical Therapy Treatment Patient Details Name: Katrina Huffman MRN: [id number] DOB: [birth year] Today's Date: 04/17/2016    History of Present Illness s/p L knee polyexchange    PT Comments    Pt progressing well with mobility and eager for dc home.  Family present and reviewed therex and stair into home.   Follow Up Recommendations  Home health PT     Equipment Recommendations  None recommended by PT    Recommendations for Other Services OT consult     Precautions / Restrictions Precautions Precautions: Knee;Fall Required Braces or Orthoses: Knee Immobilizer - Left Knee Immobilizer - Left: Discontinue once straight leg raise with < 10 degree lag Restrictions Weight Bearing Restrictions: No Other Position/Activity Restrictions: WBAT    Mobility  Bed Mobility Overal bed mobility: Needs Assistance Bed Mobility: Supine to Sit;Sit to Supine     Supine to sit: Min guard Sit to supine: Min guard   General bed mobility comments: cues for sequence and use of R LE to self assist  Transfers Overall transfer level: Needs assistance Equipment used: Rolling walker (2 wheeled) Transfers: Sit to/from Stand Sit to Stand: Min guard;Supervision         General transfer comment: cues for LE management and use of UEs to self assist  Ambulation/Gait Ambulation/Gait assistance: Min guard;Supervision Ambulation Distance (Feet): 80 Feet Assistive device: Rolling walker (2 wheeled) Gait Pattern/deviations: Step-to pattern;Step-through pattern;Decreased step length - right;Decreased step length - left;Shuffle;Trunk flexed Gait velocity: decr Gait velocity interpretation: Below normal speed for age/gender General Gait Details: cues for sequence, posture and position from RW   Stairs Stairs: Yes   Stair Management: No rails;Step to pattern;Backwards;With walker Number of Stairs: 2 General stair comments: single step twice with RW bkwd and cues for sequence and foot/RW  placement.  Sister and brother Water quality scientist [place] (Stroke Patients Only)       Balance                                    Cognition Arousal/Alertness: Awake/alert Behavior During Therapy: WFL for tasks assessed/performed Overall Cognitive Status: Within Functional Limits for tasks assessed                      Exercises Total Joint Exercises Ankle Circles/Pumps: AROM;Both;15 reps;Supine Quad Sets: AROM;Both;10 reps;Supine Heel Slides: AAROM;Left;15 reps;Supine Straight Leg Raises: AAROM;AROM;Left;15 reps;Supine    General Comments        Pertinent Vitals/Pain Pain Assessment: 0-10 Pain Score: 4  Pain Location: L knee  Pain Descriptors / Indicators: Tightness;Aching Pain Intervention(s): Limited activity within patient's tolerance;Monitored during session;Premedicated before session;Ice applied    Home Living Family/patient expects to be discharged to:: Private residence Living Arrangements: Alone Available Help at Discharge: Family Type of Home: House Home Access: Stairs to enter   Home Layout: One level Home Equipment: Clinical cytogeneticist - 2 wheels Additional Comments: has sink next to high commode    Prior Function Level of Independence: Independent          PT Goals (current goals can now be found in the care plan section) Acute Rehab PT Goals Patient Stated Goal: return to independence PT Goal Formulation: With patient Time For Goal Achievement: 04/18/16 Potential to Achieve Goals: Good Progress towards PT goals: Progressing toward goals    Frequency    7X/week      PT Plan Current plan  remains appropriate    Co-evaluation             End of Session   Activity Tolerance: Patient tolerated treatment well Patient left: in bed;with call bell/phone within reach;with family/visitor present Nurse Communication: Mobility status PT Visit Diagnosis: Difficulty in walking, not elsewhere  classified (R26.2)     Time: 2956-2130 PT Time Calculation (min) (ACUTE ONLY): 30 min  Charges:  $Gait Training: 8-22 mins $Therapeutic Exercise: 8-22 mins                    G Codes:       Portia Wisdom Apr 25, 2016, 3:27 PM

## 2016-04-18 DIAGNOSIS — T84093D Other mechanical complication of internal left knee prosthesis, subsequent encounter: Secondary | ICD-10-CM | POA: Diagnosis not present

## 2016-04-18 DIAGNOSIS — Z7982 Long term (current) use of aspirin: Secondary | ICD-10-CM | POA: Diagnosis not present

## 2016-04-18 DIAGNOSIS — F329 Major depressive disorder, single episode, unspecified: Secondary | ICD-10-CM | POA: Diagnosis not present

## 2016-04-18 DIAGNOSIS — Z79891 Long term (current) use of opiate analgesic: Secondary | ICD-10-CM | POA: Diagnosis not present

## 2016-04-18 DIAGNOSIS — M419 Scoliosis, unspecified: Secondary | ICD-10-CM | POA: Diagnosis not present

## 2016-04-18 DIAGNOSIS — F419 Anxiety disorder, unspecified: Secondary | ICD-10-CM | POA: Diagnosis not present

## 2016-04-18 DIAGNOSIS — M1711 Unilateral primary osteoarthritis, right knee: Secondary | ICD-10-CM | POA: Diagnosis not present

## 2016-04-18 DIAGNOSIS — Z6841 Body Mass Index (BMI) 40.0 and over, adult: Secondary | ICD-10-CM | POA: Diagnosis not present

## 2016-04-18 DIAGNOSIS — I10 Essential (primary) hypertension: Secondary | ICD-10-CM | POA: Diagnosis not present

## 2016-04-21 DIAGNOSIS — T84093D Other mechanical complication of internal left knee prosthesis, subsequent encounter: Secondary | ICD-10-CM | POA: Diagnosis not present

## 2016-04-21 DIAGNOSIS — Z6841 Body Mass Index (BMI) 40.0 and over, adult: Secondary | ICD-10-CM | POA: Diagnosis not present

## 2016-04-21 DIAGNOSIS — I10 Essential (primary) hypertension: Secondary | ICD-10-CM | POA: Diagnosis not present

## 2016-04-21 DIAGNOSIS — F329 Major depressive disorder, single episode, unspecified: Secondary | ICD-10-CM | POA: Diagnosis not present

## 2016-04-21 DIAGNOSIS — Z7982 Long term (current) use of aspirin: Secondary | ICD-10-CM | POA: Diagnosis not present

## 2016-04-21 DIAGNOSIS — M419 Scoliosis, unspecified: Secondary | ICD-10-CM | POA: Diagnosis not present

## 2016-04-21 DIAGNOSIS — Z79891 Long term (current) use of opiate analgesic: Secondary | ICD-10-CM | POA: Diagnosis not present

## 2016-04-21 DIAGNOSIS — F419 Anxiety disorder, unspecified: Secondary | ICD-10-CM | POA: Diagnosis not present

## 2016-04-21 DIAGNOSIS — M1711 Unilateral primary osteoarthritis, right knee: Secondary | ICD-10-CM | POA: Diagnosis not present

## 2016-04-22 NOTE — Progress Notes (Signed)
   04/17/16 1116  OT Time Calculation  OT Start Time (ACUTE ONLY) 1003  OT Stop Time (ACUTE ONLY) 1027  OT Time Calculation (min) 24 min  OT G-codes **NOT FOR INPATIENT CLASS**  Functional Assessment Tool Used Clinical judgement;AM-PAC 6 Clicks Daily Activity  Functional Limitation Self care  Self Care Current Status (T5320) CK  Self Care Goal Status (E3343) CK  Self Care Discharge Status (H6861) CK  OT General Charges  $OT Visit 1 Procedure  OT Evaluation  $OT Eval Low Complexity 1 Procedure  late g-code Lesle Chris, OTR/L [phone] 04/22/2016

## 2016-04-23 DIAGNOSIS — Z7982 Long term (current) use of aspirin: Secondary | ICD-10-CM | POA: Diagnosis not present

## 2016-04-23 DIAGNOSIS — Z6841 Body Mass Index (BMI) 40.0 and over, adult: Secondary | ICD-10-CM | POA: Diagnosis not present

## 2016-04-23 DIAGNOSIS — F419 Anxiety disorder, unspecified: Secondary | ICD-10-CM | POA: Diagnosis not present

## 2016-04-23 DIAGNOSIS — T84093D Other mechanical complication of internal left knee prosthesis, subsequent encounter: Secondary | ICD-10-CM | POA: Diagnosis not present

## 2016-04-23 DIAGNOSIS — M419 Scoliosis, unspecified: Secondary | ICD-10-CM | POA: Diagnosis not present

## 2016-04-23 DIAGNOSIS — M1711 Unilateral primary osteoarthritis, right knee: Secondary | ICD-10-CM | POA: Diagnosis not present

## 2016-04-23 DIAGNOSIS — Z79891 Long term (current) use of opiate analgesic: Secondary | ICD-10-CM | POA: Diagnosis not present

## 2016-04-23 DIAGNOSIS — F329 Major depressive disorder, single episode, unspecified: Secondary | ICD-10-CM | POA: Diagnosis not present

## 2016-04-23 DIAGNOSIS — I10 Essential (primary) hypertension: Secondary | ICD-10-CM | POA: Diagnosis not present

## 2016-04-25 DIAGNOSIS — G4733 Obstructive sleep apnea (adult) (pediatric): Secondary | ICD-10-CM | POA: Diagnosis not present

## 2016-04-25 DIAGNOSIS — I10 Essential (primary) hypertension: Secondary | ICD-10-CM | POA: Diagnosis not present

## 2016-04-25 DIAGNOSIS — M419 Scoliosis, unspecified: Secondary | ICD-10-CM | POA: Diagnosis not present

## 2016-04-25 DIAGNOSIS — R7303 Prediabetes: Secondary | ICD-10-CM | POA: Diagnosis not present

## 2016-04-25 DIAGNOSIS — Z6841 Body Mass Index (BMI) 40.0 and over, adult: Secondary | ICD-10-CM | POA: Diagnosis not present

## 2016-04-25 DIAGNOSIS — M1711 Unilateral primary osteoarthritis, right knee: Secondary | ICD-10-CM | POA: Diagnosis not present

## 2016-04-25 DIAGNOSIS — Z79891 Long term (current) use of opiate analgesic: Secondary | ICD-10-CM | POA: Diagnosis not present

## 2016-04-25 DIAGNOSIS — F419 Anxiety disorder, unspecified: Secondary | ICD-10-CM | POA: Diagnosis not present

## 2016-04-25 DIAGNOSIS — T84093D Other mechanical complication of internal left knee prosthesis, subsequent encounter: Secondary | ICD-10-CM | POA: Diagnosis not present

## 2016-04-25 DIAGNOSIS — F329 Major depressive disorder, single episode, unspecified: Secondary | ICD-10-CM | POA: Diagnosis not present

## 2016-04-25 DIAGNOSIS — Z7982 Long term (current) use of aspirin: Secondary | ICD-10-CM | POA: Diagnosis not present

## 2016-04-28 DIAGNOSIS — I10 Essential (primary) hypertension: Secondary | ICD-10-CM | POA: Diagnosis not present

## 2016-04-28 DIAGNOSIS — F329 Major depressive disorder, single episode, unspecified: Secondary | ICD-10-CM | POA: Diagnosis not present

## 2016-04-28 DIAGNOSIS — T84093D Other mechanical complication of internal left knee prosthesis, subsequent encounter: Secondary | ICD-10-CM | POA: Diagnosis not present

## 2016-04-28 DIAGNOSIS — M1711 Unilateral primary osteoarthritis, right knee: Secondary | ICD-10-CM | POA: Diagnosis not present

## 2016-04-28 DIAGNOSIS — F419 Anxiety disorder, unspecified: Secondary | ICD-10-CM | POA: Diagnosis not present

## 2016-04-28 DIAGNOSIS — M419 Scoliosis, unspecified: Secondary | ICD-10-CM | POA: Diagnosis not present

## 2016-04-28 DIAGNOSIS — Z79891 Long term (current) use of opiate analgesic: Secondary | ICD-10-CM | POA: Diagnosis not present

## 2016-04-28 DIAGNOSIS — Z7982 Long term (current) use of aspirin: Secondary | ICD-10-CM | POA: Diagnosis not present

## 2016-04-28 DIAGNOSIS — Z6841 Body Mass Index (BMI) 40.0 and over, adult: Secondary | ICD-10-CM | POA: Diagnosis not present

## 2016-04-30 DIAGNOSIS — Z79891 Long term (current) use of opiate analgesic: Secondary | ICD-10-CM | POA: Diagnosis not present

## 2016-04-30 DIAGNOSIS — T84093D Other mechanical complication of internal left knee prosthesis, subsequent encounter: Secondary | ICD-10-CM | POA: Diagnosis not present

## 2016-04-30 DIAGNOSIS — I10 Essential (primary) hypertension: Secondary | ICD-10-CM | POA: Diagnosis not present

## 2016-04-30 DIAGNOSIS — G4733 Obstructive sleep apnea (adult) (pediatric): Secondary | ICD-10-CM | POA: Diagnosis not present

## 2016-04-30 DIAGNOSIS — M419 Scoliosis, unspecified: Secondary | ICD-10-CM | POA: Diagnosis not present

## 2016-04-30 DIAGNOSIS — F419 Anxiety disorder, unspecified: Secondary | ICD-10-CM | POA: Diagnosis not present

## 2016-04-30 DIAGNOSIS — Z7982 Long term (current) use of aspirin: Secondary | ICD-10-CM | POA: Diagnosis not present

## 2016-04-30 DIAGNOSIS — Z6841 Body Mass Index (BMI) 40.0 and over, adult: Secondary | ICD-10-CM | POA: Diagnosis not present

## 2016-04-30 DIAGNOSIS — M1711 Unilateral primary osteoarthritis, right knee: Secondary | ICD-10-CM | POA: Diagnosis not present

## 2016-04-30 DIAGNOSIS — F329 Major depressive disorder, single episode, unspecified: Secondary | ICD-10-CM | POA: Diagnosis not present

## 2016-05-01 DIAGNOSIS — Z79891 Long term (current) use of opiate analgesic: Secondary | ICD-10-CM | POA: Diagnosis not present

## 2016-05-01 DIAGNOSIS — Z7982 Long term (current) use of aspirin: Secondary | ICD-10-CM | POA: Diagnosis not present

## 2016-05-01 DIAGNOSIS — Z6841 Body Mass Index (BMI) 40.0 and over, adult: Secondary | ICD-10-CM | POA: Diagnosis not present

## 2016-05-01 DIAGNOSIS — I10 Essential (primary) hypertension: Secondary | ICD-10-CM | POA: Diagnosis not present

## 2016-05-01 DIAGNOSIS — F329 Major depressive disorder, single episode, unspecified: Secondary | ICD-10-CM | POA: Diagnosis not present

## 2016-05-01 DIAGNOSIS — M419 Scoliosis, unspecified: Secondary | ICD-10-CM | POA: Diagnosis not present

## 2016-05-01 DIAGNOSIS — F419 Anxiety disorder, unspecified: Secondary | ICD-10-CM | POA: Diagnosis not present

## 2016-05-01 DIAGNOSIS — T84093D Other mechanical complication of internal left knee prosthesis, subsequent encounter: Secondary | ICD-10-CM | POA: Diagnosis not present

## 2016-05-01 DIAGNOSIS — M1711 Unilateral primary osteoarthritis, right knee: Secondary | ICD-10-CM | POA: Diagnosis not present

## 2016-05-20 DIAGNOSIS — Z96652 Presence of left artificial knee joint: Secondary | ICD-10-CM | POA: Diagnosis not present

## 2016-05-20 DIAGNOSIS — Z471 Aftercare following joint replacement surgery: Secondary | ICD-10-CM | POA: Diagnosis not present

## 2016-05-30 DIAGNOSIS — Z713 Dietary counseling and surveillance: Secondary | ICD-10-CM | POA: Diagnosis not present

## 2016-05-30 DIAGNOSIS — I10 Essential (primary) hypertension: Secondary | ICD-10-CM | POA: Diagnosis not present

## 2016-05-30 DIAGNOSIS — Z6841 Body Mass Index (BMI) 40.0 and over, adult: Secondary | ICD-10-CM | POA: Diagnosis not present

## 2016-05-31 DIAGNOSIS — G4733 Obstructive sleep apnea (adult) (pediatric): Secondary | ICD-10-CM | POA: Diagnosis not present

## 2016-06-27 DIAGNOSIS — E669 Obesity, unspecified: Secondary | ICD-10-CM | POA: Diagnosis not present

## 2016-06-27 DIAGNOSIS — Z6841 Body Mass Index (BMI) 40.0 and over, adult: Secondary | ICD-10-CM | POA: Diagnosis not present

## 2016-06-30 DIAGNOSIS — G4733 Obstructive sleep apnea (adult) (pediatric): Secondary | ICD-10-CM | POA: Diagnosis not present

## 2016-07-07 NOTE — Addendum Note (Signed)
Addendum  created 07/07/16 1222 by Myrtie Soman, MD   Sign clinical note

## 2016-07-31 DIAGNOSIS — G4733 Obstructive sleep apnea (adult) (pediatric): Secondary | ICD-10-CM | POA: Diagnosis not present

## 2016-08-01 DIAGNOSIS — E559 Vitamin D deficiency, unspecified: Secondary | ICD-10-CM | POA: Diagnosis not present

## 2016-08-01 DIAGNOSIS — Z6841 Body Mass Index (BMI) 40.0 and over, adult: Secondary | ICD-10-CM | POA: Diagnosis not present

## 2016-08-01 DIAGNOSIS — E6609 Other obesity due to excess calories: Secondary | ICD-10-CM | POA: Diagnosis not present

## 2016-08-01 DIAGNOSIS — E538 Deficiency of other specified B group vitamins: Secondary | ICD-10-CM | POA: Diagnosis not present

## 2016-08-08 DIAGNOSIS — Z6841 Body Mass Index (BMI) 40.0 and over, adult: Secondary | ICD-10-CM | POA: Diagnosis not present

## 2016-08-08 DIAGNOSIS — G4733 Obstructive sleep apnea (adult) (pediatric): Secondary | ICD-10-CM | POA: Diagnosis not present

## 2016-08-08 DIAGNOSIS — I10 Essential (primary) hypertension: Secondary | ICD-10-CM | POA: Diagnosis not present

## 2016-08-11 DIAGNOSIS — R601 Generalized edema: Secondary | ICD-10-CM | POA: Diagnosis not present

## 2016-08-11 DIAGNOSIS — Z124 Encounter for screening for malignant neoplasm of cervix: Secondary | ICD-10-CM | POA: Diagnosis not present

## 2016-08-11 DIAGNOSIS — Z01419 Encounter for gynecological examination (general) (routine) without abnormal findings: Secondary | ICD-10-CM | POA: Diagnosis not present

## 2016-08-19 DIAGNOSIS — Z131 Encounter for screening for diabetes mellitus: Secondary | ICD-10-CM | POA: Diagnosis not present

## 2016-08-19 DIAGNOSIS — Z1329 Encounter for screening for other suspected endocrine disorder: Secondary | ICD-10-CM | POA: Diagnosis not present

## 2016-08-19 DIAGNOSIS — Z136 Encounter for screening for cardiovascular disorders: Secondary | ICD-10-CM | POA: Diagnosis not present

## 2016-08-19 DIAGNOSIS — Z13220 Encounter for screening for lipoid disorders: Secondary | ICD-10-CM | POA: Diagnosis not present

## 2016-08-20 DIAGNOSIS — F54 Psychological and behavioral factors associated with disorders or diseases classified elsewhere: Secondary | ICD-10-CM | POA: Diagnosis not present

## 2016-08-20 DIAGNOSIS — R7303 Prediabetes: Secondary | ICD-10-CM | POA: Diagnosis not present

## 2016-08-20 DIAGNOSIS — E538 Deficiency of other specified B group vitamins: Secondary | ICD-10-CM | POA: Diagnosis not present

## 2016-08-20 DIAGNOSIS — Z6841 Body Mass Index (BMI) 40.0 and over, adult: Secondary | ICD-10-CM | POA: Diagnosis not present

## 2016-08-20 DIAGNOSIS — E559 Vitamin D deficiency, unspecified: Secondary | ICD-10-CM | POA: Diagnosis not present

## 2016-08-26 DIAGNOSIS — Z1231 Encounter for screening mammogram for malignant neoplasm of breast: Secondary | ICD-10-CM | POA: Diagnosis not present

## 2016-08-30 DIAGNOSIS — G4733 Obstructive sleep apnea (adult) (pediatric): Secondary | ICD-10-CM | POA: Diagnosis not present

## 2016-09-09 DIAGNOSIS — R946 Abnormal results of thyroid function studies: Secondary | ICD-10-CM | POA: Diagnosis not present

## 2016-09-12 DIAGNOSIS — M19011 Primary osteoarthritis, right shoulder: Secondary | ICD-10-CM | POA: Diagnosis not present

## 2016-09-15 ENCOUNTER — Other Ambulatory Visit: Payer: Self-pay | Admitting: Orthopedic Surgery

## 2016-09-15 DIAGNOSIS — M19011 Primary osteoarthritis, right shoulder: Secondary | ICD-10-CM

## 2016-09-19 DIAGNOSIS — E538 Deficiency of other specified B group vitamins: Secondary | ICD-10-CM | POA: Diagnosis not present

## 2016-09-19 DIAGNOSIS — R7303 Prediabetes: Secondary | ICD-10-CM | POA: Diagnosis not present

## 2016-09-19 DIAGNOSIS — Z7189 Other specified counseling: Secondary | ICD-10-CM | POA: Diagnosis not present

## 2016-09-19 DIAGNOSIS — G4733 Obstructive sleep apnea (adult) (pediatric): Secondary | ICD-10-CM | POA: Diagnosis not present

## 2016-09-22 ENCOUNTER — Other Ambulatory Visit: Payer: BLUE CROSS/BLUE SHIELD

## 2016-09-22 ENCOUNTER — Ambulatory Visit
Admission: RE | Admit: 2016-09-22 | Discharge: 2016-09-22 | Disposition: A | Discharge status: Home / Self Care | Payer: BLUE CROSS/BLUE SHIELD | Source: Ambulatory Visit | Attending: Orthopedic Surgery | Admitting: Orthopedic Surgery

## 2016-09-22 DIAGNOSIS — M19011 Primary osteoarthritis, right shoulder: Secondary | ICD-10-CM

## 2016-09-30 DIAGNOSIS — G4733 Obstructive sleep apnea (adult) (pediatric): Secondary | ICD-10-CM | POA: Diagnosis not present

## 2016-10-01 DIAGNOSIS — Q399 Congenital malformation of esophagus, unspecified: Secondary | ICD-10-CM | POA: Diagnosis not present

## 2016-10-01 DIAGNOSIS — K209 Esophagitis, unspecified: Secondary | ICD-10-CM | POA: Diagnosis not present

## 2016-10-01 DIAGNOSIS — K449 Diaphragmatic hernia without obstruction or gangrene: Secondary | ICD-10-CM | POA: Diagnosis not present

## 2016-10-01 DIAGNOSIS — K228 Other specified diseases of esophagus: Secondary | ICD-10-CM | POA: Diagnosis not present

## 2016-10-02 ENCOUNTER — Encounter: Payer: Self-pay | Admitting: Family Medicine

## 2016-10-02 DIAGNOSIS — K3189 Other diseases of stomach and duodenum: Secondary | ICD-10-CM | POA: Diagnosis not present

## 2016-10-02 DIAGNOSIS — I10 Essential (primary) hypertension: Secondary | ICD-10-CM | POA: Diagnosis not present

## 2016-10-02 DIAGNOSIS — K209 Esophagitis, unspecified without bleeding: Secondary | ICD-10-CM | POA: Insufficient documentation

## 2016-10-02 DIAGNOSIS — Z01818 Encounter for other preprocedural examination: Secondary | ICD-10-CM | POA: Diagnosis not present

## 2016-10-02 DIAGNOSIS — K228 Other specified diseases of esophagus: Secondary | ICD-10-CM | POA: Diagnosis not present

## 2016-10-02 DIAGNOSIS — Z87891 Personal history of nicotine dependence: Secondary | ICD-10-CM | POA: Diagnosis not present

## 2016-10-02 DIAGNOSIS — K449 Diaphragmatic hernia without obstruction or gangrene: Secondary | ICD-10-CM | POA: Diagnosis not present

## 2016-10-02 DIAGNOSIS — G4733 Obstructive sleep apnea (adult) (pediatric): Secondary | ICD-10-CM | POA: Diagnosis not present

## 2016-10-02 DIAGNOSIS — Q399 Congenital malformation of esophagus, unspecified: Secondary | ICD-10-CM | POA: Diagnosis not present

## 2016-10-02 DIAGNOSIS — Z6841 Body Mass Index (BMI) 40.0 and over, adult: Secondary | ICD-10-CM | POA: Diagnosis not present

## 2016-10-02 DIAGNOSIS — K210 Gastro-esophageal reflux disease with esophagitis: Secondary | ICD-10-CM | POA: Diagnosis not present

## 2016-10-10 DIAGNOSIS — Z7189 Other specified counseling: Secondary | ICD-10-CM | POA: Diagnosis not present

## 2016-10-10 DIAGNOSIS — G4733 Obstructive sleep apnea (adult) (pediatric): Secondary | ICD-10-CM | POA: Diagnosis not present

## 2016-10-10 DIAGNOSIS — R7303 Prediabetes: Secondary | ICD-10-CM | POA: Diagnosis not present

## 2016-10-10 NOTE — Pre-Procedure Instructions (Signed)
Katrina Huffman  10/10/2016      [facility] [place], [place] - [address] [place] [address] Phone: [phone] Fax: [phone]  [place], [place] [address] [address] Phone: [phone] Fax: [phone]    Your procedure is scheduled on October 16, 2016.  Report to [facility] Admitting at 530 AM.  Call this number if you have problems the morning of surgery:  [phone]   Remember:  Do not eat food or drink liquids after midnight.  Take these medicines the morning of surgery with A SIP OF WATER alprazolam (xanax)-if needed, citalopram (celexa).  7 days prior to surgery STOP taking any meloxicam (mobic), Aspirin, Aleve, Naproxen, Ibuprofen, Motrin, Advil, Goody's, BC's, all herbal medications, fish oil, and all vitamins   Do not wear jewelry, make-up or nail polish.  Do not wear lotions, powders, or perfumes, or deoderant.  Do not shave 48 hours prior to surgery.    Do not bring valuables to the hospital.  [facility] is not responsible for any belongings or valuables.  Contacts, dentures or bridgework may not be worn into surgery.  Leave your suitcase in the car.  After surgery it may be brought to your room.  For patients admitted to the hospital, discharge time will be determined by your treatment team.  Patients discharged the day of surgery will not be allowed to drive home.   Special instructions:   [facility]- Preparing For Surgery  Before surgery, you can play an important role. Because skin is not sterile, your skin needs to be as free of germs as possible. You can reduce the number of germs on your skin by washing with CHG (chlorahexidine gluconate) Soap before surgery.  CHG is an antiseptic cleaner which kills germs and bonds with the skin to continue killing germs even after washing.  Please do not use if you have an allergy to CHG or  antibacterial soaps. If your skin becomes reddened/irritated stop using the CHG.  Do not shave (including legs and underarms) for at least 48 hours prior to first CHG shower. It is OK to shave your face.  Please follow these instructions carefully.   1. Shower the NIGHT BEFORE SURGERY and the MORNING OF SURGERY with CHG.   2. If you chose to wash your hair, wash your hair first as usual with your normal shampoo.  3. After you shampoo, rinse your hair and body thoroughly to remove the shampoo.  4. Use CHG as you would any other liquid soap. You can apply CHG directly to the skin and wash gently with a scrungie or a clean washcloth.   5. Apply the CHG Soap to your body ONLY FROM THE NECK DOWN.  Do not use on open wounds or open sores. Avoid contact with your eyes, ears, mouth and genitals (private parts). Wash genitals (private parts) with your normal soap.  6. Wash thoroughly, paying special attention to the area where your surgery will be performed.  7. Thoroughly rinse your body with warm water from the neck down.  8. DO NOT shower/wash with your normal soap after using and rinsing off the CHG Soap.  9. Pat yourself dry with a CLEAN TOWEL.   10. Wear CLEAN PAJAMAS   11. Place CLEAN SHEETS on your bed the night of your first shower and DO NOT SLEEP WITH PETS.    Day of Surgery:  Do not apply any deodorants/lotions. Please wear clean clothes to the hospital/surgery center.     Please read over the following fact sheets that you were given. Pain Booklet, Coughing and Deep Breathing, MRSA Information and Surgical Site Infection Prevention

## 2016-10-13 ENCOUNTER — Encounter (HOSPITAL_COMMUNITY)
Admission: RE | Admit: 2016-10-13 | Discharge: 2016-10-13 | Disposition: A | Discharge status: Home / Self Care | Payer: BLUE CROSS/BLUE SHIELD | Source: Ambulatory Visit | Attending: Orthopedic Surgery | Admitting: Orthopedic Surgery

## 2016-10-13 ENCOUNTER — Encounter (HOSPITAL_COMMUNITY): Payer: Self-pay

## 2016-10-13 DIAGNOSIS — M19011 Primary osteoarthritis, right shoulder: Secondary | ICD-10-CM | POA: Insufficient documentation

## 2016-10-13 DIAGNOSIS — Z01812 Encounter for preprocedural laboratory examination: Secondary | ICD-10-CM | POA: Diagnosis not present

## 2016-10-13 HISTORY — DX: Personal history of other diseases of the digestive system: Z87.19

## 2016-10-13 HISTORY — DX: Anxiety disorder, unspecified: F41.9

## 2016-10-13 LAB — CBC
HEMATOCRIT: 41.8 % (ref 36.0–46.0)
HEMOGLOBIN: 13.6 g/dL (ref 12.0–15.0)
MCH: 28 pg (ref 26.0–34.0)
MCHC: 32.5 g/dL (ref 30.0–36.0)
MCV: 86 fL (ref 78.0–100.0)
Platelets: 220 10*3/uL (ref 150–400)
RBC: 4.86 MIL/uL (ref 3.87–5.11)
RDW: 14 % (ref 11.5–15.5)
WBC: 7.7 10*3/uL (ref 4.0–10.5)

## 2016-10-13 LAB — BASIC METABOLIC PANEL
ANION GAP: 9 (ref 5–15)
BUN: 24 mg/dL — ABNORMAL HIGH (ref 6–20)
CHLORIDE: 104 mmol/L (ref 101–111)
CO2: 26 mmol/L (ref 22–32)
CREATININE: 0.68 mg/dL (ref 0.44–1.00)
Calcium: 8.7 mg/dL — ABNORMAL LOW (ref 8.9–10.3)
GFR calc non Af Amer: >60 mL/min (ref >60)
Glucose, Bld: 111 mg/dL — ABNORMAL HIGH (ref 65–99)
Potassium: 3.6 mmol/L (ref 3.5–5.1)
Sodium: 139 mmol/L (ref 135–145)

## 2016-10-13 LAB — SURGICAL PCR SCREEN
MRSA, PCR: NEGATIVE
STAPHYLOCOCCUS AUREUS: NEGATIVE

## 2016-10-15 ENCOUNTER — Encounter (HOSPITAL_COMMUNITY): Payer: Self-pay | Admitting: Anesthesiology

## 2016-10-15 DIAGNOSIS — I10 Essential (primary) hypertension: Secondary | ICD-10-CM | POA: Diagnosis not present

## 2016-10-15 DIAGNOSIS — G4733 Obstructive sleep apnea (adult) (pediatric): Secondary | ICD-10-CM | POA: Diagnosis not present

## 2016-10-15 MED ORDER — SODIUM CHLORIDE 0.9 % IV SOLN
1000.0000 mg | INTRAVENOUS | Status: AC
  Administered 2016-10-16: 1000 mg via INTRAVENOUS
  Filled 2016-10-15: qty 10

## 2016-10-15 MED ORDER — DEXTROSE 5 % IV SOLN
3.0000 g | INTRAVENOUS | Status: AC
  Administered 2016-10-16: 3 g via INTRAVENOUS
  Filled 2016-10-15 (×2): qty 3000

## 2016-10-15 NOTE — Anesthesia Preprocedure Evaluation (Addendum)
Anesthesia Evaluation  Patient identified by MRN, date of birth, ID band Patient awake    Reviewed: Allergy & Precautions, NPO status , Patient's Chart, lab work & pertinent test results  History of Anesthesia Complications (+) PONV and history of anesthetic complications  Airway Mallampati: II  TM Distance: >3 FB Neck ROM: Full    Dental  (+) Teeth Intact, Dental Advisory Given   Pulmonary sleep apnea ,    breath sounds clear to auscultation       Cardiovascular hypertension, Pt. on medications  Rhythm:Regular Rate:Normal     Neuro/Psych PSYCHIATRIC DISORDERS Anxiety Depression    GI/Hepatic Neg liver ROS, hiatal hernia,   Endo/Other  negative endocrine ROS  Renal/GU negative Renal ROS     Musculoskeletal  (+) Arthritis ,   Abdominal   Peds  Hematology negative hematology ROS (+)   Anesthesia Other Findings   Reproductive/Obstetrics                            Lab Results  Component Value Date   WBC 7.7 10/13/2016   HGB 13.6 10/13/2016   HCT 41.8 10/13/2016   MCV 86.0 10/13/2016   PLT 220 10/13/2016   Lab Results  Component Value Date   CREATININE 0.68 10/13/2016   BUN 24 (H) 10/13/2016   NA 139 10/13/2016   K 3.6 10/13/2016   CL 104 10/13/2016   CO2 26 10/13/2016     EKG: NSR  Anesthesia Physical Anesthesia Plan  ASA: III  Anesthesia Plan: General   Post-op Pain Management:  Regional for Post-op pain   Induction: Intravenous  PONV Risk Score and Plan: 4 or greater and Ondansetron, Dexamethasone, Midazolam, Scopolamine patch - Pre-op and Treatment may vary due to age or medical condition  Airway Management Planned: Oral ETT  Additional Equipment:   Intra-op Plan:   Post-operative Plan: Extubation in OR  Informed Consent: I have reviewed the patients History and Physical, chart, labs and discussed the procedure including the risks, benefits and alternatives  for the proposed anesthesia with the patient or authorized representative who has indicated his/her understanding and acceptance.   Dental advisory given  Plan Discussed with: CRNA  Anesthesia Plan Comments: (Possible Glidescope)      Anesthesia Quick Evaluation

## 2016-10-16 ENCOUNTER — Encounter (HOSPITAL_COMMUNITY): Payer: Self-pay | Admitting: *Deleted

## 2016-10-16 ENCOUNTER — Ambulatory Visit (HOSPITAL_COMMUNITY): Payer: BLUE CROSS/BLUE SHIELD | Admitting: Anesthesiology

## 2016-10-16 ENCOUNTER — Observation Stay (HOSPITAL_COMMUNITY)
Admission: RE | Admit: 2016-10-16 | Discharge: 2016-10-17 | Disposition: A | Discharge status: Home / Self Care | Payer: BLUE CROSS/BLUE SHIELD | Source: Ambulatory Visit | Attending: Orthopedic Surgery | Admitting: Orthopedic Surgery

## 2016-10-16 ENCOUNTER — Encounter (HOSPITAL_COMMUNITY)
Admission: RE | Disposition: A | Discharge status: Home / Self Care | Payer: Self-pay | Source: Ambulatory Visit | Attending: Orthopedic Surgery

## 2016-10-16 DIAGNOSIS — Z79899 Other long term (current) drug therapy: Secondary | ICD-10-CM | POA: Insufficient documentation

## 2016-10-16 DIAGNOSIS — M19011 Primary osteoarthritis, right shoulder: Principal | ICD-10-CM | POA: Insufficient documentation

## 2016-10-16 DIAGNOSIS — Z96652 Presence of left artificial knee joint: Secondary | ICD-10-CM | POA: Insufficient documentation

## 2016-10-16 DIAGNOSIS — G8918 Other acute postprocedural pain: Secondary | ICD-10-CM | POA: Diagnosis not present

## 2016-10-16 DIAGNOSIS — F419 Anxiety disorder, unspecified: Secondary | ICD-10-CM | POA: Diagnosis not present

## 2016-10-16 DIAGNOSIS — Z791 Long term (current) use of non-steroidal anti-inflammatories (NSAID): Secondary | ICD-10-CM | POA: Diagnosis not present

## 2016-10-16 DIAGNOSIS — F329 Major depressive disorder, single episode, unspecified: Secondary | ICD-10-CM | POA: Diagnosis not present

## 2016-10-16 DIAGNOSIS — Z96619 Presence of unspecified artificial shoulder joint: Secondary | ICD-10-CM

## 2016-10-16 DIAGNOSIS — Z9989 Dependence on other enabling machines and devices: Secondary | ICD-10-CM | POA: Insufficient documentation

## 2016-10-16 DIAGNOSIS — E559 Vitamin D deficiency, unspecified: Secondary | ICD-10-CM | POA: Diagnosis not present

## 2016-10-16 DIAGNOSIS — G4730 Sleep apnea, unspecified: Secondary | ICD-10-CM | POA: Insufficient documentation

## 2016-10-16 DIAGNOSIS — I10 Essential (primary) hypertension: Secondary | ICD-10-CM | POA: Diagnosis not present

## 2016-10-16 DIAGNOSIS — M25711 Osteophyte, right shoulder: Secondary | ICD-10-CM | POA: Diagnosis not present

## 2016-10-16 HISTORY — PX: TOTAL SHOULDER ARTHROPLASTY: SHX126

## 2016-10-16 SURGERY — ARTHROPLASTY, SHOULDER, TOTAL
Anesthesia: General | Laterality: Right

## 2016-10-16 MED ORDER — PHENOL 1.4 % MT LIQD: 1.0000 | OROMUCOSAL | Status: DC | PRN

## 2016-10-16 MED ORDER — HYDROMORPHONE HCL 1 MG/ML IJ SOLN
INTRAMUSCULAR | Status: DC | PRN
  Administered 2016-10-16: 0.5 mg via INTRAVENOUS

## 2016-10-16 MED ORDER — 0.9 % SODIUM CHLORIDE (POUR BTL) OPTIME
TOPICAL | Status: DC | PRN
  Administered 2016-10-16: 1000 mL

## 2016-10-16 MED ORDER — FENTANYL CITRATE (PF) 100 MCG/2ML IJ SOLN
INTRAMUSCULAR | Status: DC | PRN
  Administered 2016-10-16 (×5): 50 ug via INTRAVENOUS

## 2016-10-16 MED ORDER — CHLORHEXIDINE GLUCONATE 4 % EX LIQD: 60.0000 mL | Freq: Once | CUTANEOUS | Status: DC

## 2016-10-16 MED ORDER — NEOSTIGMINE METHYLSULFATE 5 MG/5ML IV SOSY
PREFILLED_SYRINGE | INTRAVENOUS | Status: AC
  Filled 2016-10-16: qty 5

## 2016-10-16 MED ORDER — HYDROMORPHONE HCL 1 MG/ML IJ SOLN
0.2500 mg | INTRAMUSCULAR | Status: DC | PRN
  Administered 2016-10-16 (×2): 0.5 mg via INTRAVENOUS

## 2016-10-16 MED ORDER — DEXAMETHASONE SODIUM PHOSPHATE 10 MG/ML IJ SOLN
INTRAMUSCULAR | Status: DC | PRN
  Administered 2016-10-16: 10 mg via INTRAVENOUS

## 2016-10-16 MED ORDER — ROCURONIUM BROMIDE 10 MG/ML (PF) SYRINGE
PREFILLED_SYRINGE | INTRAVENOUS | Status: DC | PRN
  Administered 2016-10-16: 50 mg via INTRAVENOUS

## 2016-10-16 MED ORDER — DOCUSATE SODIUM 100 MG PO CAPS
100.0000 mg | ORAL_CAPSULE | Freq: Two times a day (BID) | ORAL | Status: DC
  Administered 2016-10-16 – 2016-10-17 (×3): 100 mg via ORAL
  Filled 2016-10-16 (×3): qty 1

## 2016-10-16 MED ORDER — ONDANSETRON HCL 4 MG/2ML IJ SOLN
INTRAMUSCULAR | Status: DC | PRN
  Administered 2016-10-16: 4 mg via INTRAVENOUS

## 2016-10-16 MED ORDER — HYDROMORPHONE HCL 1 MG/ML IJ SOLN
INTRAMUSCULAR | Status: AC
  Filled 2016-10-16: qty 1

## 2016-10-16 MED ORDER — BISACODYL 5 MG PO TBEC: 5.0000 mg | DELAYED_RELEASE_TABLET | Freq: Every day | ORAL | Status: DC | PRN

## 2016-10-16 MED ORDER — HYDROMORPHONE HCL 1 MG/ML IJ SOLN
INTRAMUSCULAR | Status: AC
  Filled 2016-10-16: qty 0.5

## 2016-10-16 MED ORDER — KETOROLAC TROMETHAMINE 15 MG/ML IJ SOLN
7.5000 mg | Freq: Four times a day (QID) | INTRAMUSCULAR | Status: AC
  Administered 2016-10-16 – 2016-10-17 (×4): 7.5 mg via INTRAVENOUS
  Filled 2016-10-16 (×4): qty 1

## 2016-10-16 MED ORDER — POLYETHYLENE GLYCOL 3350 17 G PO PACK: 17.0000 g | PACK | Freq: Every day | ORAL | Status: DC | PRN

## 2016-10-16 MED ORDER — PROMETHAZINE HCL 25 MG/ML IJ SOLN: 6.2500 mg | INTRAMUSCULAR | Status: DC | PRN

## 2016-10-16 MED ORDER — GLYCOPYRROLATE 0.2 MG/ML IV SOSY
PREFILLED_SYRINGE | INTRAVENOUS | Status: DC | PRN
  Administered 2016-10-16: 0.6 mg via INTRAVENOUS

## 2016-10-16 MED ORDER — HYDROMORPHONE HCL 1 MG/ML IJ SOLN: 1.0000 mg | INTRAMUSCULAR | Status: DC | PRN

## 2016-10-16 MED ORDER — LACTATED RINGERS IV SOLN
INTRAVENOUS | Status: DC
  Administered 2016-10-16: 75 mL/h via INTRAVENOUS
  Administered 2016-10-17: 01:00:00 via INTRAVENOUS

## 2016-10-16 MED ORDER — DIAZEPAM 5 MG PO TABS: 5.0000 mg | ORAL_TABLET | Freq: Four times a day (QID) | ORAL | Status: DC | PRN

## 2016-10-16 MED ORDER — LIDOCAINE 2% (20 MG/ML) 5 ML SYRINGE
INTRAMUSCULAR | Status: AC
  Filled 2016-10-16: qty 5

## 2016-10-16 MED ORDER — SUCCINYLCHOLINE CHLORIDE 20 MG/ML IJ SOLN
INTRAMUSCULAR | Status: DC | PRN
  Administered 2016-10-16: 120 mg via INTRAVENOUS

## 2016-10-16 MED ORDER — PHENYLEPHRINE HCL 10 MG/ML IJ SOLN
INTRAVENOUS | Status: DC | PRN
  Administered 2016-10-16: 25 ug/min via INTRAVENOUS

## 2016-10-16 MED ORDER — SCOPOLAMINE 1 MG/3DAYS TD PT72
MEDICATED_PATCH | TRANSDERMAL | Status: DC | PRN
  Administered 2016-10-16: 1 via TRANSDERMAL

## 2016-10-16 MED ORDER — METOCLOPRAMIDE HCL 5 MG/ML IJ SOLN: 5.0000 mg | Freq: Three times a day (TID) | INTRAMUSCULAR | Status: DC | PRN

## 2016-10-16 MED ORDER — ONDANSETRON HCL 4 MG PO TABS: 4.0000 mg | ORAL_TABLET | Freq: Four times a day (QID) | ORAL | Status: DC | PRN

## 2016-10-16 MED ORDER — ONDANSETRON HCL 4 MG/2ML IJ SOLN
INTRAMUSCULAR | Status: AC
  Filled 2016-10-16: qty 2

## 2016-10-16 MED ORDER — CITALOPRAM HYDROBROMIDE 20 MG PO TABS
20.0000 mg | ORAL_TABLET | Freq: Every day | ORAL | Status: DC
  Administered 2016-10-17: 20 mg via ORAL
  Filled 2016-10-16: qty 1

## 2016-10-16 MED ORDER — ALUM & MAG HYDROXIDE-SIMETH 200-200-20 MG/5ML PO SUSP: 30.0000 mL | ORAL | Status: DC | PRN

## 2016-10-16 MED ORDER — LACTATED RINGERS IV SOLN: INTRAVENOUS | Status: DC

## 2016-10-16 MED ORDER — METOCLOPRAMIDE HCL 5 MG PO TABS: 5.0000 mg | ORAL_TABLET | Freq: Three times a day (TID) | ORAL | Status: DC | PRN

## 2016-10-16 MED ORDER — ACETAMINOPHEN 325 MG PO TABS: 650.0000 mg | ORAL_TABLET | Freq: Four times a day (QID) | ORAL | Status: DC | PRN

## 2016-10-16 MED ORDER — SUCCINYLCHOLINE CHLORIDE 200 MG/10ML IV SOSY
PREFILLED_SYRINGE | INTRAVENOUS | Status: AC
  Filled 2016-10-16: qty 10

## 2016-10-16 MED ORDER — FENTANYL CITRATE (PF) 250 MCG/5ML IJ SOLN
INTRAMUSCULAR | Status: AC
Start: 2016-10-16 — End: ?
  Filled 2016-10-16: qty 5

## 2016-10-16 MED ORDER — DIPHENHYDRAMINE HCL 12.5 MG/5ML PO ELIX
12.5000 mg | ORAL_SOLUTION | ORAL | Status: DC | PRN
Start: 2016-10-16 — End: 2016-10-17

## 2016-10-16 MED ORDER — OXYCODONE HCL 5 MG PO TABS
5.0000 mg | ORAL_TABLET | ORAL | Status: DC | PRN
  Administered 2016-10-16 – 2016-10-17 (×2): 10 mg via ORAL
  Administered 2016-10-17: 5 mg via ORAL
  Administered 2016-10-17: 10 mg via ORAL
  Filled 2016-10-16: qty 2
  Filled 2016-10-16: qty 1
  Filled 2016-10-16 (×2): qty 2

## 2016-10-16 MED ORDER — EPHEDRINE 5 MG/ML INJ
INTRAVENOUS | Status: AC
  Filled 2016-10-16: qty 10

## 2016-10-16 MED ORDER — EPHEDRINE SULFATE-NACL 50-0.9 MG/10ML-% IV SOSY
PREFILLED_SYRINGE | INTRAVENOUS | Status: DC | PRN
  Administered 2016-10-16 (×2): 5 mg via INTRAVENOUS
  Administered 2016-10-16 (×2): 2.5 mg via INTRAVENOUS
  Administered 2016-10-16: 5 mg via INTRAVENOUS

## 2016-10-16 MED ORDER — ROPIVACAINE HCL 5 MG/ML IJ SOLN
INTRAMUSCULAR | Status: DC | PRN
  Administered 2016-10-16: 30 mL via PERINEURAL

## 2016-10-16 MED ORDER — DEXAMETHASONE SODIUM PHOSPHATE 10 MG/ML IJ SOLN
INTRAMUSCULAR | Status: AC
  Filled 2016-10-16: qty 1

## 2016-10-16 MED ORDER — FLEET ENEMA 7-19 GM/118ML RE ENEM: 1.0000 | ENEMA | Freq: Once | RECTAL | Status: DC | PRN

## 2016-10-16 MED ORDER — PROPOFOL 10 MG/ML IV BOLUS
INTRAVENOUS | Status: AC
  Filled 2016-10-16: qty 40

## 2016-10-16 MED ORDER — PROPOFOL 10 MG/ML IV BOLUS
INTRAVENOUS | Status: DC | PRN
  Administered 2016-10-16: 200 mg via INTRAVENOUS
  Administered 2016-10-16: 10 mg via INTRAVENOUS

## 2016-10-16 MED ORDER — LACTATED RINGERS IV SOLN
INTRAVENOUS | Status: DC | PRN
  Administered 2016-10-16 (×3): via INTRAVENOUS

## 2016-10-16 MED ORDER — MENTHOL 3 MG MT LOZG: 1.0000 | LOZENGE | OROMUCOSAL | Status: DC | PRN

## 2016-10-16 MED ORDER — LIDOCAINE 2% (20 MG/ML) 5 ML SYRINGE
INTRAMUSCULAR | Status: DC | PRN
  Administered 2016-10-16: 60 mg via INTRAVENOUS

## 2016-10-16 MED ORDER — CEFAZOLIN SODIUM-DEXTROSE 2-4 GM/100ML-% IV SOLN
2.0000 g | Freq: Four times a day (QID) | INTRAVENOUS | Status: AC
  Administered 2016-10-16 – 2016-10-17 (×3): 2 g via INTRAVENOUS
  Filled 2016-10-16 (×3): qty 100

## 2016-10-16 MED ORDER — MIDAZOLAM HCL 2 MG/2ML IJ SOLN
INTRAMUSCULAR | Status: AC
  Filled 2016-10-16: qty 2

## 2016-10-16 MED ORDER — ROCURONIUM BROMIDE 10 MG/ML (PF) SYRINGE
PREFILLED_SYRINGE | INTRAVENOUS | Status: AC
  Filled 2016-10-16: qty 5

## 2016-10-16 MED ORDER — HYDROCHLOROTHIAZIDE 25 MG PO TABS
25.0000 mg | ORAL_TABLET | Freq: Every day | ORAL | Status: DC
  Administered 2016-10-16 – 2016-10-17 (×2): 25 mg via ORAL
  Filled 2016-10-16 (×2): qty 1

## 2016-10-16 MED ORDER — ONDANSETRON HCL 4 MG/2ML IJ SOLN: 4.0000 mg | Freq: Four times a day (QID) | INTRAMUSCULAR | Status: DC | PRN

## 2016-10-16 MED ORDER — ACETAMINOPHEN 650 MG RE SUPP: 650.0000 mg | Freq: Four times a day (QID) | RECTAL | Status: DC | PRN

## 2016-10-16 MED ORDER — MEPERIDINE HCL 25 MG/ML IJ SOLN: 6.2500 mg | INTRAMUSCULAR | Status: DC | PRN

## 2016-10-16 MED ORDER — MIDAZOLAM HCL 5 MG/5ML IJ SOLN
INTRAMUSCULAR | Status: DC | PRN
  Administered 2016-10-16 (×2): 1 mg via INTRAVENOUS

## 2016-10-16 MED ORDER — NEOSTIGMINE METHYLSULFATE 5 MG/5ML IV SOSY
PREFILLED_SYRINGE | INTRAVENOUS | Status: DC | PRN
  Administered 2016-10-16: 4 mg via INTRAVENOUS

## 2016-10-16 SURGICAL SUPPLY — 60 items
BLADE SAW SGTL 83.5X18.5 (BLADE) ×2 IMPLANT
CALIBRATOR GLENOID VIP 5-D (SYSTAGENIX WOUND MANAGEMENT) ×2 IMPLANT
CEMENT BONE DEPUY (Cement) ×2 IMPLANT
COVER SURGICAL LIGHT HANDLE (MISCELLANEOUS) ×2 IMPLANT
DERMABOND ADHESIVE PROPEN (GAUZE/BANDAGES/DRESSINGS) ×1
DERMABOND ADVANCED .7 DNX6 (GAUZE/BANDAGES/DRESSINGS) ×1 IMPLANT
DRAPE ORTHO SPLIT 77X108 STRL (DRAPES) ×2
DRAPE SURG 17X11 SM STRL (DRAPES) ×2 IMPLANT
DRAPE SURG ORHT 6 SPLT 77X108 (DRAPES) ×2 IMPLANT
DRAPE U-SHAPE 47X51 STRL (DRAPES) ×2 IMPLANT
DRESSING AQUACEL AQ EXTRA 4X5 (GAUZE/BANDAGES/DRESSINGS) ×2 IMPLANT
DRSG AQUACEL AG ADV 3.5X10 (GAUZE/BANDAGES/DRESSINGS) ×2 IMPLANT
DURAPREP 26ML APPLICATOR (WOUND CARE) ×2 IMPLANT
ELECT BLADE 4.0 EZ CLEAN MEGAD (MISCELLANEOUS) ×2
ELECT CAUTERY BLADE 6.4 (BLADE) ×2 IMPLANT
ELECT REM PT RETURN 9FT ADLT (ELECTROSURGICAL) ×2
ELECTRODE BLDE 4.0 EZ CLN MEGD (MISCELLANEOUS) ×1 IMPLANT
ELECTRODE REM PT RTRN 9FT ADLT (ELECTROSURGICAL) ×1 IMPLANT
FACESHIELD WRAPAROUND (MASK) ×6 IMPLANT
GLENOID WITH CLEAT MEDIUM (Shoulder) ×2 IMPLANT
GLOVE BIO SURGEON STRL SZ7.5 (GLOVE) ×2 IMPLANT
GLOVE BIO SURGEON STRL SZ8 (GLOVE) ×2 IMPLANT
GLOVE EUDERMIC 7 POWDERFREE (GLOVE) ×2 IMPLANT
GLOVE SS BIOGEL STRL SZ 7.5 (GLOVE) ×1 IMPLANT
GLOVE SUPERSENSE BIOGEL SZ 7.5 (GLOVE) ×1
GOWN STRL REUS W/ TWL LRG LVL3 (GOWN DISPOSABLE) ×1 IMPLANT
GOWN STRL REUS W/ TWL XL LVL3 (GOWN DISPOSABLE) ×2 IMPLANT
GOWN STRL REUS W/TWL LRG LVL3 (GOWN DISPOSABLE) ×1
GOWN STRL REUS W/TWL XL LVL3 (GOWN DISPOSABLE) ×2
HEAD HUMERAL USP II 44/19 (Head) ×2 IMPLANT
KIT BASIN OR (CUSTOM PROCEDURE TRAY) ×2 IMPLANT
KIT ROOM TURNOVER OR (KITS) ×2 IMPLANT
KIT SET UNIVERSAL (KITS) ×2 IMPLANT
MANIFOLD NEPTUNE II (INSTRUMENTS) ×2 IMPLANT
NDL SUT .5 MAYO 1.404X.05X (NEEDLE) ×1 IMPLANT
NDL SUT 6 .5 CRC .975X.05 MAYO (NEEDLE) ×1 IMPLANT
NEEDLE MAYO TAPER (NEEDLE) ×2
NS IRRIG 1000ML POUR BTL (IV SOLUTION) ×2 IMPLANT
PACK SHOULDER (CUSTOM PROCEDURE TRAY) ×2 IMPLANT
PAD ARMBOARD 7.5X6 YLW CONV (MISCELLANEOUS) ×4 IMPLANT
RESTRAINT HEAD UNIVERSAL NS (MISCELLANEOUS) ×2 IMPLANT
SLING ARM FOAM STRAP LRG (SOFTGOODS) IMPLANT
SLING ARM XL FOAM STRAP (SOFTGOODS) ×2 IMPLANT
SMARTMIX MINI TOWER (MISCELLANEOUS) ×2
SPONGE LAP 18X18 X RAY DECT (DISPOSABLE) ×2 IMPLANT
SPONGE LAP 4X18 X RAY DECT (DISPOSABLE) IMPLANT
STEM HUMERAL APEX UNI 9MM (Stem) ×2 IMPLANT
SUCTION FRAZIER HANDLE 10FR (MISCELLANEOUS) ×1
SUCTION TUBE FRAZIER 10FR DISP (MISCELLANEOUS) ×1 IMPLANT
SUT FIBERWIRE #2 38 T-5 BLUE (SUTURE) ×8
SUT MNCRL AB 3-0 PS2 18 (SUTURE) ×2 IMPLANT
SUT MON AB 2-0 CT1 36 (SUTURE) ×2 IMPLANT
SUT VIC AB 1 CT1 27 (SUTURE) ×1
SUT VIC AB 1 CT1 27XBRD ANBCTR (SUTURE) ×1 IMPLANT
SUTURE FIBERWR #2 38 T-5 BLUE (SUTURE) ×4 IMPLANT
SYR CONTROL 10ML LL (SYRINGE) IMPLANT
TOWEL OR 17X24 6PK STRL BLUE (TOWEL DISPOSABLE) IMPLANT
TOWEL OR 17X26 10 PK STRL BLUE (TOWEL DISPOSABLE) ×2 IMPLANT
TOWER SMARTMIX MINI (MISCELLANEOUS) ×1 IMPLANT
WATER STERILE IRR 1000ML POUR (IV SOLUTION) IMPLANT

## 2016-10-16 NOTE — H&P (Signed)
Katrina Huffman    Chief Complaint: Right shoulder osteoarthritis  HPI: The patient is a 61 y.o. female with end stage right shoulder OA  Past Medical History:  Diagnosis Date  . Anxiety   . Arthritis    hands, knees, back  . Depression   . Edema    left leg knee to foot, since veein stripping  . History of chicken pox   . History of hiatal hernia   . Hypertension   . PONV (postoperative nausea and vomiting)    PONV after knee arthroplasty  . Scoliosis    sees chiropracter monthly  . Shortness of breath dyspnea    "out of shape"  . Sleep apnea    cpap    Past Surgical History:  Procedure Laterality Date  . BREAST SURGERY  1999   breast biopsy  . BUNIONECTOMY  2000  . [place]   . COLONOSCOPY    . COLONOSCOPY WITH PROPOFOL N/A 04/30/2015   Procedure: COLONOSCOPY WITH PROPOFOL;  Surgeon: [name], MD;  Location: [facility];  Service: Endoscopy;  Laterality: N/A;  . DILATION AND CURETTAGE OF UTERUS     x2 last one 1/17  . JOINT REPLACEMENT     LTKA-3 yrs ago  . KNEE ARTHROSCOPY Left 05/09/2015   Procedure: ARTHROSCOPY LEFT KNEE WITH SYNOVECTOMY;  Surgeon: [name], MD;  Location: [facility];  Service: Orthopedics;  Laterality: Left;  . KNEE SURGERY Left 2009   x 2-meniscus, debridement  . LASIK    . POLYPECTOMY  04/30/2015   Procedure: POLYPECTOMY;  Surgeon: [name], MD;  Location: [facility];  Service: Endoscopy;;  . TONSILLECTOMY AND ADENOIDECTOMY  2007  . TOTAL KNEE ARTHROPLASTY WITH REVISION COMPONENTS Left 04/16/2016   Procedure: LEFT TOTAL KNEE ARTHROPLASTY WITH POLY REVISION;  Surgeon: [name], MD;  Location: [facility];  Service: Orthopedics;  Laterality: Left;  with abductor block  . [place]  . VEIN LIGATION AND STRIPPING  2000    Family History  Problem Relation Age of Onset  . Arthritis Father   . Cancer Father        prostate CA  . Hypertension Father   . Diabetes Brother   . Arthritis  Mother   . Hypertension Mother   . Cancer Paternal Aunt        breast cancer  . Arthritis Maternal Grandmother   . Hypertension Maternal Grandmother   . Arthritis Maternal Grandfather   . Hypertension Maternal Grandfather   . Diabetes Maternal Grandfather     Social History:  reports that she has never smoked. She has never used smokeless tobacco. She reports that she does not drink alcohol or use drugs.   Medications Prior to Admission  Medication Sig Dispense Refill  . ALPRAZolam (XANAX) 0.5 MG tablet Take 0.5 mg by mouth daily as needed for anxiety.    . citalopram (CELEXA) 20 MG tablet Take 20 mg by mouth daily.      . Cyanocobalamin (B-12 COMPLIANCE INJECTION IJ) Inject as directed every 30 (thirty) days.    . cyanocobalamin 500 MCG tablet Take 500 mcg by mouth daily.    . hydrochlorothiazide (HYDRODIURIL) 25 MG tablet Take 25 mg by mouth daily.    . meloxicam (MOBIC) 15 MG tablet Take 15 mg by mouth daily.    . Menthol, Topical Analgesic, (BIOFREEZE EX) Apply 1 application topically daily. To shoulder and knees    . amoxicillin (AMOXIL) 500  MG tablet Take 2,000 mg by mouth See admin instructions. Takes 4 tablets 1 hour prior to dental procedures    . aspirin EC 325 MG EC tablet Take 1 tablet (325 mg total) by mouth 2 (two) times daily. Take twice a day for four weeks, then may discontinue the Aspirin 325 mg. (Patient not taking: Reported on 10/08/2016) 48 tablet 0  . methocarbamol (ROBAXIN) 500 MG tablet Take 1 tablet (500 mg total) by mouth every 6 (six) hours as needed for muscle spasms. (Patient not taking: Reported on 10/08/2016) 80 tablet 0  . oxyCODONE (OXY IR/ROXICODONE) 5 MG immediate release tablet Take 1-2 tablets (5-10 mg total) by mouth every 4 (four) hours as needed for moderate pain or severe pain. (Patient not taking: Reported on 10/08/2016) 84 tablet 0  . Vitamin D, Ergocalciferol, (DRISDOL) 50000 units CAPS capsule Take 50,000 Units by mouth once a week.   3      Physical Exam: right shoulder with painful and restricted motion as noted at recent office visits  Vitals  Temp:  [98.1 F (36.7 C)] 98.1 F (36.7 C) (09/13 5176) Pulse Rate:  [62] 62 (09/13 0613) Resp:  [20] 20 (09/13 0613) BP: (127)/(61) 127/61 (09/13 0613) SpO2:  [95 %] 95 % (09/13 1607)  Assessment/Plan  Impression: Right shoulder osteoarthritis   Plan of Action: Procedure(s): TOTAL SHOULDER ARTHROPLASTY  Katrina Huffman M Shantia Sanford 10/16/2016, 7:21 AM Contact # [phone]

## 2016-10-16 NOTE — Transfer of Care (Signed)
Immediate Anesthesia Transfer of Care Note  Patient: [name]  Procedure(s) Performed: Procedure(s): TOTAL SHOULDER ARTHROPLASTY (Right)  Patient Location: PACU  Anesthesia Type:General and Regional  Level of Consciousness: patient cooperative and responds to stimulation  Airway & Oxygen Therapy: Patient Spontanous Breathing and Patient connected to face mask oxygen  Post-op Assessment: Report given to RN, Post -op Vital signs reviewed and stable and Patient moving all extremities X 4  Post vital signs: Reviewed and stable  Last Vitals:  Vitals:   10/16/16 0613  BP: 127/61  Pulse: 62  Resp: 20  Temp: 36.7 C  SpO2: 95%    Last Pain:  Vitals:   10/16/16 0615  TempSrc:   PainSc: 6          Complications: No apparent anesthesia complications

## 2016-10-16 NOTE — Op Note (Signed)
10/16/2016  9:33 AM  PATIENT:   Katrina Huffman  61 y.o. female  PRE-OPERATIVE DIAGNOSIS:  Right shoulder osteoarthritis   POST-OPERATIVE DIAGNOSIS:  same  PROCEDURE:  R TSA #9 stem, 44x19 head, medium glenoid  SURGEON:  Mohogany Toppins, Metta Clines M.D.  ASSISTANTS: [place] pac   ANESTHESIA:   GET + ISB  EBL: 250  SPECIMEN:  none  Drains: none   PATIENT DISPOSITION:  PACU - hemodynamically stable.    PLAN OF CARE: Admit for overnight observation  Dictation# ???   Contact # [phone]

## 2016-10-16 NOTE — Anesthesia Procedure Notes (Addendum)
Anesthesia Regional Block: Interscalene brachial plexus block   Pre-Anesthetic Checklist: ,, timeout performed, Correct Patient, Correct Site, Correct Laterality, Correct Procedure, Correct Position, site marked, Risks and benefits discussed,  Surgical consent,  Pre-op evaluation,  At surgeon's request and post-op pain management  Laterality: Right  Prep: chloraprep       Needles:  Injection technique: Single-shot  Needle Type: Echogenic Needle     Needle Length: 9cm  Needle Gauge: 21     Additional Needles:   Procedures: ultrasound guided,,,,,,,,  Narrative:  Start time: 10/16/2016 7:15 AM End time: 10/16/2016 7:25 AM Injection made incrementally with aspirations every 5 mL.  Performed by: Personally  Anesthesiologist: Suella Broad D  Additional Notes: Tolerated well.   Abnormal anatomy, many vascular structures around brachial plexus, utilized out of plane technique.

## 2016-10-16 NOTE — Discharge Instructions (Signed)

## 2016-10-16 NOTE — Anesthesia Postprocedure Evaluation (Signed)
Anesthesia Post Note  Patient: Katrina Huffman  Procedure(s) Performed: Procedure(s) (LRB): TOTAL SHOULDER ARTHROPLASTY (Right)     Patient location during evaluation: PACU Anesthesia Type: General Level of consciousness: awake and alert Pain management: pain level controlled Vital Signs Assessment: post-procedure vital signs reviewed and stable Respiratory status: spontaneous breathing, nonlabored ventilation, respiratory function stable and patient connected to nasal cannula oxygen Cardiovascular status: blood pressure returned to baseline and stable Postop Assessment: no apparent nausea or vomiting Anesthetic complications: no    Last Vitals:  Vitals:   10/16/16 1130 10/16/16 1150  BP:  (!) 138/58  Pulse: (!) 102 92  Resp: 14   Temp: (!) 36.1 C (!) 36.4 C  SpO2: 91% (!) 82%    Last Pain:  Vitals:   10/16/16 1220  TempSrc:   PainSc: [place]

## 2016-10-17 ENCOUNTER — Encounter (HOSPITAL_COMMUNITY): Payer: Self-pay | Admitting: General Practice

## 2016-10-17 DIAGNOSIS — F419 Anxiety disorder, unspecified: Secondary | ICD-10-CM | POA: Diagnosis not present

## 2016-10-17 DIAGNOSIS — Z79899 Other long term (current) drug therapy: Secondary | ICD-10-CM | POA: Diagnosis not present

## 2016-10-17 DIAGNOSIS — G4730 Sleep apnea, unspecified: Secondary | ICD-10-CM | POA: Diagnosis not present

## 2016-10-17 DIAGNOSIS — F329 Major depressive disorder, single episode, unspecified: Secondary | ICD-10-CM | POA: Diagnosis not present

## 2016-10-17 DIAGNOSIS — M19011 Primary osteoarthritis, right shoulder: Secondary | ICD-10-CM | POA: Diagnosis not present

## 2016-10-17 DIAGNOSIS — Z96652 Presence of left artificial knee joint: Secondary | ICD-10-CM | POA: Diagnosis not present

## 2016-10-17 DIAGNOSIS — I10 Essential (primary) hypertension: Secondary | ICD-10-CM | POA: Diagnosis not present

## 2016-10-17 DIAGNOSIS — M25711 Osteophyte, right shoulder: Secondary | ICD-10-CM | POA: Diagnosis not present

## 2016-10-17 DIAGNOSIS — Z791 Long term (current) use of non-steroidal anti-inflammatories (NSAID): Secondary | ICD-10-CM | POA: Diagnosis not present

## 2016-10-17 DIAGNOSIS — Z9989 Dependence on other enabling machines and devices: Secondary | ICD-10-CM | POA: Diagnosis not present

## 2016-10-17 MED ORDER — OXYCODONE-ACETAMINOPHEN 5-325 MG PO TABS: 1.0000 | ORAL_TABLET | ORAL | 0 refills | Status: DC | PRN

## 2016-10-17 MED ORDER — ONDANSETRON HCL 4 MG PO TABS: 4.0000 mg | ORAL_TABLET | Freq: Three times a day (TID) | ORAL | 0 refills | Status: DC | PRN

## 2016-10-17 NOTE — Care Management Note (Signed)
Case Management Note  Patient Details  Name: Katrina Huffman MRN: [id number] Date of Birth: 06-24-1955  Subjective/Objective:                 Patient with order to DC to home today. Chart reviewed. No Home Health or Equipment needs, no unacknowledged Case Management consults or medication needs identified at the time of this note. Plan for DC to home. If needs arise today prior to discharge, please call Carles Collet RN CM at [phone].    Action/Plan:   Expected Discharge Date:  10/17/16               Expected Discharge Plan:  Home/Self Care  In-House Referral:     Discharge planning Services  CM Consult  Post Acute Care Choice:    Choice offered to:     DME Arranged:    DME Agency:     HH Arranged:    HH Agency:     Status of Service:  Completed, signed off  If discussed at [employer] of Stay Meetings, dates discussed:    Additional Comments:  Carles Collet, RN 10/17/2016, 10:54 AM

## 2016-10-17 NOTE — Evaluation (Signed)
Occupational Therapy Evaluation Patient Details Name: Katrina Huffman MRN: [id number] DOB: 1955-07-05 Today's Date: 10/17/2016    History of Present Illness s/p R TOTAL SHOULDER ARTHROPLASTY.   Clinical Impression   Pt admitted with the above diagnoses and presents with below problem list. PTA pt was independent with ADLs. Pt currently setup to min A with ADLs. Pt reports she will have assistance at home. All ADL, sling, exercise, and precaution education completed with pt. Session details below. Pt is for d/c home today. No further acute OT needs. OT signing off.     Follow Up Recommendations  DC plan and follow up therapy as arranged by surgeon    Equipment Recommendations  None recommended by OT    Recommendations for Other Services       Precautions / Restrictions Precautions Precautions: Shoulder Type of Shoulder Precautions: passive protocal Shoulder Interventions: Shoulder sling/immobilizer;Off for dressing/bathing/exercises Precaution Booklet Issued: Yes (comment) Required Braces or Orthoses: Sling Restrictions Weight Bearing Restrictions: Yes Other Position/Activity Restrictions: NWB RUE      Mobility Bed Mobility Overal bed mobility: Needs Assistance Bed Mobility: Supine to Sit;Sit to Supine           General bed mobility comments: discussed options for home  Transfers Overall transfer level: Needs assistance   Transfers: Sit to/from Stand Sit to Stand: Min guard         General transfer comment: from EOB    Balance Overall balance assessment: Needs assistance         Standing balance support: No upper extremity supported Standing balance-Leahy Scale: Good                             ADL either performed or assessed with clinical judgement   ADL Overall ADL's : Needs assistance/impaired Eating/Feeding: Set up;Sitting   Grooming: Minimal assistance;Sitting;Bed level Grooming Details (indicate cue type and reason): assist for  bilateral tasks Upper Body Bathing: Sitting;Set up;Minimal assistance   Lower Body Bathing: Min guard;Sit to/from stand   Upper Body Dressing : Minimal assistance;Sitting   Lower Body Dressing: Min guard;Sit to/from stand   Toilet Transfer: Min guard;Ambulation   Toileting- Clothing Manipulation and Hygiene: Min guard;Sit to/from stand   Tub/ Shower Transfer: Tub transfer;Min Chief Executive Officer Details (indicate cue type and reason): discussed having someone with her at home ofr this Functional mobility during ADLs: Min guard General ADL Comments: Pt completed bed mobility, UB/LB dressing, and in-room functional mobility. ADL education completed.     Vision         Perception     Praxis      Pertinent Vitals/Pain Pain Assessment: Faces Faces Pain Scale: Hurts little more Pain Location: R shoulder when out of sling Pain Descriptors / Indicators: Sore Pain Intervention(s): Limited activity within patient's tolerance;Monitored during session;Repositioned;Ice applied     Hand Dominance Left   Extremity/Trunk Assessment Upper Extremity Assessment Upper Extremity Assessment: RUE deficits/detail RUE Deficits / Details: s/p TOTAL SHOULDER ARTHROPLASTY.   Lower Extremity Assessment Lower Extremity Assessment: Overall WFL for tasks assessed       Communication Communication Communication: No difficulties   Cognition Arousal/Alertness: Awake/alert Behavior During Therapy: WFL for tasks assessed/performed Overall Cognitive Status: Within Functional Limits for tasks assessed                                     General Comments  Exercises Exercises: Shoulder Shoulder Exercises Pendulum Exercise: PROM;Right;10 reps;Standing Elbow Flexion: AROM;10 reps;Supine;Right Elbow Extension: AROM;Right;10 reps;Supine Wrist Flexion: AROM;5 reps;Seated;Right Wrist Extension: AROM;Right;5 reps;Seated Digit Composite Flexion: AROM;Right;5  reps;Seated Composite Extension: AROM;Right;5 reps;Seated   Shoulder Instructions Shoulder Instructions Donning/doffing shirt without moving shoulder: Minimal assistance Method for sponge bathing under operated UE: Min-guard Donning/doffing sling/immobilizer: Minimal assistance Correct positioning of sling/immobilizer: Supervision/safety Pendulum exercises (written home exercise program): Min-guard ROM for elbow, wrist and digits of operated UE: Independent Sling wearing schedule (on at all times/off for ADL's): Independent Proper positioning of operated UE when showering: Independent Positioning of UE while sleeping: Modified independent    Home Living Family/patient expects to be discharged to:: Private residence Living Arrangements: Alone Available Help at Discharge: Family Type of Home: House Home Access: Stairs to enter Technical brewer of Steps: 1   Home Layout: One level         Biochemist, clinical: Handicapped height     Home Equipment: Clinical cytogeneticist - 2 wheels   Additional Comments: has sink next to high commode      Prior Functioning/Environment Level of Independence: Independent        Comments: drives, works        OT Problem List: Impaired balance (sitting and/or standing);Decreased knowledge of use of DME or AE;Decreased knowledge of precautions;Pain;Impaired UE functional use      OT Treatment/Interventions:      OT Goals(Current goals can be found in the care plan section)    OT Frequency:     Barriers to D/C:            Co-evaluation              AM-PAC PT "6 Clicks" Daily Activity     Outcome Measure Help from another person eating meals?: None Help from another person taking care of personal grooming?: A Little Help from another person toileting, which includes using toliet, bedpan, or urinal?: A Little Help from another person bathing (including washing, rinsing, drying)?: A Little Help from another person to put on and  taking off regular upper body clothing?: A Little Help from another person to put on and taking off regular lower body clothing?: None 6 Click Score: 20   End of Session Equipment Utilized During Treatment: Other (comment) (sling)  Activity Tolerance: Patient tolerated treatment well Patient left: in bed;with call bell/phone within reach;with bed alarm set  OT Visit Diagnosis: Pain Pain - Right/Left: Right Pain - part of body: Shoulder                Time: 7902-4097 OT Time Calculation (min): 42 min Charges:  OT General Charges $OT Visit: 1 Visit OT Evaluation $OT Eval Low Complexity: 1 Low OT Treatments $Self Care/Home Management : 8-22 mins $Therapeutic Exercise: 8-22 mins G-Codes: OT G-codes **NOT FOR INPATIENT CLASS** Functional Assessment Tool Used: AM-PAC 6 Clicks Daily Activity Functional Limitation: Self care Self Care Current Status (D5329): At least 20 percent but less than 40 percent impaired, limited or restricted Self Care Discharge Status [phone]): At least 20 percent but less than 40 percent impaired, limited or restricted     Hortencia Pilar 10/17/2016, 9:37 AM

## 2016-10-17 NOTE — Care Management Note (Signed)
Case Management Note  Patient Details  Name: ARCHIE ATILANO MRN: [id number] Date of Birth: 08/11/1955  Subjective/Objective:                 TOTAL SHOULDER ARTHROPLASTY   Action/Plan:  CM will continue to follow for DC planning needs.  Expected Discharge Date:                  Expected Discharge Plan:  Home/Self Care  In-House Referral:     Discharge planning Services  CM Consult  Post Acute Care Choice:    Choice offered to:     DME Arranged:    DME Agency:     HH Arranged:    HH Agency:     Status of Service:  In process, will continue to follow  If discussed at Long Length of Stay Meetings, dates discussed:    Additional Comments:  Carles Collet, RN 10/17/2016, 8:00 AM

## 2016-10-17 NOTE — Op Note (Signed)
NAME:  Katrina Huffman, Katrina Huffman NO.:  MEDICAL RECORD NO.:  [id number]  LOCATION:                                 FACILITY:  PHYSICIAN:  Metta Clines. Sharyn Brilliant, M.D.  DATE OF BIRTH:  [birth year]  DATE OF PROCEDURE:  10/16/2016 DATE OF DISCHARGE:                              OPERATIVE REPORT   PREOPERATIVE DIAGNOSIS:  End-stage right shoulder osteoarthritis.  POSTOPERATIVE DIAGNOSIS:  End-stage right shoulder osteoarthritis.  PROCEDURE:  Right total shoulder arthroplasty utilizing a press-fit size 9 Arthrex stem with a 44 x 19 head and a medium glenoid.  SURGEON:  Metta Clines. Zaide Mcclenahan, MD.  Terrence DupontReather Laurence. Shuford, PA-C.  ANESTHESIA:  General endotracheal as well as an interscalene block.  ESTIMATED BLOOD LOSS:  250 mL.  DRAINS:  None.  HISTORY:  Katrina Huffman is a 61 year old female, who has had chronic and progressive increasing right shoulder pain related to end-stage osteoarthritis.  Her symptoms have now deteriorated to this point that she is having a significant impact on functional capabilities.  She is brought to the operating room at this time for planned right total shoulder arthroplasty.  Preoperatively, I counseled Katrina Huffman regarding treatment options, potential risks versus benefits thereof.  Possible surgical complications were reviewed including bleeding, infection, vascular injury, persistent pain, loss of motion, anesthetic complication, failure of the implant, and possible need for additional surgery.  She understands and accepts and agrees to the planned procedure.  PROCEDURE IN DETAIL:  After undergoing routine preop evaluation, the patient received prophylactic antibiotics.  Interscalene block was established in the holding area by the Anesthesia Department.  Placed supine on the operating room table.  Underwent smooth induction of a general endotracheal anesthesia.  Placed in beach-chair position and appropriately padded and protected.   The right shoulder girdle region was sterilely prepped and draped in a standard fashion.  Time-out was called.  An anterior deltopectoral approach was made through a 10-cm incision.  Skin flaps were elevated.  Electrocautery was used for hemostasis.  The deltopectoral interval was then developed with the vein taken laterally, and the upper centimeter of the pec major was tenotomized to enhance exposure.  Retractors were placed.  Conjoined tendon mobilized and retracted medially.  The biceps tendon was then unroofed and tenotomized for later tenodesis.  We then split the rotator cuff along the rotator interval at the base of the coracoid and then separated the subscap from the lesser tuberosity leaving one 2.5 cm cuff of tissue on the tuberosity for later repair.  We divided the capsular attachments from the anteroinferior and inferior aspects of the humeral neck.  The free margin of the subscap was then tagged with a series of four #2 FiberWire sutures.  The humeral head was then delivered through the wound.  We outlined our proposed humeral head resection at the native retroversion approximately 30 degrees.  Carefully protected the rotator cuff superiorly and posteriorly and then performed the humeral head resection with an oscillating saw.  Then, a rongeur was used to remove the prominent osteophytes on the anterior and inferior margins of the humeral neck.  At this point, we gained access  to the humeral canal and then broached up to size 9 maintaining the retroversion with excellent fit and fixation.  A size 8 trial was then placed in the humeral canal with metal cap to protect the cut on the proximal humeral surface.  At this point, we then turned our attention to the glenoid, which was then exposed with a combination of Fukuda, pitchfork, and snake tongue retractors.  We performed a circumferential labral resection, mobilized the subscap and gained complete visualization of the  glenoid.  We then used the previously established parameters to set up our glenoid guide based on a preoperative CT scan.  The guide was then used to place a guide pin in the center of the glenoid.  We reamed with a medium reamer to a stable subchondral bony bed and removed residual bony debris on the margins of the glenoid.  We placed our central drill hole followed by the superior and inferior peg and slot respectively, broached the glenoid and trial showed excellent fit.  The glenoid was meticulously cleaned and dried.  We then introduced cement into the superior and inferior peg and slot respectively.  The glenoid was then impacted with excellent fit and fixation.  We then returned our attention back to the proximal humerus, which we irrigated, cleaned, and dried.  The humeral stem was then impacted with excellent interference fit and fixation.  We then performed terminal tightening of the proximal locking screws.  At this point, a series of trial reduction was then performed and ultimately felt that the 44 x 19 eccentric head gave [place] excellent coverage of the proximal humerus and good soft tissue balance with 50% translation of the humeral head or glenoid.  The final head was then impacted after the [facility] taper was cleaned and dried.  We then performed final reduction.  Again, the overall soft tissue balance and shoulder mobility were much to our satisfaction.  At this point, the subscapularis was then repaired back to the lesser tuberosity using horizontal mattress sutures through the cuff of tissue and then rotator interval was repaired with a series of 3 figure-of-eight #2 FiberWire sutures.  The biceps tendon was then tenodesed at the upper border of the pec major.  Proximal stump was excised.  We then copiously irrigated the wound once again.  Hemostasis was obtained.  The deltopectoral interval was then reapproximated with a series of figure-of-eight #1 Vicryl sutures.  2-0  Vicryl was used for the subcu layer, intracuticular 3-0 Monocryl for the skin followed by Dermabond and Aquacel dressing. Right arm was placed into a sling.  The patient was then awakened, extubated, and taken to the recovery in stable condition.  Jenetta Loges, PA-C, was used as an Environmental consultant throughout this case, was essential for help with positioning the patient, positioning the extremity, tissue manipulation, retraction, implantation of prosthesis, wound closure, and intraoperative decision making.     Metta Clines. Shilpa Bushee, M.D.     KMS/MEDQ  D:  10/16/2016  T:  10/16/2016  Job:  [id number]

## 2016-10-17 NOTE — Progress Notes (Signed)
Patient discharged via wheelchair with brother.

## 2016-10-17 NOTE — Discharge Summary (Signed)
PATIENT ID:      Katrina Huffman  MRN:     [id number] DOB/AGE:    [birth year] / 61 y.o.     DISCHARGE SUMMARY  ADMISSION DATE:    10/16/2016 DISCHARGE DATE:    ADMISSION DIAGNOSIS: Right shoulder osteoarthritis  Past Medical History:  Diagnosis Date  . Anxiety   . Arthritis    hands, knees, back  . Depression   . Edema    left leg knee to foot, since veein stripping  . History of chicken pox   . History of hiatal hernia   . Hypertension   . PONV (postoperative nausea and vomiting)    PONV after knee arthroplasty  . Scoliosis    sees chiropracter monthly  . Shortness of breath dyspnea    "out of shape"  . Sleep apnea    cpap    DISCHARGE DIAGNOSIS:   Active Problems:   S/P shoulder replacement   PROCEDURE: Procedure(s): TOTAL SHOULDER ARTHROPLASTY on 10/16/2016  CONSULTS:    HISTORY:  See H&P in chart.  HOSPITAL COURSE:  Katrina Huffman is a 61 y.o. admitted on 10/16/2016 with a diagnosis of Right shoulder osteoarthritis .  They were brought to the operating room on 10/16/2016 and underwent Procedure(s): TOTAL SHOULDER ARTHROPLASTY.    They were given perioperative antibiotics: Anti-infectives    Start     Dose/Rate Route Frequency Ordered Stop   10/16/16 1400  ceFAZolin (ANCEF) IVPB 2g/100 mL premix     2 g 200 mL/hr over 30 Minutes Intravenous Every 6 hours 10/16/16 1147 10/17/16 0256   10/16/16 0600  ceFAZolin (ANCEF) 3 g in dextrose 5 % 50 mL IVPB     3 g 130 mL/hr over 30 Minutes Intravenous On call to O.R. 10/15/16 1055 10/16/16 0753    .  Patient underwent the above named procedure and tolerated it well. The following day they were hemodynamically stable and pain was controlled on oral analgesics. They were neurovascularly intact to the operative extremity. OT was ordered and worked with patient per protocol. They were medically and orthopaedically stable for discharge on day 1 .    DIAGNOSTIC STUDIES:  RECENT RADIOGRAPHIC STUDIES :  Ct Shoulder Right Wo  Contrast  Result Date: 09/23/2016 CLINICAL DATA:  Preoperative examination. Patient for right shoulder replacement 10/16/2016. EXAM: CT OF THE UPPER RIGHT EXTREMITY WITHOUT CONTRAST TECHNIQUE: Multidetector CT imaging of the upper right extremity was performed according to the standard protocol. COMPARISON:  None. FINDINGS: Bones/Joint/Cartilage The patient has advanced glenohumeral osteoarthritis with bone-on-bone joint space narrowing and a large osteophyte off the medial margin of the humeral head. Subchondral cyst in the midpole of the glenoid measures 0.5 cm AP by 0.4 cm craniocaudal and extends 0.9 cm medially into the glenoid. A second subchondral cyst in the posterior, superior glenoid measures 1 cm AP by 0.4 cm craniocaudal by 0.2 cm transverse. There is no fracture or worrisome bony lesion. The acromion is type 2. There is moderate acromioclavicular osteoarthritis. Ligaments Suboptimally assessed by CT. Muscles and Tendons The rotator cuff appears intact. All imaged musculature is intact without atrophy or focal lesion. Soft tissues Imaged lung parenchyma is clear. Soft tissues about the shoulder appear normal. IMPRESSION: Advanced glenohumeral osteoarthritis as described above. Moderate acromioclavicular osteoarthritis. The rotator cuff is intact. Electronically Signed   By: Inge Rise M.D.   On: 09/23/2016 07:23    RECENT VITAL SIGNS:  Patient Vitals for the past 24 hrs:  BP Temp Temp src Pulse Resp  SpO2  10/17/16 0608 (!) 111/57 98.4 F (36.9 C) Oral - 18 93 %  10/16/16 2258 - - - 81 16 93 %  10/16/16 2120 119/76 98.2 F (36.8 C) Oral 79 15 96 %  10/16/16 1521 127/64 98.6 F (37 C) Oral 81 - 92 %  10/16/16 1150 (!) 138/58 (!) 97.5 F (36.4 C) Oral 92 - (!) 82 %  10/16/16 1130 - (!) 97 F (36.1 C) - (!) 102 14 91 %  10/16/16 1125 103/61 - - 92 11 92 %  10/16/16 1115 - - - 96 11 91 %  10/16/16 1110 123/60 - - 75 13 (!) 89 %  10/16/16 1100 - - - 94 12 90 %  10/16/16 1055 124/72  - - 92 16 92 %  10/16/16 1045 - - - 93 15 93 %  10/16/16 1040 118/84 - - 93 14 94 %  10/16/16 1030 - - - 79 14 91 %  10/16/16 1021 126/65 - - 80 17 98 %  10/16/16 1015 - - - 92 19 91 %  10/16/16 1006 138/82 - - 98 18 94 %  10/16/16 1005 - - - 97 20 95 %  10/16/16 1000 - - - 94 (!) 23 99 %  10/16/16 0951 (!) 170/77 - - (!) 110 (!) 22 99 %  10/16/16 0950 - (!) 97.3 F (36.3 C) - - - -  .  RECENT EKG RESULTS:    Orders placed or performed in visit on 03/05/16  . EKG 12-Lead    DISCHARGE INSTRUCTIONS:    DISCHARGE MEDICATIONS:   Allergies as of 10/17/2016   No Known Allergies     Medication List    STOP taking these medications   oxyCODONE 5 MG immediate release tablet Commonly known as:  Oxy IR/ROXICODONE     TAKE these medications   ALPRAZolam 0.5 MG tablet Commonly known as:  XANAX Take 0.5 mg by mouth daily as needed for anxiety.   amoxicillin 500 MG tablet Commonly known as:  AMOXIL Take 2,000 mg by mouth See admin instructions. Takes 4 tablets 1 hour prior to dental procedures   aspirin 325 MG EC tablet Take 1 tablet (325 mg total) by mouth 2 (two) times daily. Take twice a day for four weeks, then may discontinue the Aspirin 325 mg.   BIOFREEZE EX Apply 1 application topically daily. To shoulder and knees   citalopram 20 MG tablet Commonly known as:  CELEXA Take 20 mg by mouth daily.   cyanocobalamin 500 MCG tablet Take 500 mcg by mouth daily.   B-12 COMPLIANCE INJECTION IJ Inject as directed every 30 (thirty) days.   hydrochlorothiazide 25 MG tablet Commonly known as:  HYDRODIURIL Take 25 mg by mouth daily.   meloxicam 15 MG tablet Commonly known as:  MOBIC Take 15 mg by mouth daily.   methocarbamol 500 MG tablet Commonly known as:  ROBAXIN Take 1 tablet (500 mg total) by mouth every 6 (six) hours as needed for muscle spasms.   ondansetron 4 MG tablet Commonly known as:  ZOFRAN Take 1 tablet (4 mg total) by mouth every 8 (eight) hours as  needed for nausea or vomiting.   oxyCODONE-acetaminophen 5-325 MG tablet Commonly known as:  PERCOCET Take 1-2 tablets by mouth every 4 (four) hours as needed.   Vitamin D (Ergocalciferol) 50000 units Caps capsule Commonly known as:  DRISDOL Take 50,000 Units by mouth once a week.  Discharge Care Instructions        Start     Ordered   10/17/16 0000  oxyCODONE-acetaminophen (PERCOCET) 5-325 MG tablet  Every 4 hours PRN     10/17/16 0852   10/17/16 0000  ondansetron (ZOFRAN) 4 MG tablet  Every 8 hours PRN     10/17/16 0852      FOLLOW UP VISIT:   Follow-up Information    [name], MD.   Specialty:  Orthopedic Surgery Why:  call to be seen in 10-14 days Contact information: [address] [place] [address] [phone]           DISCHARGE TO: Home   DISCHARGE CONDITION:  Festus Barren for Dr. [name] 10/17/2016, 8:52 AM

## 2016-10-17 NOTE — Progress Notes (Signed)
Discharge teaching complete. Meds, diet, activity, incision care and follow up appointments reviewed and all questions answered. Copy of instructions and prescriptions given to patient.

## 2016-10-29 DIAGNOSIS — Z96611 Presence of right artificial shoulder joint: Secondary | ICD-10-CM | POA: Diagnosis not present

## 2016-10-29 DIAGNOSIS — Z471 Aftercare following joint replacement surgery: Secondary | ICD-10-CM | POA: Diagnosis not present

## 2016-10-31 ENCOUNTER — Telehealth: Payer: Self-pay

## 2016-10-31 NOTE — Telephone Encounter (Signed)
Thanks- I will be on the look out for the order

## 2016-10-31 NOTE — Telephone Encounter (Signed)
Pt left v/m as FYI that [facility] is going to fax order request for cpap to be put on auto at night; which means can be adjusted as needed. Last annual 03/05/16.

## 2016-11-07 DIAGNOSIS — G4733 Obstructive sleep apnea (adult) (pediatric): Secondary | ICD-10-CM | POA: Diagnosis not present

## 2016-11-11 DIAGNOSIS — Z96611 Presence of right artificial shoulder joint: Secondary | ICD-10-CM | POA: Diagnosis not present

## 2016-11-11 DIAGNOSIS — M25511 Pain in right shoulder: Secondary | ICD-10-CM | POA: Diagnosis not present

## 2016-11-13 DIAGNOSIS — K228 Other specified diseases of esophagus: Secondary | ICD-10-CM | POA: Diagnosis not present

## 2016-11-13 DIAGNOSIS — K449 Diaphragmatic hernia without obstruction or gangrene: Secondary | ICD-10-CM | POA: Diagnosis not present

## 2016-11-13 DIAGNOSIS — K219 Gastro-esophageal reflux disease without esophagitis: Secondary | ICD-10-CM | POA: Diagnosis not present

## 2016-11-14 DIAGNOSIS — M25511 Pain in right shoulder: Secondary | ICD-10-CM | POA: Diagnosis not present

## 2016-11-14 DIAGNOSIS — Z96611 Presence of right artificial shoulder joint: Secondary | ICD-10-CM | POA: Diagnosis not present

## 2016-11-18 DIAGNOSIS — M25511 Pain in right shoulder: Secondary | ICD-10-CM | POA: Diagnosis not present

## 2016-11-18 DIAGNOSIS — Z96611 Presence of right artificial shoulder joint: Secondary | ICD-10-CM | POA: Diagnosis not present

## 2016-11-20 DIAGNOSIS — Z96611 Presence of right artificial shoulder joint: Secondary | ICD-10-CM | POA: Diagnosis not present

## 2016-11-20 DIAGNOSIS — M25511 Pain in right shoulder: Secondary | ICD-10-CM | POA: Diagnosis not present

## 2016-11-25 DIAGNOSIS — Z96611 Presence of right artificial shoulder joint: Secondary | ICD-10-CM | POA: Diagnosis not present

## 2016-11-25 DIAGNOSIS — M25511 Pain in right shoulder: Secondary | ICD-10-CM | POA: Diagnosis not present

## 2016-11-27 DIAGNOSIS — M25511 Pain in right shoulder: Secondary | ICD-10-CM | POA: Diagnosis not present

## 2016-11-27 DIAGNOSIS — Z96611 Presence of right artificial shoulder joint: Secondary | ICD-10-CM | POA: Diagnosis not present

## 2016-11-28 DIAGNOSIS — Z0283 Encounter for blood-alcohol and blood-drug test: Secondary | ICD-10-CM | POA: Diagnosis not present

## 2016-11-28 DIAGNOSIS — E538 Deficiency of other specified B group vitamins: Secondary | ICD-10-CM | POA: Diagnosis not present

## 2016-11-28 DIAGNOSIS — Z6841 Body Mass Index (BMI) 40.0 and over, adult: Secondary | ICD-10-CM | POA: Diagnosis not present

## 2016-12-04 DIAGNOSIS — M25511 Pain in right shoulder: Secondary | ICD-10-CM | POA: Diagnosis not present

## 2016-12-04 DIAGNOSIS — Z96611 Presence of right artificial shoulder joint: Secondary | ICD-10-CM | POA: Diagnosis not present

## 2016-12-09 DIAGNOSIS — Z96611 Presence of right artificial shoulder joint: Secondary | ICD-10-CM | POA: Diagnosis not present

## 2016-12-09 DIAGNOSIS — M25511 Pain in right shoulder: Secondary | ICD-10-CM | POA: Diagnosis not present

## 2016-12-11 DIAGNOSIS — Z96611 Presence of right artificial shoulder joint: Secondary | ICD-10-CM | POA: Diagnosis not present

## 2016-12-11 DIAGNOSIS — M25511 Pain in right shoulder: Secondary | ICD-10-CM | POA: Diagnosis not present

## 2016-12-16 DIAGNOSIS — M25511 Pain in right shoulder: Secondary | ICD-10-CM | POA: Diagnosis not present

## 2016-12-16 DIAGNOSIS — Z96611 Presence of right artificial shoulder joint: Secondary | ICD-10-CM | POA: Diagnosis not present

## 2016-12-18 DIAGNOSIS — Z96611 Presence of right artificial shoulder joint: Secondary | ICD-10-CM | POA: Diagnosis not present

## 2016-12-18 DIAGNOSIS — M25511 Pain in right shoulder: Secondary | ICD-10-CM | POA: Diagnosis not present

## 2016-12-23 DIAGNOSIS — Z96611 Presence of right artificial shoulder joint: Secondary | ICD-10-CM | POA: Diagnosis not present

## 2016-12-23 DIAGNOSIS — M25511 Pain in right shoulder: Secondary | ICD-10-CM | POA: Diagnosis not present

## 2016-12-30 DIAGNOSIS — M25511 Pain in right shoulder: Secondary | ICD-10-CM | POA: Diagnosis not present

## 2016-12-30 DIAGNOSIS — Z6841 Body Mass Index (BMI) 40.0 and over, adult: Secondary | ICD-10-CM | POA: Diagnosis not present

## 2016-12-30 DIAGNOSIS — Z96611 Presence of right artificial shoulder joint: Secondary | ICD-10-CM | POA: Diagnosis not present

## 2017-01-01 DIAGNOSIS — M170 Bilateral primary osteoarthritis of knee: Secondary | ICD-10-CM | POA: Diagnosis not present

## 2017-01-01 DIAGNOSIS — M1711 Unilateral primary osteoarthritis, right knee: Secondary | ICD-10-CM | POA: Diagnosis not present

## 2017-01-01 DIAGNOSIS — M19011 Primary osteoarthritis, right shoulder: Secondary | ICD-10-CM | POA: Diagnosis not present

## 2017-01-01 DIAGNOSIS — M1712 Unilateral primary osteoarthritis, left knee: Secondary | ICD-10-CM | POA: Diagnosis not present

## 2017-01-02 DIAGNOSIS — Z96611 Presence of right artificial shoulder joint: Secondary | ICD-10-CM | POA: Diagnosis not present

## 2017-01-02 DIAGNOSIS — M25511 Pain in right shoulder: Secondary | ICD-10-CM | POA: Diagnosis not present

## 2017-01-06 DIAGNOSIS — Z96611 Presence of right artificial shoulder joint: Secondary | ICD-10-CM | POA: Diagnosis not present

## 2017-01-06 DIAGNOSIS — M25511 Pain in right shoulder: Secondary | ICD-10-CM | POA: Diagnosis not present

## 2017-01-07 DIAGNOSIS — Z96611 Presence of right artificial shoulder joint: Secondary | ICD-10-CM | POA: Diagnosis not present

## 2017-01-07 DIAGNOSIS — Z471 Aftercare following joint replacement surgery: Secondary | ICD-10-CM | POA: Diagnosis not present

## 2017-01-08 DIAGNOSIS — M25511 Pain in right shoulder: Secondary | ICD-10-CM | POA: Diagnosis not present

## 2017-01-08 DIAGNOSIS — Z96611 Presence of right artificial shoulder joint: Secondary | ICD-10-CM | POA: Diagnosis not present

## 2017-01-14 DIAGNOSIS — K9189 Other postprocedural complications and disorders of digestive system: Secondary | ICD-10-CM | POA: Diagnosis not present

## 2017-01-14 DIAGNOSIS — K9589 Other complications of other bariatric procedure: Secondary | ICD-10-CM | POA: Diagnosis not present

## 2017-01-14 DIAGNOSIS — K210 Gastro-esophageal reflux disease with esophagitis: Secondary | ICD-10-CM | POA: Diagnosis not present

## 2017-01-14 DIAGNOSIS — M419 Scoliosis, unspecified: Secondary | ICD-10-CM | POA: Diagnosis not present

## 2017-01-14 DIAGNOSIS — Z9884 Bariatric surgery status: Secondary | ICD-10-CM | POA: Diagnosis not present

## 2017-01-14 DIAGNOSIS — Z4682 Encounter for fitting and adjustment of non-vascular catheter: Secondary | ICD-10-CM | POA: Diagnosis not present

## 2017-01-14 DIAGNOSIS — R918 Other nonspecific abnormal finding of lung field: Secondary | ICD-10-CM | POA: Diagnosis not present

## 2017-01-14 DIAGNOSIS — K449 Diaphragmatic hernia without obstruction or gangrene: Secondary | ICD-10-CM | POA: Diagnosis not present

## 2017-01-14 DIAGNOSIS — G4733 Obstructive sleep apnea (adult) (pediatric): Secondary | ICD-10-CM | POA: Diagnosis not present

## 2017-01-14 DIAGNOSIS — R339 Retention of urine, unspecified: Secondary | ICD-10-CM | POA: Diagnosis not present

## 2017-01-14 DIAGNOSIS — T797XXA Traumatic subcutaneous emphysema, initial encounter: Secondary | ICD-10-CM | POA: Diagnosis not present

## 2017-01-14 DIAGNOSIS — I878 Other specified disorders of veins: Secondary | ICD-10-CM | POA: Diagnosis not present

## 2017-01-14 DIAGNOSIS — M1990 Unspecified osteoarthritis, unspecified site: Secondary | ICD-10-CM | POA: Diagnosis not present

## 2017-01-14 DIAGNOSIS — F329 Major depressive disorder, single episode, unspecified: Secondary | ICD-10-CM | POA: Diagnosis not present

## 2017-01-14 DIAGNOSIS — K3189 Other diseases of stomach and duodenum: Secondary | ICD-10-CM | POA: Diagnosis not present

## 2017-01-14 DIAGNOSIS — Z903 Acquired absence of stomach [part of]: Secondary | ICD-10-CM

## 2017-01-14 DIAGNOSIS — R1310 Dysphagia, unspecified: Secondary | ICD-10-CM | POA: Diagnosis not present

## 2017-01-14 DIAGNOSIS — Z6841 Body Mass Index (BMI) 40.0 and over, adult: Secondary | ICD-10-CM | POA: Diagnosis not present

## 2017-01-14 DIAGNOSIS — R7303 Prediabetes: Secondary | ICD-10-CM | POA: Diagnosis not present

## 2017-01-14 DIAGNOSIS — I10 Essential (primary) hypertension: Secondary | ICD-10-CM | POA: Diagnosis not present

## 2017-01-14 DIAGNOSIS — J90 Pleural effusion, not elsewhere classified: Secondary | ICD-10-CM | POA: Diagnosis not present

## 2017-01-14 DIAGNOSIS — K311 Adult hypertrophic pyloric stenosis: Secondary | ICD-10-CM | POA: Diagnosis not present

## 2017-01-14 DIAGNOSIS — K2930 Chronic superficial gastritis without bleeding: Secondary | ICD-10-CM | POA: Diagnosis not present

## 2017-01-14 HISTORY — DX: Acquired absence of stomach (part of): Z90.3

## 2017-01-14 HISTORY — PX: LAPAROSCOPIC GASTRIC SLEEVE RESECTION: SHX5895

## 2017-01-19 HISTORY — PX: DIAGNOSTIC LAPAROSCOPY: SUR761

## 2017-01-20 MED ORDER — KCL IN DEXTROSE-NACL 20-5-0.45 MEQ/L-%-% IV SOLN
75.00 | INTRAVENOUS | Status: DC
Start: ? — End: 2017-01-20

## 2017-01-20 MED ORDER — GENERIC EXTERNAL MEDICATION
Status: DC
Start: ? — End: 2017-01-20

## 2017-01-20 MED ORDER — ACETAMINOPHEN 10 MG/ML IV SOLN
1000.00 mg | INTRAVENOUS | Status: DC
Start: 2017-01-21 — End: 2017-01-20

## 2017-01-20 MED ORDER — OXYCODONE HCL 5 MG/5ML PO SOLN
5.00 mg | ORAL | Status: DC
Start: ? — End: 2017-01-20

## 2017-01-20 MED ORDER — DEXTROSE 50 % IV SOLN
12.50 | INTRAVENOUS | Status: DC
Start: ? — End: 2017-01-20

## 2017-01-20 MED ORDER — BISACODYL 10 MG RE SUPP
10.00 mg | RECTAL | Status: DC
Start: 2017-01-21 — End: 2017-01-20

## 2017-01-20 MED ORDER — HEPARIN SODIUM (PORCINE) 10000 UNIT/ML IJ SOLN
7500.00 | INTRAMUSCULAR | Status: DC
Start: 2017-01-20 — End: 2017-01-20

## 2017-01-20 MED ORDER — PHENOL 1.4 % MT LIQD
2.00 | OROMUCOSAL | Status: DC
Start: ? — End: 2017-01-20

## 2017-01-20 MED ORDER — ONDANSETRON HCL 4 MG/2ML IJ SOLN
4.00 mg | INTRAMUSCULAR | Status: DC
Start: 2017-01-20 — End: 2017-01-20

## 2017-01-20 MED ORDER — MAGNESIUM HYDROXIDE 400 MG/5ML PO SUSP
5.00 | ORAL | Status: DC
Start: 2017-01-21 — End: 2017-01-20

## 2017-01-20 MED ORDER — LIDOCAINE HCL 1 % IJ SOLN
INTRAMUSCULAR | Status: DC
Start: ? — End: 2017-01-20

## 2017-01-20 MED ORDER — CITALOPRAM HYDROBROMIDE 20 MG PO TABS
20.00 mg | ORAL_TABLET | ORAL | Status: DC
Start: 2017-01-21 — End: 2017-01-20

## 2017-01-20 MED ORDER — BUPIVACAINE HCL (PF) 0.25 % IJ SOLN
INTRAMUSCULAR | Status: DC
Start: ? — End: 2017-01-20

## 2017-01-20 MED ORDER — NALOXONE HCL 0.4 MG/ML IJ SOLN
0.40 mg | INTRAMUSCULAR | Status: DC
Start: ? — End: 2017-01-20

## 2017-02-16 DIAGNOSIS — G4733 Obstructive sleep apnea (adult) (pediatric): Secondary | ICD-10-CM | POA: Diagnosis not present

## 2017-03-06 ENCOUNTER — Encounter: Payer: Self-pay | Admitting: Family Medicine

## 2017-03-06 ENCOUNTER — Ambulatory Visit (INDEPENDENT_AMBULATORY_CARE_PROVIDER_SITE_OTHER): Payer: BLUE CROSS/BLUE SHIELD | Admitting: Family Medicine

## 2017-03-06 VITALS — BP 112/68 | HR 73 | Temp 98.5°F | Ht 65.0 in | Wt 241.2 lb

## 2017-03-06 DIAGNOSIS — M25561 Pain in right knee: Secondary | ICD-10-CM | POA: Diagnosis not present

## 2017-03-06 DIAGNOSIS — E559 Vitamin D deficiency, unspecified: Secondary | ICD-10-CM | POA: Diagnosis not present

## 2017-03-06 DIAGNOSIS — M25562 Pain in left knee: Secondary | ICD-10-CM

## 2017-03-06 DIAGNOSIS — M25869 Other specified joint disorders, unspecified knee: Secondary | ICD-10-CM

## 2017-03-06 DIAGNOSIS — Z96659 Presence of unspecified artificial knee joint: Secondary | ICD-10-CM

## 2017-03-06 DIAGNOSIS — Z0000 Encounter for general adult medical examination without abnormal findings: Secondary | ICD-10-CM

## 2017-03-06 DIAGNOSIS — Z9884 Bariatric surgery status: Secondary | ICD-10-CM | POA: Diagnosis not present

## 2017-03-06 DIAGNOSIS — Z23 Encounter for immunization: Secondary | ICD-10-CM

## 2017-03-06 DIAGNOSIS — I10 Essential (primary) hypertension: Secondary | ICD-10-CM | POA: Diagnosis not present

## 2017-03-06 DIAGNOSIS — G8929 Other chronic pain: Secondary | ICD-10-CM | POA: Diagnosis not present

## 2017-03-06 LAB — CBC WITH DIFFERENTIAL/PLATELET
BASOS ABS: 0.1 10*3/uL (ref 0.0–0.1)
Basophils Relative: 0.9 % (ref 0.0–3.0)
Eosinophils Absolute: 0.2 10*3/uL (ref 0.0–0.7)
Eosinophils Relative: 2.6 % (ref 0.0–5.0)
HCT: 39.3 % (ref 36.0–46.0)
HEMOGLOBIN: 12.9 g/dL (ref 12.0–15.0)
LYMPHS PCT: 24.8 % (ref 12.0–46.0)
Lymphs Abs: 1.8 10*3/uL (ref 0.7–4.0)
MCHC: 32.7 g/dL (ref 30.0–36.0)
MCV: 83.9 fl (ref 78.0–100.0)
Monocytes Absolute: 0.7 10*3/uL (ref 0.1–1.0)
Monocytes Relative: 9 % (ref 3.0–12.0)
Neutro Abs: 4.6 10*3/uL (ref 1.4–7.7)
Neutrophils Relative %: 62.7 % (ref 43.0–77.0)
Platelets: 313 10*3/uL (ref 150.0–400.0)
RBC: 4.68 Mil/uL (ref 3.87–5.11)
RDW: 15 % (ref 11.5–15.5)
WBC: 7.4 10*3/uL (ref 4.0–10.5)

## 2017-03-06 LAB — COMPREHENSIVE METABOLIC PANEL
ALBUMIN: 4.1 g/dL (ref 3.5–5.2)
ALT: 17 U/L (ref 0–35)
AST: 18 U/L (ref 0–37)
Alkaline Phosphatase: 77 U/L (ref 39–117)
BILIRUBIN TOTAL: 0.5 mg/dL (ref 0.2–1.2)
BUN: 24 mg/dL — ABNORMAL HIGH (ref 6–23)
CALCIUM: 9.5 mg/dL (ref 8.4–10.5)
CO2: 30 mEq/L (ref 19–32)
CREATININE: 0.6 mg/dL (ref 0.40–1.20)
Chloride: 103 mEq/L (ref 96–112)
GFR: 107.28 mL/min (ref >60.00)
Glucose, Bld: 105 mg/dL — ABNORMAL HIGH (ref 70–99)
Potassium: 4.1 mEq/L (ref 3.5–5.1)
Sodium: 141 mEq/L (ref 135–145)
Total Protein: 7.4 g/dL (ref 6.0–8.3)

## 2017-03-06 LAB — LIPID PANEL
CHOL/HDL RATIO: 4
Cholesterol: 168 mg/dL (ref 0–200)
HDL: 40.1 mg/dL (ref >39.00)
LDL Cholesterol: 107 mg/dL — ABNORMAL HIGH (ref 0–99)
NONHDL: 127.42
TRIGLYCERIDES: 102 mg/dL (ref 0.0–149.0)
VLDL: 20.4 mg/dL (ref 0.0–40.0)

## 2017-03-06 LAB — VITAMIN B12: Vitamin B-12: 793 pg/mL (ref 211–911)

## 2017-03-06 LAB — TSH: TSH: 2.09 u[IU]/mL (ref 0.35–4.50)

## 2017-03-06 LAB — VITAMIN D 25 HYDROXY (VIT D DEFICIENCY, FRACTURES): VITD: 38.52 ng/mL (ref 30.00–100.00)

## 2017-03-06 NOTE — Patient Instructions (Signed)
Tdap vaccine today  Labs today  Keep working on healthy diet and exercise   Keep up the good work

## 2017-03-06 NOTE — Progress Notes (Signed)
Subjective:    Patient ID: Katrina Huffman, female    DOB: 1955-02-16, 62 y.o.   MRN: [id number]  HPI Here for health maintenance exam and to review chronic medical problems    Had revision of her knee surgery-not been right since (will clean it up)- a clunk- in march For R knee repl in march   Had shoulder replacement in sept R shoulder -in the fall  Very happy with it    Wt Readings from Last 3 Encounters:  03/06/17 241 lb 4 oz (109.4 kg)  10/13/16 272 lb 3 oz (123.5 kg)  04/16/16 277 lb (125.6 kg)  bariatric surgery 7 weeks ago  Feeling good overall- she is getting used to her new normal- eating for need/ not for enjoyment  Almost 50 lb lost so far  Exercising- using the gym at lunch every day /rides the bike  40.15 kg/m   Tetanus shot 4/08  Mammogram 7/18 -per pt  Self breast exam - no lumps   Colonoscopy 3/17 polyp with 3 y recall   Pap 7/18 did at gyn She sees gyn in July Will also get a bone density   Flu shot 10/18  shingrix 5/18- has had both shots   bp is stable today (with no medication)  No cp or palpitations or headaches or edema  No side effects to medicines  BP Readings from Last 3 Encounters:  03/06/17 112/68  10/17/16 (!) 111/57  10/13/16 136/63     Pt had bariatric surgery- gastric sleeve (had to do a revision for filling issues)  Still a little sore over incisions -still healing   Due for labs- will do them  Needs B12 shot -s/p bariatric surg (had B12 shots before surgery)   Lab Results  Component Value Date   CHOL 175 03/05/2016   HDL 42.60 03/05/2016   LDLCALC 114 (H) 03/05/2016   TRIG 94.0 03/05/2016   CHOLHDL 4 03/05/2016   Mood has been ok   Patient Active Problem List   Diagnosis Date Noted  . Bariatric surgery status 03/06/2017  . S/P shoulder replacement 10/16/2016  . Esophagitis 10/02/2016  . Failed total knee arthroplasty, 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  . Failed total knee arthroplasty ([place]) 04/16/2016  . Bilateral chronic  knee pain 03/20/2016  . Vitamin D deficiency 03/05/2016  . Anxiety disorder 03/05/2016  . Pre-operative clearance 03/05/2016  . Essential hypertension 03/05/2016  . 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 Special screening for malignant neoplasms, colon   . Benign neoplasm of transverse colon   . Screening for lipoid disorders 06/18/2010  . Routine general medical examination at a health care facility 06/18/2010  . Morbid obesity ([place]) 06/18/2010   Past Medical History:  Diagnosis Date  . Anxiety   . Arthritis    hands, knees, back  . Depression   . Edema    left leg knee to foot, since veein stripping  . History of chicken pox   . History of hiatal hernia   . Hypertension   . PONV (postoperative nausea and vomiting)    PONV after knee arthroplasty  . Scoliosis    sees chiropracter monthly  . Shortness of breath dyspnea    "out of shape"  . Sleep apnea    cpap   Past Surgical History:  Procedure Laterality Date  . BREAST SURGERY  1999   breast biopsy  . BUNIONECTOMY  2000  . [place]   . COLONOSCOPY    .  COLONOSCOPY WITH PROPOFOL N/A 04/30/2015   Procedure: COLONOSCOPY WITH PROPOFOL;  Surgeon: [name], MD;  Location: [facility];  Service: Endoscopy;  Laterality: N/A;  . DILATION AND CURETTAGE OF UTERUS     x2 last one 1/17  . JOINT REPLACEMENT     LTKA-3 yrs ago  . KNEE ARTHROSCOPY Left 05/09/2015   Procedure: ARTHROSCOPY LEFT KNEE WITH SYNOVECTOMY;  Surgeon: [name], MD;  Location: [facility];  Service: Orthopedics;  Laterality: Left;  . KNEE SURGERY Left 2009   x 2-meniscus, debridement  . LASIK    . POLYPECTOMY  04/30/2015   Procedure: POLYPECTOMY;  Surgeon: [name], MD;  Location: [facility];  Service: Endoscopy;;  . TONSILLECTOMY AND ADENOIDECTOMY  2007  . TOTAL KNEE ARTHROPLASTY WITH REVISION COMPONENTS Left 04/16/2016   Procedure: LEFT TOTAL KNEE ARTHROPLASTY WITH POLY REVISION;   Surgeon: [name], MD;  Location: [facility];  Service: Orthopedics;  Laterality: Left;  with abductor block  . TOTAL SHOULDER ARTHROPLASTY Right 10/16/2016  . TOTAL SHOULDER ARTHROPLASTY Right 10/16/2016   Procedure: TOTAL SHOULDER ARTHROPLASTY;  Surgeon: [name], MD;  Location: [facility];  Service: Orthopedics;  Laterality: Right;  . [place]  . VEIN LIGATION AND STRIPPING  2000   Social History   Tobacco Use  . Smoking status: Never Smoker  . Smokeless tobacco: Never Used  Substance Use Topics  . Alcohol use: No    Comment: rare  . Drug use: No   Family History  Problem Relation Age of Onset  . Arthritis Father   . Cancer Father        prostate CA  . Hypertension Father   . Diabetes Brother   . Arthritis Mother   . Hypertension Mother   . Cancer Paternal Aunt        breast cancer  . Arthritis Maternal Grandmother   . Hypertension Maternal Grandmother   . Arthritis Maternal Grandfather   . Hypertension Maternal Grandfather   . Diabetes Maternal Grandfather    No Known Allergies Current Outpatient Medications on File Prior to Visit  Medication Sig Dispense Refill  . ALPRAZolam (XANAX) 0.5 MG tablet Take 0.5 mg by mouth daily as needed for anxiety.    Marland Kitchen amoxicillin (AMOXIL) 500 MG tablet Take 2,000 mg by mouth See admin instructions. Takes 4 tablets 1 hour prior to dental procedures    . citalopram (CELEXA) 20 MG tablet Take 20 mg by mouth daily.      . meloxicam (MOBIC) 15 MG tablet Take 15 mg by mouth daily.    . Menthol, Topical Analgesic, (BIOFREEZE EX) Apply 1 application topically daily. To shoulder and knees    . methocarbamol (ROBAXIN) 500 MG tablet Take 1 tablet (500 mg total) by mouth every 6 (six) hours as needed for muscle spasms. 80 tablet 0  . NON FORMULARY bariatric vitamins     No current facility-administered medications on file prior to visit.     Review of Systems  Constitutional: Negative for activity change, appetite change,  fatigue, fever and unexpected weight change.  HENT: Negative for congestion, ear pain, rhinorrhea, sinus pressure and sore throat.   Eyes: Negative for pain, redness and visual disturbance.  Respiratory: Negative for cough, shortness of breath and wheezing.   Cardiovascular: Negative for chest pain and palpitations.  Gastrointestinal: Negative for abdominal pain, blood in stool, constipation and diarrhea.  Endocrine: Negative for polydipsia and polyuria.  Genitourinary: Negative for dysuria, frequency and urgency.  Musculoskeletal: Positive for arthralgias. Negative for back pain and myalgias.       Bilateral knee pain   Skin: Negative for pallor and rash.  Allergic/Immunologic: Negative for environmental allergies.  Neurological: Negative for dizziness, syncope and headaches.  Hematological: Negative for adenopathy. Does not bruise/bleed easily.  Psychiatric/Behavioral: Negative for decreased concentration and dysphoric mood. The patient is not nervous/anxious.        Objective:   Physical Exam  Constitutional: She appears well-developed and well-nourished. No distress.  Morbidly obese and well appearing  Wt loss noted however  HENT:  Head: Normocephalic and atraumatic.  Right Ear: External ear normal.  Left Ear: External ear normal.  Nose: Nose normal.  Mouth/Throat: Oropharynx is clear and moist.  Eyes: Conjunctivae and EOM are normal. Pupils are equal, round, and reactive to light. Right eye exhibits no discharge. Left eye exhibits no discharge. No scleral icterus.  Neck: Normal range of motion. Neck supple. No JVD present. Carotid bruit is not present. No thyromegaly present.  Cardiovascular: Normal rate, regular rhythm, normal heart sounds and intact distal pulses. Exam reveals no gallop.  Pulmonary/Chest: Effort normal and breath sounds normal. No respiratory distress. She has no wheezes. She has no rales.  Abdominal: Soft. Bowel sounds are normal. She exhibits no distension  and no mass. There is no tenderness.  Genitourinary:  Genitourinary Comments: Gyn does her breast/pelvic exam  Musculoskeletal: She exhibits no edema or tenderness.  Limited rom both knees Some difficulty maneuvering step for table   Lymphadenopathy:    She has no cervical adenopathy.  Neurological: She is alert. She has normal reflexes. No cranial nerve deficit. She exhibits normal muscle tone. Coordination normal.  Skin: Skin is warm and dry. No rash noted. No erythema. No pallor.  Solar lentigines diffusely   Psychiatric: She has a normal mood and affect.  Cheerful           Assessment & Plan:   Problem List Items Addressed This Visit      Cardiovascular and Mediastinum   Essential hypertension    bp in fair control at this time  BP Readings from Last 1 Encounters:  03/06/17 112/68   No changes needed Disc lifstyle change with low sodium diet and exercise  Labs reviewed         Other   Bariatric surgery status    Doing well with almost 50 lb wt loss so far  Lab today incl vit D and B12 levels  Enc continued better eating and exercise       Relevant Orders   Vitamin B12 (Completed)   Bilateral chronic knee pain    For R knee replacement in march No medical restrictions  Has done well in the past        Morbid obesity ([place])    Almost 50 lb loss since her bariatric surgery  Enc her to keep up the good work Feels better      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    Planning another revision of this      Routine general medical examination at a health care facility - Primary    Reviewed health habits including diet and exercise and skin cancer prevention Reviewed appropriate screening tests for age  Also reviewed health mt list, fam hx and immunization status , as well as social and family history   See HPI Labs today Commended wt loss so far  No restrictions for upcoming knee surgery  Tdap vaccine today  Relevant Orders     CBC with Differential/Platelet (Completed)   Comprehensive metabolic panel (Completed)   TSH (Completed)   Lipid panel (Completed)   Vitamin D deficiency    D level with labs today  Disc imp to bone and overall health      Relevant Orders   VITAMIN D 25 Hydroxy (Vit-D Deficiency, Fractures) (Completed)    Other Visit Diagnoses    Need for Tdap vaccination       Relevant Orders   Tdap vaccine greater than or equal to 7yo IM (Completed)

## 2017-03-08 NOTE — Assessment & Plan Note (Signed)
For R knee replacement in march No medical restrictions  Has done well in the past

## 2017-03-08 NOTE — Assessment & Plan Note (Signed)
bp in fair control at this time  BP Readings from Last 1 Encounters:  03/06/17 112/68   No changes needed Disc lifstyle change with low sodium diet and exercise  Labs reviewed

## 2017-03-08 NOTE — Assessment & Plan Note (Signed)
D level with labs today Disc imp to bone and overall health  

## 2017-03-08 NOTE — Assessment & Plan Note (Signed)
Planning another revision of this

## 2017-03-08 NOTE — Assessment & Plan Note (Signed)
Almost 50 lb loss since her bariatric surgery  Enc her to keep up the good work [employer] better

## 2017-03-08 NOTE — Assessment & Plan Note (Signed)
Reviewed health habits including diet and exercise and skin cancer prevention Reviewed appropriate screening tests for age  Also reviewed health mt list, fam hx and immunization status , as well as social and family history   See HPI Labs today Commended wt loss so far  No restrictions for upcoming knee surgery  Tdap vaccine today

## 2017-03-08 NOTE — Assessment & Plan Note (Signed)
Doing well with almost 50 lb wt loss so far  Lab today incl vit D and B12 levels  Enc continued better eating and exercise

## 2017-03-13 DIAGNOSIS — I10 Essential (primary) hypertension: Secondary | ICD-10-CM | POA: Diagnosis not present

## 2017-03-13 DIAGNOSIS — F329 Major depressive disorder, single episode, unspecified: Secondary | ICD-10-CM | POA: Diagnosis not present

## 2017-03-13 DIAGNOSIS — Z9884 Bariatric surgery status: Secondary | ICD-10-CM | POA: Diagnosis not present

## 2017-03-25 NOTE — Progress Notes (Signed)
Please place orders in Epic as patient is being scheduled for a pre-op appointment! Thank you! 

## 2017-03-29 ENCOUNTER — Ambulatory Visit: Payer: Self-pay | Admitting: Orthopedic Surgery

## 2017-04-15 DIAGNOSIS — E669 Obesity, unspecified: Secondary | ICD-10-CM | POA: Diagnosis not present

## 2017-04-15 DIAGNOSIS — Z9989 Dependence on other enabling machines and devices: Secondary | ICD-10-CM | POA: Diagnosis not present

## 2017-04-15 DIAGNOSIS — Z9884 Bariatric surgery status: Secondary | ICD-10-CM | POA: Diagnosis not present

## 2017-04-15 DIAGNOSIS — G4733 Obstructive sleep apnea (adult) (pediatric): Secondary | ICD-10-CM | POA: Diagnosis not present

## 2017-04-15 DIAGNOSIS — I10 Essential (primary) hypertension: Secondary | ICD-10-CM | POA: Diagnosis not present

## 2017-04-15 DIAGNOSIS — Z1321 Encounter for screening for nutritional disorder: Secondary | ICD-10-CM | POA: Diagnosis not present

## 2017-04-17 NOTE — Progress Notes (Addendum)
01/21/2017- Clearance on chart from Dr. [name]  01/15/2017- noted in [place] reading with no tracing  03/06/2017- office note on chart from Dr. [name]

## 2017-04-17 NOTE — Patient Instructions (Addendum)
Katrina Huffman  04/17/2017   Your procedure is scheduled on: Monday 04/27/2017   Report to [facility] Main  Entrance              Report to admitting at  1220  PM    Call this number if you have problems the morning of surgery [phone]               PLEASE BRING CPAP MASK AND TUBING [place]!   Remember: Do not eat food  :After Midnight. May have clear liquids from midnight up until 0850 am then nothing until after surgery!     CLEAR LIQUID DIET   Foods Allowed                                                                     Foods Excluded  Coffee and tea, regular and decaf                             liquids that you cannot  Plain Jell-O in any flavor                                             see through such as: Fruit ices (not with fruit pulp)                                     milk, soups, orange juice  Iced Popsicles                                    All solid food Carbonated beverages, regular and diet                                    Cranberry, grape and apple juices Sports drinks like Gatorade Lightly seasoned clear broth or consume(fat free) Sugar, honey syrup  Sample Menu Breakfast                                Lunch                                     Supper Cranberry juice                    Beef broth                            Chicken broth Jell-O  Grape juice                           Apple juice Coffee or tea                        Jell-O                                      Popsicle                                                Coffee or tea                        Coffee or tea  _____________________________________________________________________     Take these medicines the morning of surgery with A SIP OF WATER: Citalopram (Celexa)                                You may not have any metal on your body including hair pins and               piercings  Do not wear jewelry, make-up, lotions, powders or perfumes, deodorant             Do not wear nail polish.  Do not shave  48 hours prior to surgery.              Men may shave face and neck.   Do not bring valuables to the hospital. [place].  Contacts, dentures or bridgework may not be worn into surgery.  Leave suitcase in the car. After surgery it may be brought to your room.               Please read over the following fact sheets you were given: _____________________________________________________________________             [facility] - Preparing for Surgery Before surgery, you can play an important role.  Because skin is not sterile, your skin needs to be as free of germs as possible.  You can reduce the number of germs on your skin by washing with CHG (chlorahexidine gluconate) soap before surgery.  CHG is an antiseptic cleaner which kills germs and bonds with the skin to continue killing germs even after washing. Please DO NOT use if you have an allergy to CHG or antibacterial soaps.  If your skin becomes reddened/irritated stop using the CHG and inform your nurse when you arrive at Short Stay. Do not shave (including legs and underarms) for at least 48 hours prior to the first CHG shower.  You may shave your face/neck. Please follow these instructions carefully:  1.  Shower with CHG Soap the night before surgery and the  morning of Surgery.  2.  If you choose to wash your hair, wash your hair first as usual with your  normal  shampoo.  3.  After you shampoo, rinse your hair and body thoroughly to remove the  shampoo.  4.  Use CHG as you would any other liquid soap.  You can apply chg directly  to the skin and wash                       Gently with a scrungie or clean washcloth.  5.  Apply the CHG Soap to your body ONLY FROM THE NECK DOWN.   Do not use on face/ open                            Wound or open sores. Avoid contact with eyes, ears mouth and genitals (private parts).                       Wash face,  Genitals (private parts) with your normal soap.             6.  Wash thoroughly, paying special attention to the area where your surgery  will be performed.  7.  Thoroughly rinse your body with warm water from the neck down.  8.  DO NOT shower/wash with your normal soap after using and rinsing off  the CHG Soap.                9.  Pat yourself dry with a clean towel.            10.  Wear clean pajamas.            11.  Place clean sheets on your bed the night of your first shower and do not  sleep with pets. Day of Surgery : Do not apply any lotions/deodorants the morning of surgery.  Please wear clean clothes to the hospital/surgery center.  FAILURE TO FOLLOW THESE INSTRUCTIONS MAY RESULT IN THE CANCELLATION OF YOUR SURGERY PATIENT SIGNATURE_________________________________  NURSE SIGNATURE__________________________________  ________________________________________________________________________   Adam Phenix  An incentive spirometer is a tool that can help keep your lungs clear and active. This tool measures how well you are filling your lungs with each breath. Taking long deep breaths may help reverse or decrease the chance of developing breathing (pulmonary) problems (especially infection) following:  A long period of time when you are unable to move or be active. BEFORE THE PROCEDURE   If the spirometer includes an indicator to show your best effort, your nurse or respiratory therapist will set it to a desired goal.  If possible, sit up straight or lean slightly forward. Try not to slouch.  Hold the incentive spirometer in an upright position. INSTRUCTIONS FOR USE  1. Sit on the edge of your bed if possible, or sit up as far as you can in bed or on a chair. 2. Hold the incentive spirometer in an upright position. 3. Breathe out normally. 4. Place  the mouthpiece in your mouth and seal your lips tightly around it. 5. Breathe in slowly and as deeply as possible, raising the piston or the ball toward the top of the column. 6. Hold your breath for 3-5 seconds or for as long as possible. Allow the piston or ball to fall to the bottom of the column. 7. Remove the mouthpiece from your mouth and breathe out normally. 8. Rest for a few seconds and repeat Steps 1 through 7 at least 10 times every 1-2 hours when you are awake. Take your time and take a few normal breaths between deep breaths. 9. The spirometer may include an indicator to  show your best effort. Use the indicator as a goal to work toward during each repetition. 10. After each set of 10 deep breaths, practice coughing to be sure your lungs are clear. If you have an incision (the cut made at the time of surgery), support your incision when coughing by placing a pillow or rolled up towels firmly against it. Once you are able to get out of bed, walk around indoors and cough well. You may stop using the incentive spirometer when instructed by your caregiver.  RISKS AND COMPLICATIONS  Take your time so you do not get dizzy or light-headed.  If you are in pain, you may need to take or ask for pain medication before doing incentive spirometry. It is harder to take a deep breath if you are having pain. AFTER USE  Rest and breathe slowly and easily.  It can be helpful to keep track of a log of your progress. Your caregiver can provide you with a simple table to help with this. If you are using the spirometer at home, follow these instructions: Center IF:   You are having difficultly using the spirometer.  You have trouble using the spirometer as often as instructed.  Your pain medication is not giving enough relief while using the spirometer.  You develop fever of 100.5 F (38.1 C) or higher. SEEK IMMEDIATE MEDICAL CARE IF:   You cough up bloody sputum that had not been  present before.  You develop fever of 102 F (38.9 C) or greater.  You develop worsening pain at or near the incision site. MAKE SURE YOU:   Understand these instructions.  Will watch your condition.  Will get help right away if you are not doing well or get worse. Document Released: 06/02/2006 Document Revised: 04/14/2011 Document Reviewed: 08/03/2006 ExitCare Patient Information 2014 Exit[employer], [place].   ________________________________________________________________________  WHAT IS A BLOOD TRANSFUSION? Blood Transfusion Information  A transfusion is the replacement of blood or some of its parts. Blood is made up of multiple cells which provide different functions.  Red blood cells carry oxygen and are used for blood loss replacement.  White blood cells fight against infection.  Platelets control bleeding.  Plasma helps clot blood.  Other blood products are available for specialized needs, such as hemophilia or other clotting disorders. BEFORE THE TRANSFUSION  Who gives blood for transfusions?   Healthy volunteers who are fully evaluated to make sure their blood is safe. This is blood bank blood. Transfusion therapy is the safest it has ever been in the practice of medicine. Before blood is taken from a donor, a complete history is taken to make sure that person has no history of diseases nor engages in risky social behavior (examples are intravenous drug use or sexual activity with multiple partners). The donor's travel history is screened to minimize risk of transmitting infections, such as malaria. The donated blood is tested for signs of infectious diseases, such as HIV and hepatitis. The blood is then tested to be sure it is compatible with you in order to minimize the chance of a transfusion reaction. If you or a relative donates blood, this is often done in anticipation of surgery and is not appropriate for emergency situations. It takes many days to process the donated  blood. RISKS AND COMPLICATIONS Although transfusion therapy is very safe and saves many lives, the main dangers of transfusion include:   Getting an infectious disease.  Developing a transfusion reaction. This is an allergic reaction  to something in the blood you were given. Every precaution is taken to prevent this. The decision to have a blood transfusion has been considered carefully by your caregiver before blood is given. Blood is not given unless the benefits outweigh the risks. AFTER THE TRANSFUSION  Right after receiving a blood transfusion, you will usually feel much better and more energetic. This is especially true if your red blood cells have gotten low (anemic). The transfusion raises the level of the red blood cells which carry oxygen, and this usually causes an energy increase.  The nurse administering the transfusion will monitor you carefully for complications. HOME CARE INSTRUCTIONS  No special instructions are needed after a transfusion. You may find your energy is better. Speak with your caregiver about any limitations on activity for underlying diseases you may have. SEEK MEDICAL CARE IF:   Your condition is not improving after your transfusion.  You develop redness or irritation at the intravenous (IV) site. SEEK IMMEDIATE MEDICAL CARE IF:  Any of the following symptoms occur over the next 12 hours:  Shaking chills.  You have a temperature by mouth above 102 F (38.9 C), not controlled by medicine.  Chest, back, or muscle pain.  People around you feel you are not acting correctly or are confused.  Shortness of breath or difficulty breathing.  Dizziness and fainting.  You get a rash or develop hives.  You have a decrease in urine output.  Your urine turns a dark color or changes to pink, red, or brown. Any of the following symptoms occur over the next 10 days:  You have a temperature by mouth above 102 F (38.9 C), not controlled by  medicine.  Shortness of breath.  Weakness after normal activity.  The white part of the eye turns yellow (jaundice).  You have a decrease in the amount of urine or are urinating less often.  Your urine turns a dark color or changes to pink, red, or brown. Document Released: 01/18/2000 Document Revised: 04/14/2011 Document Reviewed: 09/06/2007 [facility] Patient Information 2014 [place], [place].  _______________________________________________________________________

## 2017-04-20 ENCOUNTER — Encounter (HOSPITAL_COMMUNITY): Payer: Self-pay

## 2017-04-20 ENCOUNTER — Encounter (HOSPITAL_COMMUNITY)
Admission: RE | Admit: 2017-04-20 | Discharge: 2017-04-20 | Disposition: A | Discharge status: Home / Self Care | Payer: BLUE CROSS/BLUE SHIELD | Source: Ambulatory Visit | Attending: Orthopedic Surgery | Admitting: Orthopedic Surgery

## 2017-04-20 ENCOUNTER — Other Ambulatory Visit: Payer: Self-pay

## 2017-04-20 ENCOUNTER — Encounter (INDEPENDENT_AMBULATORY_CARE_PROVIDER_SITE_OTHER): Payer: Self-pay

## 2017-04-20 DIAGNOSIS — Z01812 Encounter for preprocedural laboratory examination: Secondary | ICD-10-CM | POA: Diagnosis not present

## 2017-04-20 DIAGNOSIS — I10 Essential (primary) hypertension: Secondary | ICD-10-CM | POA: Diagnosis not present

## 2017-04-20 DIAGNOSIS — R001 Bradycardia, unspecified: Secondary | ICD-10-CM | POA: Diagnosis not present

## 2017-04-20 DIAGNOSIS — M65862 Other synovitis and tenosynovitis, left lower leg: Secondary | ICD-10-CM | POA: Diagnosis not present

## 2017-04-20 DIAGNOSIS — M1711 Unilateral primary osteoarthritis, right knee: Secondary | ICD-10-CM | POA: Diagnosis not present

## 2017-04-20 DIAGNOSIS — Z01810 Encounter for preprocedural cardiovascular examination: Secondary | ICD-10-CM | POA: Insufficient documentation

## 2017-04-20 LAB — COMPREHENSIVE METABOLIC PANEL
ALK PHOS: 85 U/L (ref 38–126)
ALT: 14 U/L (ref 14–54)
AST: 19 U/L (ref 15–41)
Albumin: 4 g/dL (ref 3.5–5.0)
Anion gap: 9 (ref 5–15)
BILIRUBIN TOTAL: 0.7 mg/dL (ref 0.3–1.2)
BUN: 30 mg/dL — ABNORMAL HIGH (ref 6–20)
CALCIUM: 9.1 mg/dL (ref 8.9–10.3)
CO2: 26 mmol/L (ref 22–32)
CREATININE: 0.52 mg/dL (ref 0.44–1.00)
Chloride: 105 mmol/L (ref 101–111)
GFR calc Af Amer: >60 mL/min (ref >60)
Glucose, Bld: 100 mg/dL — ABNORMAL HIGH (ref 65–99)
Potassium: 4.1 mmol/L (ref 3.5–5.1)
Sodium: 140 mmol/L (ref 135–145)
Total Protein: 6.9 g/dL (ref 6.5–8.1)

## 2017-04-20 LAB — PROTIME-INR
INR: 1.17
PROTHROMBIN TIME: 14.8 s (ref 11.4–15.2)

## 2017-04-20 LAB — CBC
HCT: 39.4 % (ref 36.0–46.0)
HEMOGLOBIN: 12.5 g/dL (ref 12.0–15.0)
MCH: 27.9 pg (ref 26.0–34.0)
MCHC: 31.7 g/dL (ref 30.0–36.0)
MCV: 87.9 fL (ref 78.0–100.0)
Platelets: 241 10*3/uL (ref 150–400)
RBC: 4.48 MIL/uL (ref 3.87–5.11)
RDW: 15.4 % (ref 11.5–15.5)
WBC: 6.1 10*3/uL (ref 4.0–10.5)

## 2017-04-20 LAB — APTT: aPTT: 33 seconds (ref 24–36)

## 2017-04-20 LAB — SURGICAL PCR SCREEN
MRSA, PCR: NEGATIVE
STAPHYLOCOCCUS AUREUS: NEGATIVE

## 2017-04-24 NOTE — Progress Notes (Signed)
Patient called and notified to arrive at Admitting day of surgery  04/27/2017  at 1115 am . Patient instructed that she may have clear liquids from midnight up until 0745 am then nothing until after surgery! Thank you!

## 2017-04-26 ENCOUNTER — Ambulatory Visit: Payer: Self-pay | Admitting: Orthopedic Surgery

## 2017-04-26 MED ORDER — BUPIVACAINE LIPOSOME 1.3 % IJ SUSP
20.0000 mL | Freq: Once | INTRAMUSCULAR | Status: DC
  Filled 2017-04-26: qty 20

## 2017-04-26 NOTE — H&P (Signed)
Katrina Huffman, Katrina Huffman (62yo, F) DOB December 03, 1955   Chief Complaint Right knee pain and left knee pain Plan - Right TKA and Left Knee Arthroscopy  Patient's Pharmacies [facility] [address] [facility]): [place], [place] [address], Ph [phone], Fax [phone]   Vitals Ht: 5 ft 5 in  Wt: 234 lbs  BMI: 38.9  BP: 138/68  Pulse: 68 bpm    Allergies Reviewed Allergies NKDA   Medications Reviewed Medications ALPRAZolam 0.5 mg tablet 08/16/16   filled PRIME citalopram 20 mg tablet 03/16/17   filled PRIME meloxicam 15 mg tablet 03/16/17   filled PRIME ursodiol 300 mg capsule 03/16/17   filled PRIME   Family History Reviewed Family History Mother - Arthritis   - Hypertensive disorder Father - Arthritis   - Hypertensive disorder   - History of carcinoma - prostate Brother - Diabetes mellitus Paternal Aunt - Malignant tumor of breast Maternal Grandmother - Hypertensive disorder Maternal Grandfather - Hypertensive disorder   - Diabetes mellitus grandfather --diabetes aunt -breast cancer   Social History Reviewed Social History Smoking Status: Never smoker Non-smoker Occupation: Primary school teacher Chewing tobacco: none Alcohol intake: Occasional Hand Dominance: Left Work related injury?: N Advance directive: N Freight forwarder of Attorney: N   Surgical History Reviewed Surgical History Excision of vein - Vein Stripping - 2000 Adenoid Surgery - 2007 Breast procedure - Biopsy - 1999 Knee Arthroscopy - 2009, April 2017 Tonsillectomy - 2007 Tubal ligation - 1986, 1991 Bariatric operative procedure - 03/06/2017 Shoulder Replacement - 10/16/2016 Diagnostic colonoscopy - 04/30/2015 - Polypectomy Bunionectomy - 1996 Caesarian section - 1985 Caesarian section - 1983   Past Medical History Reviewed Past Medical History Anxiety Disorder: Y Depression: Y Joint Pain: Y Weight loss: Y Notes: Esophagitis,  Essential Hypertension,  Edema,  History  of Chicken Pox,  Hiatal Hernia,  Postoperative Nausea/Vomiting,  Scoliosis,   HPI Patient is a 62 year old female scheduled for a right total knee and a left knee arthroscopy by Dr. Wynelle Link on 04/27/2017 at [facility]. The patient is being followed for their right knee pain and osteoarthritis. Symptoms reported include: pain and instability. She is having significant pain in that RIGHT knee which has progressed to end-stage arthritis. She has had injections in the past which have failed. The knee is having a very negative impact on her life and has been getting worse as time has gone on. She is now ready to have the right knee replaced at this time. Risks and benefits have been discussed and she is ready to proceed with surgery.  The patient had a left total knee has been replaced by Dr. Derald Macleod in [place] several years ago. She has had problems with that knee. She had developed painful popping in the knee. It especially bothers her when she gets up from a sitting position where she tries to do stairs. It was felt to be unstable following her original surgery and she had a polyethylene revision done in March of 2018. 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 Her LEFT knee is now more stable but due to the painful popping consistent with patellar clunk, it is felt that she will require debridement. She is now ready to proceed with an arthroscopy to be done in the same setting as the RIGHT total knee.   ROS Constitutional: Constitutional: no fever, chills, night sweats, or significant weight loss.  Cardiovascular: Cardiovascular: no palpitations or chest  pain.  Respiratory: Respiratory: no cough or shortness of breath and No COPD.  Gastrointestinal: Gastrointestinal: no vomiting or nausea.  Musculoskeletal: Musculoskeletal: no swelling in Joints and Joint Pain.  Neurologic: Neurologic: no numbness, tingling,  or difficulty with balance.   Physical Exam Patient is a 62 year old female.  General Mental Status - Alert, cooperative and good historian. General Appearance - pleasant, Not in acute distress. Orientation - Oriented X3. Build & Nutrition - Well nourished and Well developed.  Head and Neck Head - normocephalic, atraumatic . Neck Global Assessment - supple, no bruit auscultated on the right, no bruit auscultated on the left.  Eye Wears glasses Pupil - Bilateral - PERR Motion - Bilateral - EOMI.  Chest and Lung Exam Auscultation Breath sounds - clear at anterior chest wall and clear at posterior chest wall. Adventitious sounds - No Adventitious sounds.  Cardiovascular Auscultation Rhythm - Regular rate and rhythm. Heart Sounds - S1 WNL and S2 WNL. Murmurs & Other Heart Sounds - Auscultation of the heart reveals - No Murmurs.  Abdomen Palpation/Percussion Tenderness - Abdomen is non-tender to palpation. Abdomen is soft. Auscultation Auscultation of the abdomen reveals - Bowel sounds normal.  Genitourinary Note: Not done, not pertinent to present illness  Musculoskeletal RIGHT knee shows no effusion. There is marked crepitus on range of motion of the knee range being 10-125. She has a varus deformity. She is tender medial greater than lateral with no instability. LEFT knee shows no warmth or effusion. Her range is 0-125. There is crepitus when she goes from a flexed to an extended position consistent with patellar clunk. Her gait pattern is significantly antalgic.  Radiographs AP and lateral both knees show severe tricompartmental end-stage arthritis of the RIGHT knee bone-on-bone throughout. There were large osteophytes. LEFT knee prosthesis is in excellent position with no periprosthetic abnormalities.   Assessment / Plan Patient Instructions Surgical Plans: Right Total Knee Replacement and Left Knee Arthroscopy Disposition: Home, HHPT then to Outpatient at  [facility] in [place] PCP: Dr. [name] IV TXA Anesthesia Issues: Nausea and Vomiting after previous knee surgery. Patient was instructed on what medications to stop prior to surgery. - Follow up visit in 2 weeks with Dr. Wynelle Link - Begin physical therapy following surgery - Pre-operative lab work as pre Pre-Surgical Testing - Prescriptions will be provided in hospital at time of discharge  Return to [place], MD for [place] at [facility] on 05/12/2017 at 01:15 PM  Encounter signed-off by Mickel Crow, PA-C

## 2017-04-26 NOTE — H&P (View-Only) (Signed)
Katrina Huffman, Katrina Huffman (62yo, F) DOB 23-Jan-1956   Chief Complaint Right knee pain and left knee pain Plan - Right TKA and Left Knee Arthroscopy  Patient's Pharmacies [facility] [address] [facility]): [place], [place] [address], Ph [phone], Fax [phone]   Vitals Ht: 5 ft 5 in  Wt: 234 lbs  BMI: 38.9  BP: 138/68  Pulse: 68 bpm    Allergies Reviewed Allergies NKDA   Medications Reviewed Medications ALPRAZolam 0.5 mg tablet 08/16/16   filled PRIME citalopram 20 mg tablet 03/16/17   filled PRIME meloxicam 15 mg tablet 03/16/17   filled PRIME ursodiol 300 mg capsule 03/16/17   filled PRIME   Family History Reviewed Family History Mother - Arthritis   - Hypertensive disorder Father - Arthritis   - Hypertensive disorder   - History of carcinoma - prostate Brother - Diabetes mellitus Paternal Aunt - Malignant tumor of breast Maternal Grandmother - Hypertensive disorder Maternal Grandfather - Hypertensive disorder   - Diabetes mellitus grandfather --diabetes aunt -breast cancer   Social History Reviewed Social History Smoking Status: Never smoker Non-smoker Occupation: Primary school teacher Chewing tobacco: none Alcohol intake: Occasional Hand Dominance: Left Work related injury?: N Advance directive: N Freight forwarder of Attorney: N   Surgical History Reviewed Surgical History Excision of vein - Vein Stripping - 2000 Adenoid Surgery - 2007 Breast procedure - Biopsy - 1999 Knee Arthroscopy - 2009, April 2017 Tonsillectomy - 2007 Tubal ligation - 1986, 1991 Bariatric operative procedure - 03/06/2017 Shoulder Replacement - 10/16/2016 Diagnostic colonoscopy - 04/30/2015 - Polypectomy Bunionectomy - 1996 Caesarian section - 1985 Caesarian section - 1983   Past Medical History Reviewed Past Medical History Anxiety Disorder: Y Depression: Y Joint Pain: Y Weight loss: Y Notes: Esophagitis,  Essential Hypertension,  Edema,  History  of Chicken Pox,  Hiatal Hernia,  Postoperative Nausea/Vomiting,  Scoliosis,   HPI Patient is a 62 year old female scheduled for a right total knee and a left knee arthroscopy by Dr. Wynelle Link on 04/27/2017 at [facility]. The patient is being followed for their right knee pain and osteoarthritis. Symptoms reported include: pain and instability. She is having significant pain in that RIGHT knee which has progressed to end-stage arthritis. She has had injections in the past which have failed. The knee is having a very negative impact on her life and has been getting worse as time has gone on. She is now ready to have the right knee replaced at this time. Risks and benefits have been discussed and she is ready to proceed with surgery.  The patient had a left total knee has been replaced by Dr. Derald Macleod in [place] several years ago. She has had problems with that knee. She had developed painful popping in the knee. It especially bothers her when she gets up from a sitting position where she tries to do stairs. It was felt to be unstable following her original surgery and she had a polyethylene revision done in March of 2018. 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 Her LEFT knee is now more stable but due to the painful popping consistent with patellar clunk, it is felt that she will require debridement. She is now ready to proceed with an arthroscopy to be done in the same setting as the RIGHT total knee.   ROS Constitutional: Constitutional: no fever, chills, night sweats, or significant weight loss.  Cardiovascular: Cardiovascular: no palpitations or chest  pain.  Respiratory: Respiratory: no cough or shortness of breath and No COPD.  Gastrointestinal: Gastrointestinal: no vomiting or nausea.  Musculoskeletal: Musculoskeletal: no swelling in Joints and Joint Pain.  Neurologic: Neurologic: no numbness, tingling,  or difficulty with balance.   Physical Exam Patient is a 62 year old female.  General Mental Status - Alert, cooperative and good historian. General Appearance - pleasant, Not in acute distress. Orientation - Oriented X3. Build & Nutrition - Well nourished and Well developed.  Head and Neck Head - normocephalic, atraumatic . Neck Global Assessment - supple, no bruit auscultated on the right, no bruit auscultated on the left.  Eye Wears glasses Pupil - Bilateral - PERR Motion - Bilateral - EOMI.  Chest and Lung Exam Auscultation Breath sounds - clear at anterior chest wall and clear at posterior chest wall. Adventitious sounds - No Adventitious sounds.  Cardiovascular Auscultation Rhythm - Regular rate and rhythm. Heart Sounds - S1 WNL and S2 WNL. Murmurs & Other Heart Sounds - Auscultation of the heart reveals - No Murmurs.  Abdomen Palpation/Percussion Tenderness - Abdomen is non-tender to palpation. Abdomen is soft. Auscultation Auscultation of the abdomen reveals - Bowel sounds normal.  Genitourinary Note: Not done, not pertinent to present illness  Musculoskeletal RIGHT knee shows no effusion. There is marked crepitus on range of motion of the knee range being 10-125. She has a varus deformity. She is tender medial greater than lateral with no instability. LEFT knee shows no warmth or effusion. Her range is 0-125. There is crepitus when she goes from a flexed to an extended position consistent with patellar clunk. Her gait pattern is significantly antalgic.  Radiographs AP and lateral both knees show severe tricompartmental end-stage arthritis of the RIGHT knee bone-on-bone throughout. There were large osteophytes. LEFT knee prosthesis is in excellent position with no periprosthetic abnormalities.   Assessment / Plan Patient Instructions Surgical Plans: Right Total Knee Replacement and Left Knee Arthroscopy Disposition: Home, HHPT then to Outpatient at  [facility] [place] PCP: Dr. [name] IV TXA Anesthesia Issues: Nausea and Vomiting after previous knee surgery. Patient was instructed on what medications to stop prior to surgery. - Follow up visit in 2 weeks with Dr. Wynelle Link - Begin physical therapy following surgery - Pre-operative lab work as pre Pre-Surgical Testing - Prescriptions will be provided in hospital at time of discharge  Return to [place], MD for [place] at [facility] on 05/12/2017 at 01:15 PM  Encounter signed-off by Mickel Crow, PA-C

## 2017-04-27 ENCOUNTER — Encounter (HOSPITAL_COMMUNITY)
Admission: RE | Disposition: A | Discharge status: Home Health | Payer: Self-pay | Source: Ambulatory Visit | Attending: Orthopedic Surgery

## 2017-04-27 ENCOUNTER — Inpatient Hospital Stay (HOSPITAL_COMMUNITY): Payer: BLUE CROSS/BLUE SHIELD | Admitting: Certified Registered Nurse Anesthetist

## 2017-04-27 ENCOUNTER — Inpatient Hospital Stay (HOSPITAL_COMMUNITY)
Admission: RE | Admit: 2017-04-27 | Discharge: 2017-04-29 | DRG: 470 | Disposition: A | Discharge status: Home Health | Payer: BLUE CROSS/BLUE SHIELD | Source: Ambulatory Visit | Attending: Orthopedic Surgery | Admitting: Orthopedic Surgery

## 2017-04-27 ENCOUNTER — Other Ambulatory Visit: Payer: Self-pay

## 2017-04-27 ENCOUNTER — Encounter (HOSPITAL_COMMUNITY): Payer: Self-pay | Admitting: *Deleted

## 2017-04-27 DIAGNOSIS — M1710 Unilateral primary osteoarthritis, unspecified knee: Secondary | ICD-10-CM

## 2017-04-27 DIAGNOSIS — F419 Anxiety disorder, unspecified: Secondary | ICD-10-CM | POA: Diagnosis present

## 2017-04-27 DIAGNOSIS — M25761 Osteophyte, right knee: Secondary | ICD-10-CM | POA: Diagnosis not present

## 2017-04-27 DIAGNOSIS — Z8249 Family history of ischemic heart disease and other diseases of the circulatory system: Secondary | ICD-10-CM

## 2017-04-27 DIAGNOSIS — Z96619 Presence of unspecified artificial shoulder joint: Secondary | ICD-10-CM | POA: Diagnosis not present

## 2017-04-27 DIAGNOSIS — K30 Functional dyspepsia: Secondary | ICD-10-CM | POA: Diagnosis not present

## 2017-04-27 DIAGNOSIS — M419 Scoliosis, unspecified: Secondary | ICD-10-CM | POA: Diagnosis not present

## 2017-04-27 DIAGNOSIS — Z6838 Body mass index (BMI) 38.0-38.9, adult: Secondary | ICD-10-CM | POA: Diagnosis not present

## 2017-04-27 DIAGNOSIS — M25862 Other specified joint disorders, left knee: Secondary | ICD-10-CM | POA: Diagnosis not present

## 2017-04-27 DIAGNOSIS — M1711 Unilateral primary osteoarthritis, right knee: Principal | ICD-10-CM | POA: Diagnosis present

## 2017-04-27 DIAGNOSIS — G4730 Sleep apnea, unspecified: Secondary | ICD-10-CM | POA: Diagnosis not present

## 2017-04-27 DIAGNOSIS — E669 Obesity, unspecified: Secondary | ICD-10-CM | POA: Diagnosis not present

## 2017-04-27 DIAGNOSIS — I10 Essential (primary) hypertension: Secondary | ICD-10-CM | POA: Diagnosis present

## 2017-04-27 DIAGNOSIS — F329 Major depressive disorder, single episode, unspecified: Secondary | ICD-10-CM | POA: Diagnosis not present

## 2017-04-27 DIAGNOSIS — M25562 Pain in left knee: Secondary | ICD-10-CM | POA: Diagnosis not present

## 2017-04-27 DIAGNOSIS — Z96659 Presence of unspecified artificial knee joint: Secondary | ICD-10-CM

## 2017-04-27 DIAGNOSIS — Z833 Family history of diabetes mellitus: Secondary | ICD-10-CM | POA: Diagnosis not present

## 2017-04-27 DIAGNOSIS — M6752 Plica syndrome, left knee: Secondary | ICD-10-CM | POA: Diagnosis not present

## 2017-04-27 DIAGNOSIS — G8918 Other acute postprocedural pain: Secondary | ICD-10-CM | POA: Diagnosis not present

## 2017-04-27 DIAGNOSIS — M67262 Synovial hypertrophy, not elsewhere classified, left lower leg: Secondary | ICD-10-CM | POA: Diagnosis not present

## 2017-04-27 DIAGNOSIS — M25869 Other specified joint disorders, unspecified knee: Secondary | ICD-10-CM

## 2017-04-27 DIAGNOSIS — M179 Osteoarthritis of knee, unspecified: Secondary | ICD-10-CM | POA: Diagnosis present

## 2017-04-27 HISTORY — PX: TOTAL KNEE ARTHROPLASTY: SHX125

## 2017-04-27 HISTORY — PX: KNEE ARTHROSCOPY: SHX127

## 2017-04-27 LAB — TYPE AND SCREEN
ABO/RH(D): O POS
Antibody Screen: NEGATIVE

## 2017-04-27 SURGERY — ARTHROPLASTY, KNEE, TOTAL
Anesthesia: General | Site: Knee | Laterality: Right

## 2017-04-27 MED ORDER — BUPIVACAINE HCL (PF) 0.25 % IJ SOLN
INTRAMUSCULAR | Status: DC | PRN
  Administered 2017-04-27: 20 mL

## 2017-04-27 MED ORDER — METOCLOPRAMIDE HCL 5 MG PO TABS: 5.0000 mg | ORAL_TABLET | Freq: Three times a day (TID) | ORAL | Status: DC | PRN

## 2017-04-27 MED ORDER — URSODIOL 300 MG PO CAPS
300.0000 mg | ORAL_CAPSULE | Freq: Every day | ORAL | Status: DC
  Administered 2017-04-28 – 2017-04-29 (×2): 300 mg via ORAL
  Filled 2017-04-27 (×2): qty 1

## 2017-04-27 MED ORDER — FENTANYL CITRATE (PF) 100 MCG/2ML IJ SOLN
INTRAMUSCULAR | Status: AC
  Filled 2017-04-27: qty 2

## 2017-04-27 MED ORDER — TRANEXAMIC ACID 1000 MG/10ML IV SOLN
1000.0000 mg | INTRAVENOUS | Status: AC
  Administered 2017-04-27: 1000 mg via INTRAVENOUS
  Filled 2017-04-27: qty 1100

## 2017-04-27 MED ORDER — TRANEXAMIC ACID 1000 MG/10ML IV SOLN
1000.0000 mg | Freq: Once | INTRAVENOUS | Status: AC
  Administered 2017-04-27: 1000 mg via INTRAVENOUS
  Filled 2017-04-27: qty 1100

## 2017-04-27 MED ORDER — LACTATED RINGERS IR SOLN
Status: DC | PRN
  Administered 2017-04-27 (×2): 3000 mL

## 2017-04-27 MED ORDER — SODIUM CHLORIDE 0.9 % IJ SOLN
INTRAMUSCULAR | Status: AC
  Filled 2017-04-27: qty 10

## 2017-04-27 MED ORDER — PHENOL 1.4 % MT LIQD: 1.0000 | OROMUCOSAL | Status: DC | PRN

## 2017-04-27 MED ORDER — BUPIVACAINE LIPOSOME 1.3 % IJ SUSP
INTRAMUSCULAR | Status: DC | PRN
  Administered 2017-04-27: 20 mL

## 2017-04-27 MED ORDER — SCOPOLAMINE 1 MG/3DAYS TD PT72
1.0000 | MEDICATED_PATCH | TRANSDERMAL | Status: DC
Start: 2017-04-27 — End: 2017-04-27
  Administered 2017-04-27: 1.5 mg via TRANSDERMAL
  Filled 2017-04-27: qty 1

## 2017-04-27 MED ORDER — BISACODYL 10 MG RE SUPP: 10.0000 mg | Freq: Every day | RECTAL | Status: DC | PRN

## 2017-04-27 MED ORDER — BUPIVACAINE HCL (PF) 0.25 % IJ SOLN
INTRAMUSCULAR | Status: AC
  Filled 2017-04-27: qty 30

## 2017-04-27 MED ORDER — ACETAMINOPHEN 10 MG/ML IV SOLN
1000.0000 mg | Freq: Once | INTRAVENOUS | Status: AC
  Administered 2017-04-27: 1000 mg via INTRAVENOUS
  Filled 2017-04-27: qty 100

## 2017-04-27 MED ORDER — LIDOCAINE 2% (20 MG/ML) 5 ML SYRINGE
INTRAMUSCULAR | Status: AC
  Filled 2017-04-27: qty 5

## 2017-04-27 MED ORDER — CEFAZOLIN SODIUM-DEXTROSE 2-4 GM/100ML-% IV SOLN
2.0000 g | Freq: Four times a day (QID) | INTRAVENOUS | Status: AC
  Administered 2017-04-27 – 2017-04-28 (×2): 2 g via INTRAVENOUS
  Filled 2017-04-27 (×2): qty 100

## 2017-04-27 MED ORDER — FLEET ENEMA 7-19 GM/118ML RE ENEM: 1.0000 | ENEMA | Freq: Once | RECTAL | Status: DC | PRN

## 2017-04-27 MED ORDER — DOCUSATE SODIUM 100 MG PO CAPS
100.0000 mg | ORAL_CAPSULE | Freq: Two times a day (BID) | ORAL | Status: DC
  Administered 2017-04-27 – 2017-04-29 (×5): 100 mg via ORAL
  Filled 2017-04-27 (×5): qty 1

## 2017-04-27 MED ORDER — POLYETHYLENE GLYCOL 3350 17 G PO PACK: 17.0000 g | PACK | Freq: Every day | ORAL | Status: DC | PRN

## 2017-04-27 MED ORDER — SODIUM CHLORIDE 0.9 % IR SOLN
Status: DC | PRN
  Administered 2017-04-27: 1000 mL

## 2017-04-27 MED ORDER — ACETAMINOPHEN 500 MG PO TABS
1000.0000 mg | ORAL_TABLET | Freq: Four times a day (QID) | ORAL | Status: AC
  Administered 2017-04-27 – 2017-04-28 (×4): 1000 mg via ORAL
  Filled 2017-04-27 (×4): qty 2

## 2017-04-27 MED ORDER — MENTHOL 3 MG MT LOZG: 1.0000 | LOZENGE | OROMUCOSAL | Status: DC | PRN

## 2017-04-27 MED ORDER — DIPHENHYDRAMINE HCL 12.5 MG/5ML PO ELIX: 12.5000 mg | ORAL_SOLUTION | ORAL | Status: DC | PRN

## 2017-04-27 MED ORDER — HYDROMORPHONE HCL 1 MG/ML IJ SOLN
INTRAMUSCULAR | Status: AC
  Filled 2017-04-27: qty 2

## 2017-04-27 MED ORDER — CHLORHEXIDINE GLUCONATE 4 % EX LIQD
60.0000 mL | Freq: Once | CUTANEOUS | Status: AC
  Administered 2017-04-27: 4 via TOPICAL

## 2017-04-27 MED ORDER — ROPIVACAINE HCL 7.5 MG/ML IJ SOLN
INTRAMUSCULAR | Status: DC | PRN
  Administered 2017-04-27: 20 mL via PERINEURAL

## 2017-04-27 MED ORDER — OXYCODONE HCL 5 MG PO TABS
10.0000 mg | ORAL_TABLET | ORAL | Status: DC | PRN
  Administered 2017-04-28 (×2): 10 mg via ORAL
  Administered 2017-04-29: 15 mg via ORAL
  Filled 2017-04-27 (×2): qty 2
  Filled 2017-04-27: qty 3
  Filled 2017-04-27: qty 2

## 2017-04-27 MED ORDER — METHOCARBAMOL 1000 MG/10ML IJ SOLN
500.0000 mg | Freq: Four times a day (QID) | INTRAVENOUS | Status: DC | PRN
  Administered 2017-04-27: 500 mg via INTRAVENOUS
  Filled 2017-04-27: qty 550

## 2017-04-27 MED ORDER — SODIUM CHLORIDE 0.9 % IJ SOLN
INTRAMUSCULAR | Status: AC
  Filled 2017-04-27: qty 50

## 2017-04-27 MED ORDER — GABAPENTIN 300 MG PO CAPS
300.0000 mg | ORAL_CAPSULE | Freq: Once | ORAL | Status: AC
  Administered 2017-04-27: 300 mg via ORAL
  Filled 2017-04-27: qty 1

## 2017-04-27 MED ORDER — HYDROMORPHONE HCL 1 MG/ML IJ SOLN
0.2500 mg | INTRAMUSCULAR | Status: DC | PRN
  Administered 2017-04-27 (×4): 0.5 mg via INTRAVENOUS

## 2017-04-27 MED ORDER — OXYCODONE HCL 5 MG PO TABS
5.0000 mg | ORAL_TABLET | ORAL | Status: DC | PRN
  Administered 2017-04-27 – 2017-04-29 (×6): 10 mg via ORAL
  Filled 2017-04-27 (×5): qty 2

## 2017-04-27 MED ORDER — CITALOPRAM HYDROBROMIDE 20 MG PO TABS
20.0000 mg | ORAL_TABLET | Freq: Every day | ORAL | Status: DC
Start: 2017-04-28 — End: 2017-04-29
  Administered 2017-04-28 – 2017-04-29 (×2): 20 mg via ORAL
  Filled 2017-04-27 (×2): qty 1

## 2017-04-27 MED ORDER — METOCLOPRAMIDE HCL 5 MG/ML IJ SOLN: 5.0000 mg | Freq: Three times a day (TID) | INTRAMUSCULAR | Status: DC | PRN

## 2017-04-27 MED ORDER — LIDOCAINE 2% (20 MG/ML) 5 ML SYRINGE
INTRAMUSCULAR | Status: DC | PRN
  Administered 2017-04-27: 100 mg via INTRAVENOUS

## 2017-04-27 MED ORDER — MIDAZOLAM HCL 5 MG/5ML IJ SOLN
INTRAMUSCULAR | Status: DC | PRN
  Administered 2017-04-27 (×2): 1 mg via INTRAVENOUS

## 2017-04-27 MED ORDER — DEXAMETHASONE SODIUM PHOSPHATE 10 MG/ML IJ SOLN
INTRAMUSCULAR | Status: AC
  Filled 2017-04-27: qty 1

## 2017-04-27 MED ORDER — ONDANSETRON HCL 4 MG/2ML IJ SOLN: 4.0000 mg | Freq: Four times a day (QID) | INTRAMUSCULAR | Status: DC | PRN

## 2017-04-27 MED ORDER — METHOCARBAMOL 500 MG PO TABS
500.0000 mg | ORAL_TABLET | Freq: Four times a day (QID) | ORAL | Status: DC | PRN
  Administered 2017-04-28 – 2017-04-29 (×3): 500 mg via ORAL
  Filled 2017-04-27 (×4): qty 1

## 2017-04-27 MED ORDER — FENTANYL CITRATE (PF) 100 MCG/2ML IJ SOLN
50.0000 ug | INTRAMUSCULAR | Status: AC
  Administered 2017-04-27: 50 ug via INTRAVENOUS
  Filled 2017-04-27: qty 2

## 2017-04-27 MED ORDER — GABAPENTIN 300 MG PO CAPS
300.0000 mg | ORAL_CAPSULE | Freq: Three times a day (TID) | ORAL | Status: DC
  Administered 2017-04-27 – 2017-04-29 (×5): 300 mg via ORAL
  Filled 2017-04-27 (×5): qty 1

## 2017-04-27 MED ORDER — DEXAMETHASONE SODIUM PHOSPHATE 10 MG/ML IJ SOLN
10.0000 mg | Freq: Once | INTRAMUSCULAR | Status: AC
  Administered 2017-04-28: 10 mg via INTRAVENOUS
  Filled 2017-04-27: qty 1

## 2017-04-27 MED ORDER — CEFAZOLIN SODIUM-DEXTROSE 2-4 GM/100ML-% IV SOLN
2.0000 g | INTRAVENOUS | Status: AC
  Administered 2017-04-27: 2 g via INTRAVENOUS
  Filled 2017-04-27: qty 100

## 2017-04-27 MED ORDER — FENTANYL CITRATE (PF) 100 MCG/2ML IJ SOLN
INTRAMUSCULAR | Status: DC | PRN
  Administered 2017-04-27 (×7): 50 ug via INTRAVENOUS

## 2017-04-27 MED ORDER — MIDAZOLAM HCL 2 MG/2ML IJ SOLN
1.0000 mg | INTRAMUSCULAR | Status: AC
  Administered 2017-04-27: 2 mg via INTRAVENOUS
  Filled 2017-04-27: qty 2

## 2017-04-27 MED ORDER — PROPOFOL 10 MG/ML IV BOLUS
INTRAVENOUS | Status: DC | PRN
  Administered 2017-04-27: 100 mg via INTRAVENOUS
  Administered 2017-04-27: 200 mg via INTRAVENOUS

## 2017-04-27 MED ORDER — SODIUM CHLORIDE 0.9 % IJ SOLN
INTRAMUSCULAR | Status: DC | PRN
  Administered 2017-04-27: 60 mL

## 2017-04-27 MED ORDER — MIDAZOLAM HCL 2 MG/2ML IJ SOLN
INTRAMUSCULAR | Status: AC
  Filled 2017-04-27: qty 2

## 2017-04-27 MED ORDER — ALPRAZOLAM 0.5 MG PO TABS: 0.5000 mg | ORAL_TABLET | Freq: Every day | ORAL | Status: DC | PRN

## 2017-04-27 MED ORDER — PROMETHAZINE HCL 25 MG/ML IJ SOLN: 6.2500 mg | INTRAMUSCULAR | Status: DC | PRN

## 2017-04-27 MED ORDER — STERILE WATER FOR IRRIGATION IR SOLN
Status: DC | PRN
  Administered 2017-04-27: 2000 mL

## 2017-04-27 MED ORDER — PROPOFOL 10 MG/ML IV BOLUS
INTRAVENOUS | Status: AC
  Filled 2017-04-27: qty 20

## 2017-04-27 MED ORDER — HYDROMORPHONE HCL 1 MG/ML IJ SOLN: 0.5000 mg | INTRAMUSCULAR | Status: DC | PRN

## 2017-04-27 MED ORDER — SODIUM CHLORIDE 0.9 % IV SOLN
INTRAVENOUS | Status: DC
  Administered 2017-04-27 – 2017-04-28 (×2): via INTRAVENOUS

## 2017-04-27 MED ORDER — EPHEDRINE SULFATE-NACL 50-0.9 MG/10ML-% IV SOSY
PREFILLED_SYRINGE | INTRAVENOUS | Status: DC | PRN
  Administered 2017-04-27 (×3): 10 mg via INTRAVENOUS

## 2017-04-27 MED ORDER — DEXAMETHASONE SODIUM PHOSPHATE 10 MG/ML IJ SOLN
10.0000 mg | Freq: Once | INTRAMUSCULAR | Status: AC
  Administered 2017-04-27: 10 mg via INTRAVENOUS

## 2017-04-27 MED ORDER — LACTATED RINGERS IV SOLN
INTRAVENOUS | Status: DC
  Administered 2017-04-27 (×3): via INTRAVENOUS

## 2017-04-27 MED ORDER — FENTANYL CITRATE (PF) 250 MCG/5ML IJ SOLN
INTRAMUSCULAR | Status: AC
  Filled 2017-04-27: qty 5

## 2017-04-27 MED ORDER — ONDANSETRON HCL 4 MG/2ML IJ SOLN
INTRAMUSCULAR | Status: AC
  Filled 2017-04-27: qty 2

## 2017-04-27 MED ORDER — RIVAROXABAN 10 MG PO TABS
10.0000 mg | ORAL_TABLET | Freq: Every day | ORAL | Status: DC
  Administered 2017-04-28 – 2017-04-29 (×2): 10 mg via ORAL
  Filled 2017-04-27 (×2): qty 1

## 2017-04-27 MED ORDER — ONDANSETRON HCL 4 MG PO TABS: 4.0000 mg | ORAL_TABLET | Freq: Four times a day (QID) | ORAL | Status: DC | PRN

## 2017-04-27 SURGICAL SUPPLY — 63 items
BAG ZIPLOCK 12X15 (MISCELLANEOUS) ×3 IMPLANT
BANDAGE ACE 6X5 VEL STRL LF (GAUZE/BANDAGES/DRESSINGS) ×6 IMPLANT
BLADE 4.2CUDA (BLADE) ×3 IMPLANT
BLADE CUDA 4.2 (BLADE) ×3 IMPLANT
BLADE SAG 18X100X1.27 (BLADE) ×3 IMPLANT
BLADE SAW SGTL 11.0X1.19X90.0M (BLADE) ×3 IMPLANT
BOWL SMART MIX CTS (DISPOSABLE) ×3 IMPLANT
CAPT KNEE TOTAL 3 ATTUNE ×3 IMPLANT
CEMENT HV SMART SET (Cement) ×6 IMPLANT
COVER SURGICAL LIGHT HANDLE (MISCELLANEOUS) ×3 IMPLANT
CUFF TOURN SGL QUICK 34 (TOURNIQUET CUFF) ×1
CUFF TRNQT CYL 34X4X40X1 (TOURNIQUET CUFF) ×2 IMPLANT
DECANTER SPIKE VIAL GLASS SM (MISCELLANEOUS) ×3 IMPLANT
DRAPE TOP 10253 STERILE (DRAPES) IMPLANT
DRAPE U-SHAPE 47X51 STRL (DRAPES) ×3 IMPLANT
DRSG ADAPTIC 3X8 NADH LF (GAUZE/BANDAGES/DRESSINGS) ×3 IMPLANT
DRSG EMULSION OIL 3X3 NADH (GAUZE/BANDAGES/DRESSINGS) ×3 IMPLANT
DRSG PAD ABDOMINAL 8X10 ST (GAUZE/BANDAGES/DRESSINGS) ×3 IMPLANT
DURAPREP 26ML APPLICATOR (WOUND CARE) ×6 IMPLANT
ELECT REM PT RETURN 15FT ADLT (MISCELLANEOUS) ×3 IMPLANT
EVACUATOR 1/8 PVC DRAIN (DRAIN) ×3 IMPLANT
GAUZE SPONGE 4X4 12PLY STRL (GAUZE/BANDAGES/DRESSINGS) ×6 IMPLANT
GLOVE BIO SURGEON STRL SZ7.5 (GLOVE) ×3 IMPLANT
GLOVE BIO SURGEON STRL SZ8 (GLOVE) ×6 IMPLANT
GLOVE BIOGEL PI IND STRL 6.5 (GLOVE) IMPLANT
GLOVE BIOGEL PI IND STRL 8 (GLOVE) ×6 IMPLANT
GLOVE BIOGEL PI INDICATOR 6.5 (GLOVE)
GLOVE BIOGEL PI INDICATOR 8 (GLOVE) ×3
GLOVE SURG SS PI 6.5 STRL IVOR (GLOVE) IMPLANT
GOWN STRL REUS W/TWL LRG LVL3 (GOWN DISPOSABLE) ×6 IMPLANT
GOWN STRL REUS W/TWL XL LVL3 (GOWN DISPOSABLE) ×12 IMPLANT
HANDPIECE INTERPULSE COAX TIP (DISPOSABLE) ×1
IMMOBILIZER KNEE 20 (SOFTGOODS) ×3
IMMOBILIZER KNEE 20 THIGH 36 (SOFTGOODS) ×2 IMPLANT
KIT BASIN OR (CUSTOM PROCEDURE TRAY) ×3 IMPLANT
MANIFOLD NEPTUNE II (INSTRUMENTS) ×3 IMPLANT
MARKER SKIN DUAL TIP RULER LAB (MISCELLANEOUS) ×3 IMPLANT
NS IRRIG 1000ML POUR BTL (IV SOLUTION) ×3 IMPLANT
PACK ARTHROSCOPY WL (CUSTOM PROCEDURE TRAY) ×6 IMPLANT
PACK ICE MAXI GEL EZY WRAP (MISCELLANEOUS) ×9 IMPLANT
PACK TOTAL KNEE CUSTOM (KITS) ×3 IMPLANT
PAD ABD 8X10 STRL (GAUZE/BANDAGES/DRESSINGS) ×6 IMPLANT
PADDING CAST COTTON 6X4 STRL (CAST SUPPLIES) ×6 IMPLANT
POSITIONER SURGICAL ARM (MISCELLANEOUS) ×3 IMPLANT
PROBE BIPOLAR ATHRO 135MM 90D (MISCELLANEOUS) ×3 IMPLANT
SET ARTHROSCOPY TUBING (MISCELLANEOUS) ×1
SET ARTHROSCOPY TUBING LN (MISCELLANEOUS) ×2 IMPLANT
SET HNDPC FAN SPRY TIP SCT (DISPOSABLE) ×2 IMPLANT
STRIP CLOSURE SKIN 1/2X4 (GAUZE/BANDAGES/DRESSINGS) ×3 IMPLANT
SUT ETHILON 4 0 PS 2 18 (SUTURE) ×3 IMPLANT
SUT MNCRL AB 4-0 PS2 18 (SUTURE) ×3 IMPLANT
SUT STRATAFIX 0 PDS 27 VIOLET (SUTURE) ×3
SUT VIC AB 2-0 CT1 27 (SUTURE) ×3
SUT VIC AB 2-0 CT1 TAPERPNT 27 (SUTURE) ×6 IMPLANT
SUTURE STRATFX 0 PDS 27 VIOLET (SUTURE) ×2 IMPLANT
SYR 30ML LL (SYRINGE) ×6 IMPLANT
TOWEL OR 17X26 10 PK STRL BLUE (TOWEL DISPOSABLE) ×3 IMPLANT
TRAY FOLEY W/METER SILVER 16FR (SET/KITS/TRAYS/PACK) ×3 IMPLANT
TUBING ARTHRO INFLOW-ONLY STRL (TUBING) ×3 IMPLANT
WAND HAND CNTRL MULTIVAC 90 (MISCELLANEOUS) IMPLANT
WATER STERILE IRR 1000ML POUR (IV SOLUTION) ×6 IMPLANT
WRAP KNEE MAXI GEL POST OP (GAUZE/BANDAGES/DRESSINGS) ×6 IMPLANT
YANKAUER SUCT BULB TIP 10FT TU (MISCELLANEOUS) ×3 IMPLANT

## 2017-04-27 NOTE — Interval H&P Note (Signed)
History and Physical Interval Note:  04/27/2017 12:44 PM  Katrina Huffman  has presented today for surgery, with the diagnosis of Osteoarthritis Right Knee; Left knee hypertrophic synovitis  The various methods of treatment have been discussed with the patient and family. After consideration of risks, benefits and other options for treatment, the patient has consented to  Procedure(s): RIGHT TOTAL KNEE ARTHROPLASTY (Right) Left knee arthroscopy; synovectomy (Left) as a surgical intervention .  The patient's history has been reviewed, patient examined, no change in status, stable for surgery.  I have reviewed the patient's chart and labs.  Questions were answered to the patient's satisfaction.     Pilar Plate Koya Hunger

## 2017-04-27 NOTE — Discharge Instructions (Addendum)
° °Dr. Frank Aluisio °Total Joint Specialist °[facility] °[address] °[place], [place] [address] °[phone] ° °TOTAL KNEE REPLACEMENT POSTOPERATIVE DIRECTIONS ° °Knee Rehabilitation, Guidelines Following Surgery  °Results after knee surgery are often greatly improved when you follow the exercise, range of motion and muscle strengthening exercises prescribed by your doctor. Safety measures are also important to protect the knee from further injury. Any time any of these exercises cause you to have increased pain or swelling in your knee joint, decrease the amount until you are comfortable again and slowly increase them. If you have problems or questions, call your caregiver or physical therapist for advice.  ° °HOME CARE INSTRUCTIONS  °Remove items at home which could result in a fall. This includes throw rugs or furniture in walking pathways.  °· ICE to the affected knee every three hours for 30 minutes at a time and then as needed for pain and swelling.  Continue to use ice on the knee for pain and swelling from surgery. You may notice swelling that will progress down to the foot and ankle.  This is normal after surgery.  Elevate the leg when you are not up walking on it.   °· Continue to use the breathing machine which will help keep your temperature down.  It is common for your temperature to cycle up and down following surgery, especially at night when you are not up moving around and exerting yourself.  The breathing machine keeps your lungs expanded and your temperature down. °· Do not place pillow under knee, focus on keeping the knee straight while resting ° °DIET °You may resume your previous home diet once your are discharged from the hospital. ° °DRESSING / WOUND CARE / SHOWERING °You may shower 3 days after surgery, but keep the wounds dry during showering.  You may use an occlusive plastic wrap (Press'n Seal for example), NO SOAKING/SUBMERGING IN THE BATHTUB.  If the bandage gets  wet, change with a clean dry gauze.  If the incision gets wet, pat the wound dry with a clean towel. °You may start showering once you are discharged home but do not submerge the incision under water. Just pat the incision dry and apply a dry gauze dressing on daily. °Change the surgical dressing daily and reapply a dry dressing each time. ° °ACTIVITY °Walk with your walker as instructed. °Use walker as long as suggested by your caregivers. °Avoid periods of inactivity such as sitting longer than an hour when not asleep. This helps prevent blood clots.  °You may resume a sexual relationship in one month or when given the OK by your doctor.  °You may return to work once you are cleared by your doctor.  °Do not drive a car for 6 weeks or until released by you surgeon.  °Do not drive while taking narcotics. ° °WEIGHT BEARING °Weight bearing as tolerated with assist device (walker, cane, etc) as directed, use it as long as suggested by your surgeon or therapist, typically at least 4-6 weeks. ° °POSTOPERATIVE CONSTIPATION PROTOCOL °Constipation - defined medically as fewer than three stools per week and severe constipation as less than one stool per week. ° °One of the most common issues patients have following surgery is constipation.  Even if you have a regular bowel pattern at home, your normal regimen is likely to be disrupted due to multiple reasons following surgery.  Combination of anesthesia, postoperative narcotics, change in appetite and fluid intake all can affect your bowels.    In order to avoid complications following surgery, here are some recommendations in order to help you during your recovery period. ° °Colace (docusate) - Pick up an over-the-counter form of Colace or another stool softener and take twice a day as long as you are requiring postoperative pain medications.  Take with a full glass of water daily.  If you experience loose stools or diarrhea, hold the colace until you stool forms back up.  If  your symptoms do not get better within 1 week or if they get worse, check with your doctor. ° °Dulcolax (bisacodyl) - Pick up over-the-counter and take as directed by the product packaging as needed to assist with the movement of your bowels.  Take with a full glass of water.  Use this product as needed if not relieved by Colace only.  ° °MiraLax (polyethylene glycol) - Pick up over-the-counter to have on hand.  MiraLax is a solution that will increase the amount of water in your bowels to assist with bowel movements.  Take as directed and can mix with a glass of water, juice, soda, coffee, or tea.  Take if you go more than two days without a movement. °Do not use MiraLax more than once per day. Call your doctor if you are still constipated or irregular after using this medication for 7 days in a row. ° °If you continue to have problems with postoperative constipation, please contact the office for further assistance and recommendations.  If you experience "the worst abdominal pain ever" or develop nausea or vomiting, please contact the office immediatly for further recommendations for treatment. ° °ITCHING ° If you experience itching with your medications, try taking only a single pain pill, or even half a pain pill at a time.  You can also use Benadryl over the counter for itching or also to help with sleep.  ° °TED HOSE STOCKINGS °Wear the elastic stockings on both legs for three weeks following surgery during the day but you may remove then at night for sleeping. ° °MEDICATIONS °See your medication summary on the “After Visit Summary” that the nursing staff will review with you prior to discharge.  You may have some home medications which will be placed on hold until you complete the course of blood thinner medication.  It is important for you to complete the blood thinner medication as prescribed by your surgeon.  Continue your approved medications as instructed at time of discharge. ° °PRECAUTIONS °If you  experience chest pain or shortness of breath - call 911 immediately for transfer to the hospital emergency department.  °If you develop a fever greater that 101 F, purulent drainage from wound, increased redness or drainage from wound, foul odor from the wound/dressing, or calf pain - CONTACT YOUR SURGEON.   °                                                °FOLLOW-UP APPOINTMENTS °Make sure you keep all of your appointments after your operation with your surgeon and caregivers. You should call the office at the above phone number and make an appointment for approximately two weeks after the date of your surgery or on the date instructed by your surgeon outlined in the "After Visit Summary". ° ° °RANGE OF MOTION AND STRENGTHENING EXERCISES  °Rehabilitation of the knee is important following a knee injury or   an operation. After just a few days of immobilization, the muscles of the thigh which control the knee become weakened and shrink (atrophy). Knee exercises are designed to build up the tone and strength of the thigh muscles and to improve knee motion. Often times heat used for twenty to thirty minutes before working out will loosen up your tissues and help with improving the range of motion but do not use heat for the first two weeks following surgery. These exercises can be done on a training (exercise) mat, on the floor, on a table or on a bed. Use what ever works the best and is most comfortable for you Knee exercises include:  °Leg Lifts - While your knee is still immobilized in a splint or cast, you can do straight leg raises. Lift the leg to 60 degrees, hold for 3 sec, and slowly lower the leg. Repeat 10-20 times 2-3 times daily. Perform this exercise against resistance later as your knee gets better.  °Quad and Hamstring Sets - Tighten up the muscle on the front of the thigh (Quad) and hold for 5-10 sec. Repeat this 10-20 times hourly. Hamstring sets are done by pushing the foot backward against an object  and holding for 5-10 sec. Repeat as with quad sets.  °· Leg Slides: Lying on your back, slowly slide your foot toward your buttocks, bending your knee up off the floor (only go as far as is comfortable). Then slowly slide your foot back down until your leg is flat on the floor again. °· Angel Wings: Lying on your back spread your legs to the side as far apart as you can without causing discomfort.  °A rehabilitation program following serious knee injuries can speed recovery and prevent re-injury in the future due to weakened muscles. Contact your doctor or a physical therapist for more information on knee rehabilitation.  ° °IF YOU ARE TRANSFERRED TO A SKILLED REHAB FACILITY °If the patient is transferred to a skilled rehab facility following release from the hospital, a list of the current medications will be sent to the facility for the patient to continue.  When discharged from the skilled rehab facility, please have the facility set up the patient's Home Health Physical Therapy prior to being released. Also, the skilled facility will be responsible for providing the patient with their medications at time of release from the facility to include their pain medication, the muscle relaxants, and their blood thinner medication. If the patient is still at the rehab facility at time of the two week follow up appointment, the skilled rehab facility will also need to assist the patient in arranging follow up appointment in our office and any transportation needs. ° °MAKE SURE YOU:  °Understand these instructions.  °Get help right away if you are not doing well or get worse.  ° ° °Pick up stool softner and laxative for home use following surgery while on pain medications. °Do not submerge incision under water. °Please use good hand washing techniques while changing dressing each day. °May shower starting three days after surgery. °Please use a clean towel to pat the incision dry following showers. °Continue to use ice for  pain and swelling after surgery. °Do not use any lotions or creams on the incision until instructed by your surgeon. ° °Take Xarelto for two and a half more weeks following discharge from the hospital, then discontinue Xarelto. °Once the patient has completed the blood thinner regimen, then take a Baby 81 mg Aspirin daily for three   more weeks.        Dr. [name] Total Joint Specialist [facility] [address], [place], [place] [address] [phone]   Arthroscopic Procedure, Knee An arthroscopic procedure can find what is wrong with your knee. PROCEDURE Arthroscopy is a surgical technique that allows your orthopedic surgeon to diagnose and treat your knee injury with accuracy. They will look into your knee through a small instrument. This is almost like a small (pencil sized) telescope. Because arthroscopy affects your knee less than open knee surgery, you can anticipate a more rapid recovery. Taking an active role by following your caregiver's instructions will help with rapid and complete recovery. Use crutches, rest, elevation, ice, and knee exercises as instructed. The length of recovery depends on various factors including type of injury, age, physical condition, medical conditions, and your rehabilitation. Your knee is the joint between the large bones (femur and tibia) in your leg. Cartilage covers these bone ends which are smooth and slippery and allow your knee to bend and move smoothly. Two menisci, thick, semi-lunar shaped pads of cartilage which form a rim inside the joint, help absorb shock and stabilize your knee. Ligaments bind the bones together and support your knee joint. Muscles move the joint, help support your knee, and take stress off the joint itself. Because of this all programs and physical therapy to rehabilitate an injured or repaired knee require rebuilding and strengthening your muscles. AFTER THE PROCEDURE  After the procedure, you will be moved  to a recovery area until most of the effects of the medication have worn off. Your caregiver will discuss the test results with you.   Only take over-the-counter or prescription medicines for pain, discomfort, or fever as directed by your caregiver.  SEEK MEDICAL CARE IF:   You have increased bleeding from your wounds.   You see redness, swelling, or have increasing pain in your wounds.   You have pus coming from your wound.   You have an oral temperature above 102 F (38.9 C).   You notice a bad smell coming from the wound or dressing.   You have severe pain with any motion of your knee.  SEEK IMMEDIATE MEDICAL CARE IF:   You develop a rash.   You have difficulty breathing.   You have any allergic problems.  FURTHER INSTRUCTIONS:   ICE to the affected knee every three hours for 30 minutes at a time and then as needed for pain and swelling.  Continue to use ice on the knee for pain and swelling from surgery. You may notice swelling that will progress down to the foot and ankle.  This is normal after surgery.  Elevate the leg when you are not up walking on it.    DIET You may resume your previous home diet once your are discharged from the hospital.  DRESSING / WOUND CARE / SHOWERING You may shower 3 days after surgery, but keep the wounds dry during showering.  You may use an occlusive plastic wrap (Press'n Seal for example), NO SOAKING/SUBMERGING IN THE BATHTUB.  If the bandage gets wet, change with a clean dry gauze.  If the incision gets wet, pat the wound dry with a clean towel. You may start showering two days after being discharged home but do not submerge the incisions under water.  Change dressing 48 hours after the procedure and then cover the small incisions with band aids until your follow up visit. Change the surgical dressings daily and reapply  a dry dressing each time.   ACTIVITY Walk with your walker as instructed. Use walker as long as suggested by your  caregivers. Avoid periods of inactivity such as sitting longer than an hour when not asleep. This helps prevent blood clots.  You may resume a sexual relationship in one month or when given the OK by your doctor.  You may return to work once you are cleared by your doctor.  Do not drive a car for 6 weeks or until released by you surgeon.  Do not drive while taking narcotics.  WEIGHT BEARING Weight bearing as tolerated with assist device (walker, cane, etc) as directed, use it as long as suggested by your surgeon or therapist, typically at least 4-6 weeks.  POSTOPERATIVE CONSTIPATION PROTOCOL Constipation - defined medically as fewer than three stools per week and severe constipation as less than one stool per week.  One of the most common issues patients have following surgery is constipation.  Even if you have a regular bowel pattern at home, your normal regimen is likely to be disrupted due to multiple reasons following surgery.  Combination of anesthesia, postoperative narcotics, change in appetite and fluid intake all can affect your bowels.  In order to avoid complications following surgery, here are some recommendations in order to help you during your recovery period.  Colace (docusate) - Pick up an over-the-counter form of Colace or another stool softener and take twice a day as long as you are requiring postoperative pain medications.  Take with a full glass of water daily.  If you experience loose stools or diarrhea, hold the colace until you stool forms back up.  If your symptoms do not get better within 1 week or if they get worse, check with your doctor.  Dulcolax (bisacodyl) - Pick up over-the-counter and take as directed by the product packaging as needed to assist with the movement of your bowels.  Take with a full glass of water.  Use this product as needed if not relieved by Colace only.   MiraLax (polyethylene glycol) - Pick up over-the-counter to have on hand.  MiraLax is a  solution that will increase the amount of water in your bowels to assist with bowel movements.  Take as directed and can mix with a glass of water, juice, soda, coffee, or tea.  Take if you go more than two days without a movement. Do not use MiraLax more than once per day. Call your doctor if you are still constipated or irregular after using this medication for 7 days in a row.  If you continue to have problems with postoperative constipation, please contact the office for further assistance and recommendations.  If you experience "the worst abdominal pain ever" or develop nausea or vomiting, please contact the office immediatly for further recommendations for treatment.  ITCHING  If you experience itching with your medications, try taking only a single pain pill, or even half a pain pill at a time.  You can also use Benadryl over the counter for itching or also to help with sleep.   TED HOSE STOCKINGS Wear the elastic stockings on both legs for three weeks following surgery during the day but you may remove then at night for sleeping.  MEDICATIONS See your medication summary on the After Visit Summary that the nursing staff will review with you prior to discharge.  You may have some home medications which will be placed on hold until you complete the course of blood thinner medication.  It is important for you to complete the blood thinner medication as prescribed by your surgeon.  Continue your approved medications as instructed at time of discharge. Do not drive while taking narcotics.   PRECAUTIONS If you experience chest pain or shortness of breath - call 911 immediately for transfer to the hospital emergency department.  If you develop a fever greater that 101 F, purulent drainage from wound, increased redness or drainage from wound, foul odor from the wound/dressing, or calf pain - CONTACT YOUR SURGEON.                                                   FOLLOW-UP APPOINTMENTS Make sure  you keep all of your appointments after your operation with your surgeon and caregivers. You should call the office at [phone]  and make an appointment for approximately one week after the date of your surgery or on the date instructed by your surgeon outlined in the "After Visit Summary".  RANGE OF MOTION AND STRENGTHENING EXERCISES  Rehabilitation of the knee is important following a knee injury or an operation. After just a few days of immobilization, the muscles of the thigh which control the knee become weakened and shrink (atrophy). Knee exercises are designed to build up the tone and strength of the thigh muscles and to improve knee motion. Often times heat used for twenty to thirty minutes before working out will loosen up your tissues and help with improving the range of motion but do not use heat for the first two weeks following surgery. These exercises can be done on a training (exercise) mat, on the floor, on a table or on a bed. Use what ever works the best and is most comfortable for you Knee exercises include:  QUAD STRENGTHENING EXERCISES Strengthening Quadriceps Sets  Tighten muscles on top of thigh by pushing knees down into floor or table. Hold for 20 seconds. Repeat 10 times. Do 2 sessions per day.     Strengthening Terminal Knee Extension  With knee bent over bolster, straighten knee by tightening muscle on top of thigh. Be sure to keep bottom of knee on bolster. Hold for 20 seconds. Repeat 10 times. Do 2 sessions per day.   Straight Leg with Bent Knee  Lie on back with opposite leg bent. Keep involved knee slightly bent at knee and raise leg 4-6". Hold for 10 seconds. Repeat 20 times per set. Do 2 sets per session. Do 2 sessions per day.     Information on my medicine - XARELTO (Rivaroxaban)  Why was Xarelto prescribed for you? Xarelto was prescribed for you to reduce the risk of blood clots forming after orthopedic surgery. The medical term for  these abnormal blood clots is venous thromboembolism (VTE).  What do you need to know about xarelto ? Take your Xarelto ONCE DAILY at the same time every day. You may take it either with or without food.  If you have difficulty swallowing the tablet whole, you may crush it and mix in applesauce just prior to taking your dose.  Take Xarelto exactly as prescribed by your doctor and DO NOT stop taking Xarelto without talking to the doctor who prescribed the medication.  Stopping without other VTE prevention medication to take the place of Xarelto may increase your risk of developing a clot.  After discharge, you  should have regular check-up appointments with your healthcare provider that is prescribing your Xarelto.    What do you do if you miss a dose? If you miss a dose, take it as soon as you remember on the same day then continue your regularly scheduled once daily regimen the next day. Do not take two doses of Xarelto on the same day.   Important Safety Information A possible side effect of Xarelto is bleeding. You should call your healthcare provider right away if you experience any of the following: ? Bleeding from an injury or your nose that does not stop. ? Unusual colored urine (red or dark brown) or unusual colored stools (red or black). ? Unusual bruising for unknown reasons. ? A serious fall or if you hit your head (even if there is no bleeding).  Some medicines may interact with Xarelto and might increase your risk of bleeding while on Xarelto. To help avoid this, consult your healthcare provider or pharmacist prior to using any new prescription or non-prescription medications, including herbals, vitamins, non-steroidal anti-inflammatory drugs (NSAIDs) and supplements.  This website has more information on Xarelto: [email or web address]

## 2017-04-27 NOTE — Anesthesia Postprocedure Evaluation (Signed)
Anesthesia Post Note  Patient: Katrina Huffman  Procedure(s) Performed: RIGHT TOTAL KNEE ARTHROPLASTY (Right Knee) Left knee arthroscopy; synovectomy (Left Knee)     Patient location during evaluation: PACU Anesthesia Type: General Level of consciousness: awake and alert Pain management: pain level controlled Vital Signs Assessment: post-procedure vital signs reviewed and stable Respiratory status: spontaneous breathing, nonlabored ventilation, respiratory function stable and patient connected to nasal cannula oxygen Cardiovascular status: blood pressure returned to baseline and stable Postop Assessment: no apparent nausea or vomiting Anesthetic complications: no    Last Vitals:  Vitals:   04/27/17 1748 04/27/17 1853  BP: (!) 141/71 124/61  Pulse: 82 89  Resp: 12 14  Temp: 36.8 C 37.2 C  SpO2: 99% 99%    Last Pain:  Vitals:   04/27/17 1853  TempSrc: Oral  PainSc:                  Alioune Hodgkin S

## 2017-04-27 NOTE — Progress Notes (Signed)
Assisted Dr. Rose with right, ultrasound guided, adductor canal block. Side rails up, monitors on throughout procedure. See vital signs in flow sheet. Tolerated Procedure well.  

## 2017-04-27 NOTE — Transfer of Care (Signed)
Immediate Anesthesia Transfer of Care Note  Patient: Katrina Huffman  Procedure(s) Performed: RIGHT TOTAL KNEE ARTHROPLASTY (Right Knee) Left knee arthroscopy; synovectomy (Left Knee)  Patient Location: PACU  Anesthesia Type:General  Level of Consciousness: awake, alert  and oriented  Airway & Oxygen Therapy: Patient Spontanous Breathing and Patient connected to face mask oxygen  Post-op Assessment: Report given to RN and Post -op Vital signs reviewed and stable  Post vital signs: Reviewed and stable  Last Vitals:  Vitals Value Taken Time  BP    Temp    Pulse    Resp    SpO2      Last Pain:  Vitals:   04/27/17 1206  TempSrc:   PainSc: 3       Patients Stated Pain Goal: 3 (34/37/35 7897)  Complications: No apparent anesthesia complications

## 2017-04-27 NOTE — Anesthesia Procedure Notes (Signed)
Anesthesia Regional Block: Adductor canal block   Pre-Anesthetic Checklist: ,, timeout performed, Correct Patient, Correct Site, Correct Laterality, Correct Procedure, Correct Position, site marked, Risks and benefits discussed,  Surgical consent,  Pre-op evaluation,  At surgeon's request and post-op pain management  Laterality: Right  Prep: chloraprep       Needles:  Injection technique: Single-shot  Needle Type: Echogenic Needle     Needle Length: 9cm      Additional Needles:   Procedures:,,,, ultrasound used (permanent image in chart),,,,  Narrative:  Start time: 04/27/2017 1:01 PM End time: 04/27/2017 1:10 PM Injection made incrementally with aspirations every 5 mL.  Performed by: Personally  Anesthesiologist: Myrtie Soman, MD  Additional Notes: Patient tolerated the procedure well without complications

## 2017-04-27 NOTE — Op Note (Signed)
OPERATIVE REPORT-TOTAL KNEE ARTHROPLASTY   Pre-operative diagnosis- Osteoarthritis  Right knee(s)   Patellar clunk syndrome left knee  Post-operative diagnosis- Osteoarthritis Right knee(s)   2. Patellar clunk syndrome left knee  Procedure-  Right  Total Knee Arthroplasty   2. Left knee arthroscopy with synovectomy  Surgeon- Dione Plover. Albertine Lafoy, MD  Assistant- [name], PA-C   Anesthesia-  GA combined with regional for post-op pain  EBL-50 mL   Drains Hemovac  Tourniquet time- 43 minutes @ 756 mm Hg    Complications- None  Condition-PACU - hemodynamically stable.   Brief Clinical Note  Katrina Huffman is a 62 y.o. year old female with end stage OA of her right knee with progressively worsening pain and dysfunction. She has constant pain, with activity and at rest and significant functional deficits with difficulties even with ADLs. She has had extensive non-op management including analgesics, injections of cortisone and viscosupplements, and home exercise program, but remains in significant pain with significant dysfunction.Radiographs show bone on bone arthritis medial and patellofemoral. She presents now for right Total Knee Arthroplasty.  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 She presents for left knee arthroscopy and synovectomy.  Procedure in detail---   The patient is brought into the operating room and positioned supine on the operating table. After successful administration of  GA combined with regional for post-op pain,   a tourniquet is placed high on the  Right thigh(s) and the lower extremity is prepped and draped in the usual sterile fashion. Time out is performed by the operating team and then the  Right lower extremity is wrapped in Esmarch, knee flexed and the tourniquet inflated to 300 mmHg.       A midline incision is made with a ten blade through the subcutaneous tissue to the level of the extensor mechanism. A  fresh blade is used to make a medial parapatellar arthrotomy. Soft tissue over the proximal medial tibia is subperiosteally elevated to the joint line with a knife and into the semimembranosus bursa with a Cobb elevator. Soft tissue over the proximal lateral tibia is elevated with attention being paid to avoiding the patellar tendon on the tibial tubercle. The patella is everted, knee flexed 90 degrees and the ACL and PCL are removed. Findings are bone on bone medial and patellofemoral with large global osteophytes.        The drill is used to create a starting hole in the distal femur and the canal is thoroughly irrigated with sterile saline to remove the fatty contents. The 5 degree Right  valgus alignment guide is placed into the femoral canal and the distal femoral cutting block is pinned to remove 9 mm off the distal femur. Resection is made with an oscillating saw.      The tibia is subluxed forward and the menisci are removed. The extramedullary alignment guide is placed referencing proximally at the medial aspect of the tibial tubercle and distally along the second metatarsal axis and tibial crest. The block is pinned to remove 35mm off the more deficient medial  side. Resection is made with an oscillating saw. Size 5is the most appropriate size for the tibia and the proximal tibia is prepared with the modular drill and keel punch for that size.      The femoral sizing guide is placed and size 6 is most appropriate. Rotation is marked off the epicondylar axis and confirmed by creating a rectangular flexion gap at 90  degrees. The size 5 cutting block is pinned in this rotation and the anterior, posterior and chamfer cuts are made with the oscillating saw. The intercondylar block is then placed and that cut is made.      Trial size 5 tibial component, trial size 6 posterior stabilized femur and a 8  mm posterior stabilized rotating platform insert trial is placed. Full extension is achieved with excellent  varus/valgus and anterior/posterior balance throughout full range of motion. The patella is everted and thickness measured to be 22  mm. Free hand resection is taken to 12 mm, a 38 template is placed, lug holes are drilled, trial patella is placed, and it tracks normally. Osteophytes are removed off the posterior femur with the trial in place. All trials are removed and the cut bone surfaces prepared with pulsatile lavage. Cement is mixed and once ready for implantation, the size 5 tibial implant, size  6 posterior stabilized femoral component, and the size 38 patella are cemented in place and the patella is held with the clamp. The trial insert is placed and the knee held in full extension. The Exparel (20 ml mixed with 60 ml saline) is injected into the extensor mechanism, posterior capsule, medial and lateral gutters and subcutaneous tissues.  All extruded cement is removed and once the cement is hard the permanent 8 mm posterior stabilized rotating platform insert is placed into the tibial tray.      The wound is copiously irrigated with saline solution and the extensor mechanism closed over a hemovac drain with #1 V-loc suture. The tourniquet is released for a total tourniquet time of 43  minutes. Flexion against gravity is 140 degrees and the patella tracks normally. Subcutaneous tissue is closed with 2.0 vicryl and subcuticular with running 4.0 Monocryl. The incision is cleaned and dried and steri-strips and a bulky sterile dressing are applied. The limb is placed into a knee immobilizer.       The left lower extremity is prepped and draped in the usual sterile fashion. The standard superomedial and inferolateral arthroscopy incisions are made with an 11 blade and the inflow cannula is passed superomedial and camera passed inferolateral. Arthroscopic visualization proceeds.      There is a large mound of hypertrophic synovial tissue present at the junction of the patellar component and quadriceps tendon,  which essentially obliterates the suprapatellar pouch. An 11 blade is used to create a superolateral portal and then using a combination of the shaver and Edge device, the tissue is debrided to normal appearing tissue. This recreated the suprapatellar pouch. The joint is further inspected again and no other abnormal tissue is identified. The arthroscopic equipment is removed from the lateral portals which are closed with 4-0 nylon. 20 ml of .25% Marcaine is injected through the inflow cannula which is removed and that portal is also closed with nylon suture. The incisions are cleaned and dried and a bulky sterile dressing applied. The patient is awakened and transported to recovery in stable condition.      Please note that a surgical assistant was a medical necessity for this procedure in order to perform it in a safe and expeditious manner. Surgical assistant was necessary to retract the ligaments and vital neurovascular structures to prevent injury to them and also necessary for proper positioning of the limb to allow for anatomic placement of the prosthesis.   Dione Plover Katrina Mcdonnell, MD    04/27/2017, 4:21 PM

## 2017-04-27 NOTE — Anesthesia Procedure Notes (Signed)
Anesthesia Procedure Image    

## 2017-04-27 NOTE — Anesthesia Preprocedure Evaluation (Signed)
Anesthesia Evaluation  Patient identified by MRN, date of birth, ID band Patient awake    Reviewed: Allergy & Precautions, NPO status , Patient's Chart, lab work & pertinent test results  Airway Mallampati: II  TM Distance: >3 FB Neck ROM: Full    Dental no notable dental hx.    Pulmonary sleep apnea ,    Pulmonary exam normal breath sounds clear to auscultation       Cardiovascular hypertension, Normal cardiovascular exam Rhythm:Regular Rate:Normal     Neuro/Psych negative neurological ROS  negative psych ROS   GI/Hepatic negative GI ROS, Neg liver ROS,   Endo/Other  obesity  Renal/GU negative Renal ROS  negative genitourinary   Musculoskeletal negative musculoskeletal ROS (+)   Abdominal   Peds negative pediatric ROS (+)  Hematology negative hematology ROS (+)   Anesthesia Other Findings   Reproductive/Obstetrics negative OB ROS                             Anesthesia Physical Anesthesia Plan  ASA: III  Anesthesia Plan: General   Post-op Pain Management:  Regional for Post-op pain   Induction: Intravenous  PONV Risk Score and Plan: 3 and Ondansetron, Dexamethasone and Treatment may vary due to age or medical condition  Airway Management Planned: LMA and Oral ETT  Additional Equipment:   Intra-op Plan:   Post-operative Plan: Extubation in OR  Informed Consent: I have reviewed the patients History and Physical, chart, labs and discussed the procedure including the risks, benefits and alternatives for the proposed anesthesia with the patient or authorized representative who has indicated his/her understanding and acceptance.   Dental advisory given  Plan Discussed with: CRNA and Surgeon  Anesthesia Plan Comments:         Anesthesia Quick Evaluation

## 2017-04-27 NOTE — Anesthesia Procedure Notes (Signed)
Procedure Name: LMA Insertion Date/Time: 04/27/2017 1:57 PM Performed by: Montel Clock, CRNA Pre-anesthesia Checklist: Patient identified, Emergency Drugs available, Suction available, Patient being monitored and Timeout performed Patient Re-evaluated:Patient Re-evaluated prior to induction Oxygen Delivery Method: Circle system utilized Preoxygenation: Pre-oxygenation with 100% oxygen Induction Type: IV induction Ventilation: Mask ventilation without difficulty LMA: LMA with gastric port inserted LMA Size: 4.0 Number of attempts: 1 Dental Injury: Teeth and Oropharynx as per pre-operative assessment

## 2017-04-28 LAB — BASIC METABOLIC PANEL
ANION GAP: 8 (ref 5–15)
BUN: 11 mg/dL (ref 6–20)
CHLORIDE: 105 mmol/L (ref 101–111)
CO2: 28 mmol/L (ref 22–32)
Calcium: 8.4 mg/dL — ABNORMAL LOW (ref 8.9–10.3)
Creatinine, Ser: 0.61 mg/dL (ref 0.44–1.00)
GFR calc Af Amer: >60 mL/min (ref >60)
GFR calc non Af Amer: >60 mL/min (ref >60)
GLUCOSE: 115 mg/dL — AB (ref 65–99)
POTASSIUM: 4.1 mmol/L (ref 3.5–5.1)
Sodium: 141 mmol/L (ref 135–145)

## 2017-04-28 LAB — CBC
HEMATOCRIT: 33.6 % — AB (ref 36.0–46.0)
HEMOGLOBIN: 10.5 g/dL — AB (ref 12.0–15.0)
MCH: 28 pg (ref 26.0–34.0)
MCHC: 31.3 g/dL (ref 30.0–36.0)
MCV: 89.6 fL (ref 78.0–100.0)
Platelets: 223 10*3/uL (ref 150–400)
RBC: 3.75 MIL/uL — AB (ref 3.87–5.11)
RDW: 15.6 % — ABNORMAL HIGH (ref 11.5–15.5)
WBC: 9 10*3/uL (ref 4.0–10.5)

## 2017-04-28 MED ORDER — RIVAROXABAN 10 MG PO TABS: 10.0000 mg | ORAL_TABLET | Freq: Every day | ORAL | 0 refills | Status: DC

## 2017-04-28 MED ORDER — PANTOPRAZOLE SODIUM 40 MG PO TBEC
40.0000 mg | DELAYED_RELEASE_TABLET | Freq: Two times a day (BID) | ORAL | Status: DC
  Administered 2017-04-28 – 2017-04-29 (×2): 40 mg via ORAL
  Filled 2017-04-28 (×2): qty 1

## 2017-04-28 MED ORDER — GABAPENTIN 300 MG PO CAPS: 300.0000 mg | ORAL_CAPSULE | Freq: Three times a day (TID) | ORAL | 0 refills | Status: DC

## 2017-04-28 MED ORDER — PANTOPRAZOLE SODIUM 40 MG PO TBEC: 40.0000 mg | DELAYED_RELEASE_TABLET | Freq: Two times a day (BID) | ORAL | 1 refills | Status: DC

## 2017-04-28 MED ORDER — METHOCARBAMOL 500 MG PO TABS: 500.0000 mg | ORAL_TABLET | Freq: Four times a day (QID) | ORAL | 0 refills | Status: DC | PRN

## 2017-04-28 MED ORDER — OXYCODONE HCL 5 MG PO TABS: 5.0000 mg | ORAL_TABLET | ORAL | 0 refills | Status: DC | PRN

## 2017-04-28 MED ORDER — GI COCKTAIL ~~LOC~~
30.0000 mL | Freq: Once | ORAL | Status: AC
  Administered 2017-04-28: 30 mL via ORAL
  Filled 2017-04-28: qty 30

## 2017-04-28 NOTE — Addendum Note (Signed)
Addendum  created 04/28/17 0816 by Lollie Sails, CRNA   Charge Capture section accepted

## 2017-04-28 NOTE — Evaluation (Signed)
Physical Therapy Evaluation Patient Details Name: Katrina Huffman MRN: [id number] DOB: 1955-05-12 Today's Date: 04/28/2017   History of Present Illness  R TKA, L knee arthoscopy; R TSA Sept 2018, bariatric sleeve 01/2017  Clinical Impression  Pt is s/p TKA resulting in the deficits listed below (see PT Problem List). Pt ambulated 22' x 2 with RW, distance limited by R knee pain. Instructed pt in TKA HEP, good progress expected.  Pt will benefit from skilled PT to increase their independence and safety with mobility to allow discharge to the venue listed below.      Follow Up Recommendations Follow surgeon's recommendation for DC plan and follow-up therapies    Equipment Recommendations  None recommended by PT    Recommendations for Other Services       Precautions / Restrictions Precautions Precautions: Knee Precaution Comments: no pillow under knee Required Braces or Orthoses: Knee Immobilizer - Right Knee Immobilizer - Right: On when out of bed or walking Restrictions Weight Bearing Restrictions: No Other Position/Activity Restrictions: WBAT      Mobility  Bed Mobility Overal bed mobility: Needs Assistance Bed Mobility: Supine to Sit     Supine to sit: Min assist     General bed mobility comments: min A for RLE  Transfers Overall transfer level: Needs assistance Equipment used: Rolling walker (2 wheeled) Transfers: Sit to/from Stand Sit to Stand: Mod assist;+2 physical assistance;+2 safety/equipment         General transfer comment: +2 from recliner, +1 from elevated bed, VCs hand placement  Ambulation/Gait Ambulation/Gait assistance: Min assist Ambulation Distance (Feet): 44 Feet(22' x 2 with seated rest) Assistive device: Rolling walker (2 wheeled) Gait Pattern/deviations: Step-to pattern   Gait velocity interpretation: Below normal speed for age/gender General Gait Details: VCs sequencing, distance limited by pain in R knee  Stairs             Wheelchair Mobility    Modified Rankin (Stroke Patients Only)       Balance Overall balance assessment: Modified Independent                                           Pertinent Vitals/Pain Pain Assessment: 0-10 Pain Score: 10-Worst pain ever Pain Location:  R knee with walking Pain Descriptors / Indicators: Sore;Sharp Pain Intervention(s): Limited activity within patient's tolerance;Monitored during session;Premedicated before session;Ice applied    Home Living Family/patient expects to be discharged to:: Private residence Living Arrangements: Alone Available Help at Discharge: Family;Available 24 hours/day Type of Home: House Home Access: Stairs to enter   [employer] of Steps: 1 Home Layout: One level Home Equipment: Clinical cytogeneticist - 2 wheels;Cane - single point Additional Comments: has sink next to high commode; brother and sister to stay with pt 24*    Prior Function Level of Independence: Independent         Comments: drives, works     Journalist, newspaper   Dominant Hand: Left    Extremity/Trunk Assessment   Upper Extremity Assessment Upper Extremity Assessment: Overall WFL for tasks assessed    Lower Extremity Assessment Lower Extremity Assessment: RLE deficits/detail RLE Deficits / Details: SLR 3/5, knee 5-45* AAROM RLE Sensation: WNL RLE Coordination: WNL       Communication   Communication: No difficulties  Cognition Arousal/Alertness: Awake/alert Behavior During Therapy: WFL for tasks assessed/performed Overall Cognitive Status: Within Functional Limits for tasks assessed  General Comments      Exercises Total Joint Exercises Ankle Circles/Pumps: AROM;Both;10 reps;Supine Quad Sets: AROM;Both;5 reps;Supine Heel Slides: AAROM;Both;10 reps;Supine Straight Leg Raises: AAROM;Right;5 reps;Supine Goniometric ROM: 5-45* AAROM R knee   Assessment/Plan     PT Assessment Patient needs continued PT services  PT Problem List Decreased strength;Decreased range of motion;Decreased activity tolerance;Decreased mobility       PT Treatment Interventions DME instruction;Gait training    PT Goals (Current goals can be found in the Care Plan section)  Acute Rehab PT Goals Patient Stated Goal: to walk and ride exercise bike PT Goal Formulation: With patient/family Time For Goal Achievement: 05/05/17 Potential to Achieve Goals: Good    Frequency 7X/week   Barriers to discharge        Co-evaluation               AM-PAC PT "6 Clicks" Daily Activity  Outcome Measure Difficulty turning over in bed (including adjusting bedclothes, sheets and blankets)?: A Little Difficulty moving from lying on back to sitting on the side of the bed? : A Little Difficulty sitting down on and standing up from a chair with arms (e.g., wheelchair, bedside commode, etc,.)?: Unable Help needed moving to and from a bed to chair (including a wheelchair)?: A Little Help needed walking in hospital room?: A Little Help needed climbing 3-5 steps with a railing? : A Lot 6 Click Score: 15    End of Session Equipment Utilized During Treatment: Gait belt Activity Tolerance: Patient limited by pain;Patient tolerated treatment well Patient left: in chair;with call bell/phone within reach;with family/visitor present Nurse Communication: Mobility status PT Visit Diagnosis: Difficulty in walking, not elsewhere classified (R26.2);Muscle weakness (generalized) (M62.81);Pain Pain - Right/Left: Right Pain - part of body: Knee    Time: 0092-3300 PT Time Calculation (min) (ACUTE ONLY): 32 min   Charges:   PT Evaluation $PT Eval Low Complexity: 1 Low PT Treatments $Gait Training: 8-22 mins   PT G Codes:          [name] 04/28/2017, 11:45 AM [phone]

## 2017-04-28 NOTE — Progress Notes (Signed)
   Subjective: 1 Day Post-Op Procedure(s) (LRB): RIGHT TOTAL KNEE ARTHROPLASTY (Right) Left knee arthroscopy; synovectomy (Left) Patient reports pain as mild.   Patient seen in rounds for Dr. Wynelle Link.  She had some complaints of indigestion which was upper esophagus and substernal.  She denied any history of cardiac issues and had no other complaints (N/V/SOB/Radiation of Pain/Diaphoresis). GI cocktail which relieved the indigestion.  Once it wore off, started Protonix twice a day. Patient is well, but has had some minor complaints of pain in the knee, requiring pain medications Start therapy today and did well with walking. Plan is to go Home after hospital stay.  Objective: Vital signs in last 24 hours: Temp:  [98.8 F (37.1 C)-99.6 F (37.6 C)] 99.6 F (37.6 C) (03/26 2048) Pulse Rate:  [64-83] 67 (03/26 2048) Resp:  [15-17] 17 (03/26 2048) BP: (108-142)/(52-84) 142/63 (03/26 2048) SpO2:  [94 %-98 %] 97 % (03/26 2048)  Intake/Output from previous day:  Intake/Output Summary (Last 24 hours) at 04/28/2017 2058 Last data filed at 04/28/2017 2049 Gross per 24 hour  Intake 2243.33 ml  Output 2000 ml  Net 243.33 ml    Intake/Output this shift: Total I/O In: 80 [P.O.:80] Out: -   Labs: Recent Labs    04/28/17 0632  HGB 10.5*   Recent Labs    04/28/17 0632  WBC 9.0  RBC 3.75*  HCT 33.6*  PLT 223   Recent Labs    04/28/17 0632  NA 141  K 4.1  CL 105  CO2 28  BUN 11  CREATININE 0.61  GLUCOSE 115*  CALCIUM 8.4*   No results for input(s): LABPT, INR in the last 72 hours.  EXAM General - Patient is Alert, Appropriate and Oriented Extremity - Neurovascular intact Sensation intact distally Intact pulses distally Dorsiflexion/Plantar flexion intact Dressing - dressing C/D/I Motor Function - intact, moving foot and toes well on exam.  Hemovac pulled without difficulty.  Past Medical History:  Diagnosis Date  . Anxiety   . Arthritis    hands, knees, back    . Depression   . Edema    left leg knee to foot, since veein stripping  . History of chicken pox   . History of hiatal hernia   . Hypertension   . PONV (postoperative nausea and vomiting)    PONV after knee arthroplasty  . Scoliosis    sees chiropracter monthly  . Shortness of breath dyspnea    "out of shape"  . Sleep apnea    cpap    Assessment/Plan: 1 Day Post-Op Procedure(s) (LRB): RIGHT TOTAL KNEE ARTHROPLASTY (Right) Left knee arthroscopy; synovectomy (Left) Principal Problem:   OA (osteoarthritis) of knee  Estimated body mass index is 38.11 kg/m as calculated from the following:   Height as of this encounter: 5\' 5"  (1.651 m).   Weight as of this encounter: 103.9 kg (229 lb). Advance diet Up with therapy Plan for discharge tomorrow  DVT Prophylaxis - Xarelto Weight-Bearing as tolerated to left leg D/C O2 and Pulse OX and try on Room Air  [name], PA-C Orthopaedic Surgery 04/28/2017, 8:58 PM

## 2017-04-28 NOTE — Progress Notes (Signed)
Physical Therapy Treatment Patient Details Name: Katrina Huffman MRN: [id number] DOB: 10/04/1955 Today's Date: 04/28/2017    History of Present Illness R TKA, L knee arthoscopy; R TSA Sept 2018, bariatric sleeve 01/2017    PT Comments    Pt progressing well with mobility. She ambulated 120' with RW, no LOB. Performed TKA exercises with min assist.   Follow Up Recommendations  Follow surgeon's recommendation for DC plan and follow-up therapies(HHPT per pt)     Equipment Recommendations  None recommended by PT    Recommendations for Other Services       Precautions / Restrictions Precautions Precautions: Knee Precaution Comments: no pillow under knee Required Braces or Orthoses: Knee Immobilizer - Right Knee Immobilizer - Right: On when out of bed or walking Restrictions Weight Bearing Restrictions: No Other Position/Activity Restrictions: WBAT    Mobility  Bed Mobility Overal bed mobility: Needs Assistance Bed Mobility: Sit to Supine     Supine to sit: Min assist Sit to supine: Min assist   General bed mobility comments: min A for RLE  Transfers Overall transfer level: Needs assistance Equipment used: Rolling walker (2 wheeled) Transfers: Sit to/from Stand Sit to Stand: Mod assist;         General transfer comment: mod A to rise,  VCs hand placement  Ambulation/Gait Ambulation/Gait assistance: Min assist Ambulation Distance (Feet): 120 Feet Assistive device: Rolling walker (2 wheeled) Gait Pattern/deviations: Step-to pattern;Decreased step length - right;Decreased step length - left   Gait velocity interpretation: Below normal speed for age/gender General Gait Details: VCs sequencing, distance limited by pain in R knee   Stairs            Wheelchair Mobility    Modified Rankin (Stroke Patients Only)       Balance Overall balance assessment: Modified Independent                                          Cognition  Arousal/Alertness: Awake/alert Behavior During Therapy: WFL for tasks assessed/performed Overall Cognitive Status: Within Functional Limits for tasks assessed                                        Exercises Total Joint Exercises Ankle Circles/Pumps: AROM;Both;10 reps;Supine Quad Sets: AROM;Both;5 reps;Supine Short Arc Quad: AAROM;Right;10 reps;Supine Heel Slides: AAROM;both;10 reps;Supine Straight Leg Raises: both;;Supine;10 reps;AROM Goniometric ROM: 5-45* AAROM R knee    General Comments        Pertinent Vitals/Pain Pain Assessment: 0-10 Pain Score: 7  Pain Location:  R knee with walking Pain Descriptors / Indicators: Sore;Sharp Pain Intervention(s): Limited activity within patient's tolerance;Monitored during session;Premedicated before session;Ice applied    Home Living Family/patient expects to be discharged to:: Private residence Living Arrangements: Alone Available Help at Discharge: Family;Available 24 hours/day Type of Home: House Home Access: Stairs to enter   Home Layout: One level Home Equipment: Clinical cytogeneticist - 2 wheels;Cane - single point Additional Comments: has sink next to high commode; brother and sister to stay with pt 24*    Prior Function Level of Independence: Independent      Comments: drives, works   PT Goals (current goals can now be found in the care plan section) Acute Rehab PT Goals Patient Stated Goal: to walk and ride exercise bike PT Goal Formulation:  With patient/family Time For Goal Achievement: 05/05/17 Potential to Achieve Goals: Good Progress towards PT goals: Progressing toward goals    Frequency    7X/week      PT Plan Current plan remains appropriate    Co-evaluation              AM-PAC PT "6 Clicks" Daily Activity  Outcome Measure  Difficulty turning over in bed (including adjusting bedclothes, sheets and blankets)?: None Difficulty moving from lying on back to sitting on the side of  the bed? : A Little Difficulty sitting down on and standing up from a chair with arms (e.g., wheelchair, bedside commode, etc,.)?: Unable Help needed moving to and from a bed to chair (including a wheelchair)?: A Little Help needed walking in hospital room?: A Little Help needed climbing 3-5 steps with a railing? : A Lot 6 Click Score: 16    End of Session Equipment Utilized During Treatment: Gait belt Activity Tolerance: Patient limited by pain;Patient tolerated treatment well Patient left: in chair;with call bell/phone within reach;with family/visitor present Nurse Communication: Mobility status PT Visit Diagnosis: Difficulty in walking, not elsewhere classified (R26.2);Muscle weakness (generalized) (M62.81);Pain Pain - Right/Left: Right Pain - part of body: Knee     Time: 1610-9604 PT Time Calculation (min) (ACUTE ONLY): 23 min  Charges:  $Gait Training: 8-22 mins $Therapeutic Exercise: 8-22 mins                    G Codes:          [name] 04/28/2017, 2:43 PM [phone]

## 2017-04-28 NOTE — Progress Notes (Signed)
Discharge planning, spoke with patient and spouse at bedside. Have chosen [facility] for [facility] PT, evaluate and treat. Contacted [facility] for referral. Has a RW and declines a 3n1. [phone]

## 2017-04-28 NOTE — Plan of Care (Signed)
  Problem: Education: Goal: Knowledge of the prescribed therapeutic regimen will improve Outcome: Progressing   Problem: Activity: Goal: Ability to avoid complications of mobility impairment will improve Outcome: Progressing Goal: Range of joint motion will improve Outcome: Progressing   Problem: Clinical Measurements: Goal: Postoperative complications will be avoided or minimized Outcome: Progressing   Problem: Pain Management: Goal: Pain level will decrease with appropriate interventions Outcome: Progressing   Problem: Skin Integrity: Goal: Signs of wound healing will improve Outcome: Progressing   Problem: Education: Goal: Knowledge of General Education information will improve Outcome: Progressing   Problem: Health Behavior/Discharge Planning: Goal: Ability to manage health-related needs will improve Outcome: Progressing   Problem: Clinical Measurements: Goal: Ability to maintain clinical measurements within normal limits will improve Outcome: Progressing Goal: Will remain free from infection Outcome: Progressing Goal: Diagnostic test results will improve Outcome: Progressing Goal: Respiratory complications will improve Outcome: Progressing Goal: Cardiovascular complication will be avoided Outcome: Progressing   Problem: Activity: Goal: Risk for activity intolerance will decrease Outcome: Progressing   Problem: Nutrition: Goal: Adequate nutrition will be maintained Outcome: Progressing   Problem: Coping: Goal: Level of anxiety will decrease Outcome: Progressing   Problem: Elimination: Goal: Will not experience complications related to bowel motility Outcome: Progressing Goal: Will not experience complications related to urinary retention Outcome: Progressing   Problem: Pain Managment: Goal: General experience of comfort will improve Outcome: Progressing   Problem: Safety: Goal: Ability to remain free from injury will improve Outcome: Progressing   Problem: Skin Integrity: Goal: Risk for impaired skin integrity will decrease Outcome: Progressing

## 2017-04-28 NOTE — Discharge Summary (Signed)
Physician Discharge Summary   Patient ID: Katrina Huffman MRN: [id number] DOB/AGE: Feb 07, 1955 62 y.o.  Admit date: 04/27/2017 Discharge date: 04/29/2017  Primary Diagnosis:  Right  Total Knee Arthroplasty                         2. Left knee arthroscopy with synovectomy  Admission Diagnoses:  Past Medical History:  Diagnosis Date  . Anxiety   . Arthritis    hands, knees, back  . Depression   . Edema    left leg knee to foot, since veein stripping  . History of chicken pox   . History of hiatal hernia   . Hypertension   . PONV (postoperative nausea and vomiting)    PONV after knee arthroplasty  . Scoliosis    sees chiropracter monthly  . Shortness of breath dyspnea    "out of shape"  . Sleep apnea    cpap   Discharge Diagnoses:   Principal Problem:   OA (osteoarthritis) of knee  Estimated body mass index is 38.11 kg/m as calculated from the following:   Height as of this encounter: _0  (1.651 m).   Weight as of this encounter: 103.9 kg (229 lb).  Procedure:  Procedure(s) (LRB): RIGHT TOTAL KNEE ARTHROPLASTY (Right) Left knee arthroscopy; synovectomy (Left)   Consults: None  HPI: Katrina Huffman is a 62 y.o. year old female with end stage OA of her right knee with progressively worsening pain and dysfunction. She has constant pain, with activity and at rest and significant functional deficits with difficulties even with ADLs. She has had extensive non-op management including analgesics, injections of cortisone and viscosupplements, and home exercise program, but remains in significant pain with significant dysfunction.Radiographs show bone on bone arthritis medial and patellofemoral. She presents now for right Total Knee Arthroplasty.  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 She presents for left knee arthroscopy and synovectomy.   Laboratory Data: Admission on 04/27/2017  Component Date Value Ref Range Status  . WBC  04/28/2017 9.0  4.0 - 10.5 K/uL Final  . RBC 04/28/2017 3.75* 3.87 - 5.11 MIL/uL Final  . Hemoglobin 04/28/2017 10.5* 12.0 - 15.0 g/dL Final  . HCT 04/28/2017 33.6* 36.0 - 46.0 % Final  . MCV 04/28/2017 89.6  78.0 - 100.0 fL Final  . MCH 04/28/2017 28.0  26.0 - 34.0 pg Final  . MCHC 04/28/2017 31.3  30.0 - 36.0 g/dL Final  . RDW 04/28/2017 15.6* 11.5 - 15.5 % Final  . Platelets 04/28/2017 223  150 - 400 K/uL Final   Performed at [facility], [place] [address], [place], [place] [address]  . Sodium 04/28/2017 141  135 - 145 mmol/L Final  . Potassium 04/28/2017 4.1  3.5 - 5.1 mmol/L Final  . Chloride 04/28/2017 105  101 - 111 mmol/L Final  . CO2 04/28/2017 28  22 - 32 mmol/L Final  . Glucose, Bld 04/28/2017 115* 65 - 99 mg/dL Final  . BUN 04/28/2017 11  6 - 20 mg/dL Final  . Creatinine, Ser 04/28/2017 0.61  0.44 - 1.00 mg/dL Final  . Calcium 04/28/2017 8.4* 8.9 - 10.3 mg/dL Final  . GFR calc non Af Amer 04/28/2017 >60  >60 mL/min Final  . GFR calc Af Amer 04/28/2017 >60  >60 mL/min Final   Comment: (NOTE) The eGFR has been calculated using the CKD EPI equation. This calculation has not been validated in all  clinical situations. eGFR's persistently <60 mL/min signify possible Chronic Kidney Disease.   Georgiann Hahn gap 04/28/2017 8  5 - 15 Final   Performed at [facility], [place] [address] [place], [place] [address]  Hospital Outpatient Visit on 04/20/2017  Component Date Value Ref Range Status  . MRSA, PCR 04/20/2017 NEGATIVE  NEGATIVE Final  . Staphylococcus aureus 04/20/2017 NEGATIVE  NEGATIVE Final   Comment: (NOTE) The Xpert SA Assay (FDA approved for NASAL specimens in patients 65 years of age and older), is one component of a comprehensive surveillance program. It is not intended to diagnose infection nor to guide or monitor treatment. Performed at [facility], [place] [address], [place], [place] [address]   . aPTT 04/20/2017  33  24 - 36 seconds Final   Performed at [facility], [place] [address], [place], [place] [address]  . WBC 04/20/2017 6.1  4.0 - 10.5 K/uL Final  . RBC 04/20/2017 4.48  3.87 - 5.11 MIL/uL Final  . Hemoglobin 04/20/2017 12.5  12.0 - 15.0 g/dL Final  . HCT 04/20/2017 39.4  36.0 - 46.0 % Final  . MCV 04/20/2017 87.9  78.0 - 100.0 fL Final  . MCH 04/20/2017 27.9  26.0 - 34.0 pg Final  . MCHC 04/20/2017 31.7  30.0 - 36.0 g/dL Final  . RDW 04/20/2017 15.4  11.5 - 15.5 % Final  . Platelets 04/20/2017 241  150 - 400 K/uL Final   Performed at [facility], [place] [address] [place], [place] [address]  . Sodium 04/20/2017 140  135 - 145 mmol/L Final  . Potassium 04/20/2017 4.1  3.5 - 5.1 mmol/L Final  . Chloride 04/20/2017 105  101 - 111 mmol/L Final  . CO2 04/20/2017 26  22 - 32 mmol/L Final  . Glucose, Bld 04/20/2017 100* 65 - 99 mg/dL Final  . BUN 04/20/2017 30* 6 - 20 mg/dL Final  . Creatinine, Ser 04/20/2017 0.52  0.44 - 1.00 mg/dL Final  . Calcium 04/20/2017 9.1  8.9 - 10.3 mg/dL Final  . Total Protein 04/20/2017 6.9  6.5 - 8.1 g/dL Final  . Albumin 04/20/2017 4.0  3.5 - 5.0 g/dL Final  . AST 04/20/2017 19  15 - 41 U/L Final  . ALT 04/20/2017 14  14 - 54 U/L Final  . Alkaline Phosphatase 04/20/2017 85  38 - 126 U/L Final  . Total Bilirubin 04/20/2017 0.7  0.3 - 1.2 mg/dL Final  . GFR calc non Af Amer 04/20/2017 >60  >60 mL/min Final  . GFR calc Af Amer 04/20/2017 >60  >60 mL/min Final   Comment: (NOTE) The eGFR has been calculated using the CKD EPI equation. This calculation has not been validated in all clinical situations. eGFR's persistently <60 mL/min signify possible Chronic Kidney Disease.   Georgiann Hahn gap 04/20/2017 9  5 - 15 Final   Performed at [facility], [place] [address] [place], [place] [address]  . Prothrombin Time 04/20/2017 14.8  11.4 - 15.2 seconds Final  . INR 04/20/2017 1.17   Final   Performed at [facility], [place] [address], [place], [place] [address]  . ABO/RH(D) 04/20/2017 O POS   Final  . Antibody Screen 04/20/2017 NEG   Final  . Sample Expiration 04/20/2017 04/30/2017   Final  . Extend sample reason 04/20/2017    Final                   Value:NO  TRANSFUSIONS OR PREGNANCY IN THE PAST 3 MONTHS Performed at [place] [address], [place], [place] [address]   Office Visit on 03/06/2017  Component Date Value Ref Range Status  . WBC 03/06/2017 7.4  4.0 - 10.5 K/uL Final  . RBC 03/06/2017 4.68  3.87 - 5.11 Mil/uL Final  . Hemoglobin 03/06/2017 12.9  12.0 - 15.0 g/dL Final  . HCT 03/06/2017 39.3  36.0 - 46.0 % Final  . MCV 03/06/2017 83.9  78.0 - 100.0 fl Final  . MCHC 03/06/2017 32.7  30.0 - 36.0 g/dL Final  . RDW 03/06/2017 15.0  11.5 - 15.5 % Final  . Platelets 03/06/2017 313.0  150.0 - 400.0 K/uL Final  . Neutrophils Relative % 03/06/2017 62.7  43.0 - 77.0 % Final  . Lymphocytes Relative 03/06/2017 24.8  12.0 - 46.0 % Final  . Monocytes Relative 03/06/2017 9.0  3.0 - 12.0 % Final  . Eosinophils Relative 03/06/2017 2.6  0.0 - 5.0 % Final  . Basophils Relative 03/06/2017 0.9  0.0 - 3.0 % Final  . Neutro Abs 03/06/2017 4.6  1.4 - 7.7 K/uL Final  . Lymphs Abs 03/06/2017 1.8  0.7 - 4.0 K/uL Final  . Monocytes Absolute 03/06/2017 0.7  0.1 - 1.0 K/uL Final  . Eosinophils Absolute 03/06/2017 0.2  0.0 - 0.7 K/uL Final  . Basophils Absolute 03/06/2017 0.1  0.0 - 0.1 K/uL Final  . Sodium 03/06/2017 141  135 - 145 mEq/L Final  . Potassium 03/06/2017 4.1  3.5 - 5.1 mEq/L Final  . Chloride 03/06/2017 103  96 - 112 mEq/L Final  . CO2 03/06/2017 30  19 - 32 mEq/L Final  . Glucose, Bld 03/06/2017 105* 70 - 99 mg/dL Final  . BUN 03/06/2017 24* 6 - 23 mg/dL Final  . Creatinine, Ser 03/06/2017 0.60  0.40 - 1.20 mg/dL Final  . Total Bilirubin 03/06/2017 0.5  0.2 - 1.2 mg/dL Final  . Alkaline Phosphatase 03/06/2017 77  39 - 117 U/L Final  . AST 03/06/2017 18   0 - 37 U/L Final  . ALT 03/06/2017 17  0 - 35 U/L Final  . Total Protein 03/06/2017 7.4  6.0 - 8.3 g/dL Final  . Albumin 03/06/2017 4.1  3.5 - 5.2 g/dL Final  . Calcium 03/06/2017 9.5  8.4 - 10.5 mg/dL Final  . GFR 03/06/2017 107.28  >60.00 mL/min Final  . TSH 03/06/2017 2.09  0.35 - 4.50 uIU/mL Final  . VITD 03/06/2017 38.52  30.00 - 100.00 ng/mL Final  . Cholesterol 03/06/2017 168  0 - 200 mg/dL Final   ATP III Classification       Desirable:  < 200 mg/dL               Borderline High:  200 - 239 mg/dL          High:  > = 240 mg/dL  . Triglycerides 03/06/2017 102.0  0.0 - 149.0 mg/dL Final   Normal:  <150 mg/dLBorderline High:  150 - 199 mg/dL  . HDL 03/06/2017 40.10  >39.00 mg/dL Final  . VLDL 03/06/2017 20.4  0.0 - 40.0 mg/dL Final  . LDL Cholesterol 03/06/2017 107* 0 - 99 mg/dL Final  . Total CHOL/HDL Ratio 03/06/2017 4   Final                  Men          Women1/2 Average Risk     3.4  3.3Average Risk          5.0          4.42X Average Risk          9.6          7.13X Average Risk          15.0          11.0                      . NonHDL 03/06/2017 127.42   Final   NOTE:  Non-HDL goal should be 30 mg/dL higher than patient's LDL goal (i.e. LDL goal of < 70 mg/dL, would have non-HDL goal of < 100 mg/dL)  . Vitamin B-12 03/06/2017 793  211 - 911 pg/mL Final     X-Rays:No results found.  EKG: Orders placed or performed during the hospital encounter of 04/20/17  . EKG 12 lead  . EKG 12 lead     Hospital Course: Katrina Huffman is a 62 y.o. who was admitted to [facility]. They were brought to the operating room on 04/27/2017 and underwent Procedure(s): RIGHT TOTAL KNEE ARTHROPLASTY Left knee arthroscopy; synovectomy.  Patient tolerated the procedure well and was later transferred to the recovery room and then to the orthopaedic floor for postoperative care.  They were given PO and IV analgesics for pain control following their surgery.  They were given 24 hours of  postoperative antibiotics of  Anti-infectives (From admission, onward)   Start     Dose/Rate Route Frequency Ordered Stop   04/27/17 2000  ceFAZolin (ANCEF) IVPB 2g/100 mL premix     2 g 200 mL/hr over 30 Minutes Intravenous Every 6 hours 04/27/17 1745 04/28/17 0234   04/27/17 1139  ceFAZolin (ANCEF) IVPB 2g/100 mL premix     2 g 200 mL/hr over 30 Minutes Intravenous On call to O.R. 04/27/17 1139 04/27/17 1403     and started on DVT prophylaxis in the form of Xarelto.   PT and OT were ordered for total joint protocol.  Discharge planning consulted to help with postop disposition and equipment needs.  Patient had a tough night on the evening of surgery.  On day one, she had some complaints of indigestion which was upper esophagus and substernal.  She denied any history of cardiac issues and had no other complaints (N/V/SOB/Radiation of Pain/Diaphoresis). GI cocktail given which relieved the indigestion.  Once it wore off, started Protonix twice a day. They started to get up OOB with therapy on day one. Hemovac drain was pulled without difficulty.  Continued to work with therapy into day two.  Dressing was changed on day two and the incision was healing well.  Patient was seen in rounds on day two and was ready to go home.   Diet: Regular diet Activity:WBAT Follow-up:in 2 weeks Disposition - Home Discharged Condition: stable   Discharge Instructions    Call MD / Call 911   Complete by:  As directed    If you experience chest pain or shortness of breath, CALL 911 and be transported to the hospital emergency room.  If you develope a fever above 101 F, pus (white drainage) or increased drainage or redness at the wound, or calf pain, call your surgeon's office.   Change dressing   Complete by:  As directed    Change dressing daily with sterile 4 x 4 inch gauze dressing and apply TED hose. Do not submerge the incision under  water.   Constipation Prevention   Complete by:  As directed    Drink  plenty of fluids.  Prune juice may be helpful.  You may use a stool softener, such as Colace (over the counter) 100 mg twice a day.  Use MiraLax (over the counter) for constipation as needed.   Diet - low sodium heart healthy   Complete by:  As directed    Discharge instructions   Complete by:  As directed    Take Xarelto for two and a half more weeks, then discontinue Xarelto. Once the patient has completed the blood thinner regimen, then take a Baby 81 mg Aspirin daily for three more weeks.   Pick up stool softner and laxative for home use following surgery while on pain medications. Do not submerge incision under water. Please use good hand washing techniques while changing dressing each day. May shower starting three days after surgery. Please use a clean towel to pat the incision dry following showers. Continue to use ice for pain and swelling after surgery. Do not use any lotions or creams on the incision until instructed by your surgeon.  Wear both TED hose on both legs during the day every day for three weeks, but may remove the TED hose at night at home.  Postoperative Constipation Protocol  Constipation - defined medically as fewer than three stools per week and severe constipation as less than one stool per week.  One of the most common issues patients have following surgery is constipation.  Even if you have a regular bowel pattern at home, your normal regimen is likely to be disrupted due to multiple reasons following surgery.  Combination of anesthesia, postoperative narcotics, change in appetite and fluid intake all can affect your bowels.  In order to avoid complications following surgery, here are some recommendations in order to help you during your recovery period.  Colace (docusate) - Pick up an over-the-counter form of Colace or another stool softener and take twice a day as long as you are requiring postoperative pain medications.  Take with a full glass of water daily.   If you experience loose stools or diarrhea, hold the colace until you stool forms back up.  If your symptoms do not get better within 1 week or if they get worse, check with your doctor.  Dulcolax (bisacodyl) - Pick up over-the-counter and take as directed by the product packaging as needed to assist with the movement of your bowels.  Take with a full glass of water.  Use this product as needed if not relieved by Colace only.   MiraLax (polyethylene glycol) - Pick up over-the-counter to have on hand.  MiraLax is a solution that will increase the amount of water in your bowels to assist with bowel movements.  Take as directed and can mix with a glass of water, juice, soda, coffee, or tea.  Take if you go more than two days without a movement. Do not use MiraLax more than once per day. Call your doctor if you are still constipated or irregular after using this medication for 7 days in a row.  If you continue to have problems with postoperative constipation, please contact the office for further assistance and recommendations.  If you experience "the worst abdominal pain ever" or develop nausea or vomiting, please contact the office immediatly for further recommendations for treatment.   Do not put a pillow under the knee. Place it under the heel.   Complete by:  As directed  Do not sit on low chairs, stoools or toilet seats, as it may be difficult to get up from low surfaces   Complete by:  As directed    Driving restrictions   Complete by:  As directed    No driving until released by the physician.   Increase activity slowly as tolerated   Complete by:  As directed    Lifting restrictions   Complete by:  As directed    No lifting until released by the physician.   Patient may shower   Complete by:  As directed    You may shower without a dressing once there is no drainage.  Do not wash over the wound.  If drainage remains, do not shower until drainage stops.   TED hose   Complete by:  As  directed    Use stockings (TED hose) for 3 weeks on both leg(s).  You may remove them at night for sleeping.   Weight bearing as tolerated   Complete by:  As directed    Laterality:  bilateral   Extremity:  Lower     Allergies as of 04/28/2017   No Known Allergies     Medication List    STOP taking these medications   amoxicillin 500 MG tablet Commonly known as:  AMOXIL   BARIATRIC FUSION PO   calcium carbonate 1250 (500 Ca) MG tablet Commonly known as:  OS-CAL - dosed in mg of elemental calcium   meloxicam 15 MG tablet Commonly known as:  MOBIC   Vitamin D 2000 units tablet     TAKE these medications   ALPRAZolam 0.5 MG tablet Commonly known as:  XANAX Take 0.5 mg by mouth daily as needed for anxiety.   citalopram 20 MG tablet Commonly known as:  CELEXA Take 20 mg by mouth daily.   gabapentin 300 MG capsule Commonly known as:  NEURONTIN Take 1 capsule (300 mg total) by mouth 3 (three) times daily. Gabapentin 300 mg Protocol Take a 300 mg capsule three times a day for two weeks, Then a 300 mg capsule twice a day for two weeks, Then a 300 mg capsule once a day for two weeks, then discontinue the Gabapentin.   methocarbamol 500 MG tablet Commonly known as:  ROBAXIN Take 1 tablet (500 mg total) by mouth every 6 (six) hours as needed for muscle spasms.   oxyCODONE 5 MG immediate release tablet Commonly known as:  Oxy IR/ROXICODONE Take 1-2 tablets (5-10 mg total) by mouth every 4 (four) hours as needed for moderate pain or severe pain.   pantoprazole 40 MG tablet Commonly known as:  PROTONIX Take 1 tablet (40 mg total) by mouth 2 (two) times daily before a meal. Start taking on:  04/29/2017   rivaroxaban 10 MG Tabs tablet Commonly known as:  XARELTO Take 1 tablet (10 mg total) by mouth daily with breakfast. Take Xarelto for two and a half more weeks following discharge from the hospital, then discontinue Xarelto. Once the patient has completed the blood thinner  regimen, then take a Baby 81 mg Aspirin daily for three more weeks. Start taking on:  04/29/2017   ursodiol 300 MG capsule Commonly known as:  ACTIGALL Take 300 mg by mouth daily.            Discharge Care Instructions  (From admission, onward)        Start     Ordered   04/28/17 0000  Weight bearing as tolerated    Question Answer  Comment  Laterality bilateral   Extremity Lower      04/28/17 2107   04/28/17 0000  Change dressing    Comments:  Change dressing daily with sterile 4 x 4 inch gauze dressing and apply TED hose. Do not submerge the incision under water.   04/28/17 2107     Follow-up Information    [facility] Follow up.   Specialty:  [place] Why:  physical therapy Contact information: [place] [address] [phone]        [name], MD. Schedule an appointment as soon as possible for a visit on 05/12/2017.   Specialty:  Orthopedic Surgery Contact information: [address] [place] [address] [phone]           Signed: [name], PA-C Orthopaedic Surgery 04/28/2017, 9:09 PM

## 2017-04-29 LAB — CBC
HEMATOCRIT: 34 % — AB (ref 36.0–46.0)
Hemoglobin: 10.6 g/dL — ABNORMAL LOW (ref 12.0–15.0)
MCH: 28 pg (ref 26.0–34.0)
MCHC: 31.2 g/dL (ref 30.0–36.0)
MCV: 89.7 fL (ref 78.0–100.0)
Platelets: 198 10*3/uL (ref 150–400)
RBC: 3.79 MIL/uL — ABNORMAL LOW (ref 3.87–5.11)
RDW: 15.6 % — AB (ref 11.5–15.5)
WBC: 8.5 10*3/uL (ref 4.0–10.5)

## 2017-04-29 LAB — BASIC METABOLIC PANEL
Anion gap: 8 (ref 5–15)
BUN: 12 mg/dL (ref 6–20)
CALCIUM: 8.5 mg/dL — AB (ref 8.9–10.3)
CO2: 29 mmol/L (ref 22–32)
CREATININE: 0.67 mg/dL (ref 0.44–1.00)
Chloride: 102 mmol/L (ref 101–111)
GFR calc Af Amer: >60 mL/min (ref >60)
GFR calc non Af Amer: >60 mL/min (ref >60)
GLUCOSE: 112 mg/dL — AB (ref 65–99)
Potassium: 3.5 mmol/L (ref 3.5–5.1)
Sodium: 139 mmol/L (ref 135–145)

## 2017-04-29 NOTE — Progress Notes (Signed)
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 She is ready to DC home from PT standpoint.    Follow Up Recommendations  Follow surgeon's recommendation for DC plan and follow-up therapies(HHPT per pt)     Equipment Recommendations  None recommended by PT    Recommendations for Other Services       Precautions / Restrictions Precautions Precautions: Knee Precaution Comments: no pillow under knee Restrictions Weight Bearing Restrictions: No Other Position/Activity Restrictions: WBAT    Mobility  Bed Mobility Overal bed mobility: Modified Independent Bed Mobility: Supine to Sit     Supine to sit: Modified independent (Device/Increase time)     General bed mobility comments: pt up in recliner  Transfers Overall transfer level: Needs assistance Equipment used: Rolling walker (2 wheeled) Transfers: Sit to/from Stand Sit to Stand: Min guard         General transfer comment: good hand placement  Ambulation/Gait Ambulation/Gait assistance: Supervision Ambulation Distance (Feet): 85 Feet Assistive device: Rolling walker (2 wheeled) Gait Pattern/deviations: Step-to pattern;Decreased step length - right;Decreased step length - left   Gait velocity interpretation: Below normal speed for age/gender General Gait Details:  distance limited by pain in R knee, didn't use KI today, no buckling   Stairs Stairs: Yes   Stair Management: No rails;Backwards;Step to pattern;With walker Number of Stairs: 1 General stair comments: VCs sequencing, min A to manage RW  Wheelchair Mobility    Modified Rankin (Stroke Patients Only)       Balance Overall balance assessment: Modified Independent                                           Cognition Arousal/Alertness: Awake/alert Behavior During Therapy: WFL for tasks assessed/performed Overall Cognitive Status: Within Functional Limits for tasks assessed                                        Exercises Total Joint Exercises Ankle Circles/Pumps: AROM;Both;10 reps;Supine Quad Sets: AROM;Both;Supine;10 reps Towel Squeeze: AROM;Both;10 reps;Supine Short Arc Quad: AAROM;Right;10 reps;Supine Heel Slides: AAROM;10 reps;Supine;Both Hip ABduction/ADduction: AAROM;Right;10 reps;Supine Straight Leg Raises: Supine;10 reps;AROM Long Arc Quad: AAROM;5 reps;Seated Knee Flexion: AAROM;Right;10 reps;Seated Goniometric ROM: 5-50* AAROM R knee    General Comments        Pertinent Vitals/Pain Pain Score: 6  Pain Location:  R knee with walking Pain Descriptors / Indicators: Aching Pain Intervention(s): Limited activity within patient's tolerance;Monitored during session;Premedicated before session;Ice applied    Home Living                      Prior Function            PT Goals (current goals can now be found in the care plan section) Acute Rehab PT Goals Patient Stated Goal: to walk and ride exercise bike PT Goal Formulation: With patient Time For Goal Achievement: 05/05/17 Potential to Achieve Goals: Good Progress towards PT goals: Progressing toward goals    Frequency    7X/week      PT Plan Current plan remains appropriate  Co-evaluation              AM-PAC PT "6 Clicks" Daily Activity  Outcome Measure  Difficulty turning over in bed (including adjusting bedclothes, sheets and blankets)?: None Difficulty moving from lying on back to sitting on the side of the bed? : A Little Difficulty sitting down on and standing up from a chair with arms (e.g., wheelchair, bedside commode, etc,.)?: A Little Help needed moving to and from a bed to chair (including a wheelchair)?: None Help needed walking in hospital room?:  None Help needed climbing 3-5 steps with a railing? : A Little 6 Click Score: 21    End of Session Equipment Utilized During Treatment: Gait belt Activity Tolerance: Patient limited by pain;Patient tolerated treatment well Patient left: in chair;with call bell/phone within reach;with family/visitor present Nurse Communication: Mobility status PT Visit Diagnosis: Difficulty in walking, not elsewhere classified (R26.2);Muscle weakness (generalized) (M62.81);Pain Pain - Right/Left: Right Pain - part of body: Knee     Time: 1207-1231 PT Time Calculation (min) (ACUTE ONLY): 24 min  Charges:  $Gait Training: 8-22 mins $Therapeutic Exercise: 23-37 mins $Therapeutic Activity: 8-22 mins                    G Codes:          [name] 04/29/2017, 1:03 PM [phone]

## 2017-04-29 NOTE — Progress Notes (Signed)
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 Stair training completed. Pt ambulated without KI today with no buckling of knee. Will return to complete review of HEP after pain medication takes effect as pt was not tolerating exercises well this morning. RN gave gabapentin during PT session.    Follow Up Recommendations  Follow surgeon's recommendation for DC plan and follow-up therapies(HHPT per pt)     Equipment Recommendations  None recommended by PT    Recommendations for Other Services       Precautions / Restrictions Precautions Precautions: Knee Precaution Comments: no pillow under knee Restrictions Weight Bearing Restrictions: No Other Position/Activity Restrictions: WBAT    Mobility  Bed Mobility Overal bed mobility: Modified Independent Bed Mobility: Supine to Sit     Supine to sit: Modified independent (Device/Increase time)     General bed mobility comments: HOB up 25*  Transfers Overall transfer level: Needs assistance Equipment used: Rolling walker (2 wheeled) Transfers: Sit to/from Stand Sit to Stand: Min guard         General transfer comment: good hand placement  Ambulation/Gait Ambulation/Gait assistance: Supervision Ambulation Distance (Feet): 85 Feet Assistive device: Rolling walker (2 wheeled) Gait Pattern/deviations: Step-to pattern;Decreased step length - right;Decreased step length - left   Gait velocity interpretation: Below normal speed for age/gender General Gait Details:  distance limited by pain in R knee, didn't use KI today, no buckling   Stairs Stairs: Yes   Stair Management: No rails;Backwards;Step to pattern;With walker Number of Stairs: 1 General stair comments: VCs sequencing, min A to  manage RW  Wheelchair Mobility    Modified Rankin (Stroke Patients Only)       Balance Overall balance assessment: Modified Independent                                          Cognition Arousal/Alertness: Awake/alert Behavior During Therapy: WFL for tasks assessed/performed Overall Cognitive Status: Within Functional Limits for tasks assessed                                        Exercises Total Joint Exercises Ankle Circles/Pumps: AROM;Both;10 reps;Supine Quad Sets: AROM;Both;5 reps;Supine Heel Slides: AAROM;Supine;Both;5 reps    General Comments        Pertinent Vitals/Pain Pain Score: 8  Pain Location:  R knee with walking Pain Descriptors / Indicators: Burning Pain Intervention(s): Limited activity within patient's tolerance;Monitored during session;Premedicated before session;Patient requesting pain meds-RN notified;Ice applied    Home Living                      Prior Function            PT Goals (current goals can now be found in the care plan section) Acute Rehab PT Goals Patient Stated Goal: to walk and ride exercise bike PT Goal Formulation: With patient Time For Goal Achievement: 05/05/17 Potential to Achieve Goals: Good Progress towards PT goals: Progressing toward goals    Frequency    7X/week      PT Plan Current plan remains appropriate  Co-evaluation              AM-PAC PT "6 Clicks" Daily Activity  Outcome Measure  Difficulty turning over in bed (including adjusting bedclothes, sheets and blankets)?: None Difficulty moving from lying on back to sitting on the side of the bed? : A Little Difficulty sitting down on and standing up from a chair with arms (e.g., wheelchair, bedside commode, etc,.)?: A Little Help needed moving to and from a bed to chair (including a wheelchair)?: None Help needed walking in hospital room?: None Help needed climbing 3-5 steps with a railing? : A  Little 6 Click Score: 21    End of Session Equipment Utilized During Treatment: Gait belt Activity Tolerance: Patient limited by pain;Patient tolerated treatment well Patient left: in chair;with call bell/phone within reach Nurse Communication: Mobility status PT Visit Diagnosis: Difficulty in walking, not elsewhere classified (R26.2);Muscle weakness (generalized) (M62.81);Pain Pain - Right/Left: Right Pain - part of body: Knee     Time: 6226-3335 PT Time Calculation (min) (ACUTE ONLY): 38 min  Charges:  $Gait Training: 8-22 mins $Therapeutic Exercise: 8-22 mins $Therapeutic Activity: 8-22 mins                    G Codes:          [name] 04/29/2017, 10:24 AM [phone]

## 2017-04-29 NOTE — Progress Notes (Signed)
Patient discharged to home with family. Given all belongings, instructions, prescriptions. Patient and caregiver both present for instructions and verbalized understanding. Escorted to pov via w/c.

## 2017-04-30 DIAGNOSIS — Z79891 Long term (current) use of opiate analgesic: Secondary | ICD-10-CM | POA: Diagnosis not present

## 2017-04-30 DIAGNOSIS — M25862 Other specified joint disorders, left knee: Secondary | ICD-10-CM | POA: Diagnosis not present

## 2017-04-30 DIAGNOSIS — F419 Anxiety disorder, unspecified: Secondary | ICD-10-CM | POA: Diagnosis not present

## 2017-04-30 DIAGNOSIS — E559 Vitamin D deficiency, unspecified: Secondary | ICD-10-CM | POA: Diagnosis not present

## 2017-04-30 DIAGNOSIS — I10 Essential (primary) hypertension: Secondary | ICD-10-CM | POA: Diagnosis not present

## 2017-04-30 DIAGNOSIS — Z96651 Presence of right artificial knee joint: Secondary | ICD-10-CM | POA: Diagnosis not present

## 2017-04-30 DIAGNOSIS — Z9884 Bariatric surgery status: Secondary | ICD-10-CM | POA: Diagnosis not present

## 2017-04-30 DIAGNOSIS — F329 Major depressive disorder, single episode, unspecified: Secondary | ICD-10-CM | POA: Diagnosis not present

## 2017-04-30 DIAGNOSIS — Z9181 History of falling: Secondary | ICD-10-CM | POA: Diagnosis not present

## 2017-04-30 DIAGNOSIS — Z471 Aftercare following joint replacement surgery: Secondary | ICD-10-CM | POA: Diagnosis not present

## 2017-04-30 DIAGNOSIS — Z7901 Long term (current) use of anticoagulants: Secondary | ICD-10-CM | POA: Diagnosis not present

## 2017-05-02 DIAGNOSIS — M25862 Other specified joint disorders, left knee: Secondary | ICD-10-CM | POA: Diagnosis not present

## 2017-05-02 DIAGNOSIS — Z7901 Long term (current) use of anticoagulants: Secondary | ICD-10-CM | POA: Diagnosis not present

## 2017-05-02 DIAGNOSIS — F419 Anxiety disorder, unspecified: Secondary | ICD-10-CM | POA: Diagnosis not present

## 2017-05-02 DIAGNOSIS — E559 Vitamin D deficiency, unspecified: Secondary | ICD-10-CM | POA: Diagnosis not present

## 2017-05-02 DIAGNOSIS — Z79891 Long term (current) use of opiate analgesic: Secondary | ICD-10-CM | POA: Diagnosis not present

## 2017-05-02 DIAGNOSIS — Z471 Aftercare following joint replacement surgery: Secondary | ICD-10-CM | POA: Diagnosis not present

## 2017-05-02 DIAGNOSIS — Z96651 Presence of right artificial knee joint: Secondary | ICD-10-CM | POA: Diagnosis not present

## 2017-05-02 DIAGNOSIS — Z9884 Bariatric surgery status: Secondary | ICD-10-CM | POA: Diagnosis not present

## 2017-05-02 DIAGNOSIS — Z9181 History of falling: Secondary | ICD-10-CM | POA: Diagnosis not present

## 2017-05-02 DIAGNOSIS — F329 Major depressive disorder, single episode, unspecified: Secondary | ICD-10-CM | POA: Diagnosis not present

## 2017-05-02 DIAGNOSIS — I10 Essential (primary) hypertension: Secondary | ICD-10-CM | POA: Diagnosis not present

## 2017-05-04 DIAGNOSIS — F419 Anxiety disorder, unspecified: Secondary | ICD-10-CM | POA: Diagnosis not present

## 2017-05-04 DIAGNOSIS — Z79891 Long term (current) use of opiate analgesic: Secondary | ICD-10-CM | POA: Diagnosis not present

## 2017-05-04 DIAGNOSIS — E559 Vitamin D deficiency, unspecified: Secondary | ICD-10-CM | POA: Diagnosis not present

## 2017-05-04 DIAGNOSIS — Z9884 Bariatric surgery status: Secondary | ICD-10-CM | POA: Diagnosis not present

## 2017-05-04 DIAGNOSIS — Z471 Aftercare following joint replacement surgery: Secondary | ICD-10-CM | POA: Diagnosis not present

## 2017-05-04 DIAGNOSIS — I10 Essential (primary) hypertension: Secondary | ICD-10-CM | POA: Diagnosis not present

## 2017-05-04 DIAGNOSIS — M25862 Other specified joint disorders, left knee: Secondary | ICD-10-CM | POA: Diagnosis not present

## 2017-05-04 DIAGNOSIS — Z96651 Presence of right artificial knee joint: Secondary | ICD-10-CM | POA: Diagnosis not present

## 2017-05-04 DIAGNOSIS — Z7901 Long term (current) use of anticoagulants: Secondary | ICD-10-CM | POA: Diagnosis not present

## 2017-05-04 DIAGNOSIS — F329 Major depressive disorder, single episode, unspecified: Secondary | ICD-10-CM | POA: Diagnosis not present

## 2017-05-04 DIAGNOSIS — Z9181 History of falling: Secondary | ICD-10-CM | POA: Diagnosis not present

## 2017-05-06 DIAGNOSIS — F329 Major depressive disorder, single episode, unspecified: Secondary | ICD-10-CM | POA: Diagnosis not present

## 2017-05-06 DIAGNOSIS — Z9181 History of falling: Secondary | ICD-10-CM | POA: Diagnosis not present

## 2017-05-06 DIAGNOSIS — E559 Vitamin D deficiency, unspecified: Secondary | ICD-10-CM | POA: Diagnosis not present

## 2017-05-06 DIAGNOSIS — M25862 Other specified joint disorders, left knee: Secondary | ICD-10-CM | POA: Diagnosis not present

## 2017-05-06 DIAGNOSIS — Z96651 Presence of right artificial knee joint: Secondary | ICD-10-CM | POA: Diagnosis not present

## 2017-05-06 DIAGNOSIS — F419 Anxiety disorder, unspecified: Secondary | ICD-10-CM | POA: Diagnosis not present

## 2017-05-06 DIAGNOSIS — I10 Essential (primary) hypertension: Secondary | ICD-10-CM | POA: Diagnosis not present

## 2017-05-06 DIAGNOSIS — Z79891 Long term (current) use of opiate analgesic: Secondary | ICD-10-CM | POA: Diagnosis not present

## 2017-05-06 DIAGNOSIS — Z7901 Long term (current) use of anticoagulants: Secondary | ICD-10-CM | POA: Diagnosis not present

## 2017-05-06 DIAGNOSIS — Z471 Aftercare following joint replacement surgery: Secondary | ICD-10-CM | POA: Diagnosis not present

## 2017-05-06 DIAGNOSIS — Z9884 Bariatric surgery status: Secondary | ICD-10-CM | POA: Diagnosis not present

## 2017-05-08 DIAGNOSIS — F419 Anxiety disorder, unspecified: Secondary | ICD-10-CM | POA: Diagnosis not present

## 2017-05-08 DIAGNOSIS — Z471 Aftercare following joint replacement surgery: Secondary | ICD-10-CM | POA: Diagnosis not present

## 2017-05-08 DIAGNOSIS — Z9181 History of falling: Secondary | ICD-10-CM | POA: Diagnosis not present

## 2017-05-08 DIAGNOSIS — Z7901 Long term (current) use of anticoagulants: Secondary | ICD-10-CM | POA: Diagnosis not present

## 2017-05-08 DIAGNOSIS — E559 Vitamin D deficiency, unspecified: Secondary | ICD-10-CM | POA: Diagnosis not present

## 2017-05-08 DIAGNOSIS — F329 Major depressive disorder, single episode, unspecified: Secondary | ICD-10-CM | POA: Diagnosis not present

## 2017-05-08 DIAGNOSIS — Z96651 Presence of right artificial knee joint: Secondary | ICD-10-CM | POA: Diagnosis not present

## 2017-05-08 DIAGNOSIS — Z79891 Long term (current) use of opiate analgesic: Secondary | ICD-10-CM | POA: Diagnosis not present

## 2017-05-08 DIAGNOSIS — I10 Essential (primary) hypertension: Secondary | ICD-10-CM | POA: Diagnosis not present

## 2017-05-08 DIAGNOSIS — M25862 Other specified joint disorders, left knee: Secondary | ICD-10-CM | POA: Diagnosis not present

## 2017-05-08 DIAGNOSIS — Z9884 Bariatric surgery status: Secondary | ICD-10-CM | POA: Diagnosis not present

## 2017-05-11 DIAGNOSIS — M25862 Other specified joint disorders, left knee: Secondary | ICD-10-CM | POA: Diagnosis not present

## 2017-05-11 DIAGNOSIS — E559 Vitamin D deficiency, unspecified: Secondary | ICD-10-CM | POA: Diagnosis not present

## 2017-05-11 DIAGNOSIS — Z79891 Long term (current) use of opiate analgesic: Secondary | ICD-10-CM | POA: Diagnosis not present

## 2017-05-11 DIAGNOSIS — Z96651 Presence of right artificial knee joint: Secondary | ICD-10-CM | POA: Diagnosis not present

## 2017-05-11 DIAGNOSIS — Z9181 History of falling: Secondary | ICD-10-CM | POA: Diagnosis not present

## 2017-05-11 DIAGNOSIS — F419 Anxiety disorder, unspecified: Secondary | ICD-10-CM | POA: Diagnosis not present

## 2017-05-11 DIAGNOSIS — Z471 Aftercare following joint replacement surgery: Secondary | ICD-10-CM | POA: Diagnosis not present

## 2017-05-11 DIAGNOSIS — F329 Major depressive disorder, single episode, unspecified: Secondary | ICD-10-CM | POA: Diagnosis not present

## 2017-05-11 DIAGNOSIS — I10 Essential (primary) hypertension: Secondary | ICD-10-CM | POA: Diagnosis not present

## 2017-05-11 DIAGNOSIS — Z7901 Long term (current) use of anticoagulants: Secondary | ICD-10-CM | POA: Diagnosis not present

## 2017-05-11 DIAGNOSIS — Z9884 Bariatric surgery status: Secondary | ICD-10-CM | POA: Diagnosis not present

## 2017-05-12 DIAGNOSIS — G4733 Obstructive sleep apnea (adult) (pediatric): Secondary | ICD-10-CM | POA: Diagnosis not present

## 2017-05-13 DIAGNOSIS — Z9181 History of falling: Secondary | ICD-10-CM | POA: Diagnosis not present

## 2017-05-13 DIAGNOSIS — F419 Anxiety disorder, unspecified: Secondary | ICD-10-CM | POA: Diagnosis not present

## 2017-05-13 DIAGNOSIS — Z96651 Presence of right artificial knee joint: Secondary | ICD-10-CM | POA: Diagnosis not present

## 2017-05-13 DIAGNOSIS — F329 Major depressive disorder, single episode, unspecified: Secondary | ICD-10-CM | POA: Diagnosis not present

## 2017-05-13 DIAGNOSIS — Z9884 Bariatric surgery status: Secondary | ICD-10-CM | POA: Diagnosis not present

## 2017-05-13 DIAGNOSIS — Z79891 Long term (current) use of opiate analgesic: Secondary | ICD-10-CM | POA: Diagnosis not present

## 2017-05-13 DIAGNOSIS — E559 Vitamin D deficiency, unspecified: Secondary | ICD-10-CM | POA: Diagnosis not present

## 2017-05-13 DIAGNOSIS — M25862 Other specified joint disorders, left knee: Secondary | ICD-10-CM | POA: Diagnosis not present

## 2017-05-13 DIAGNOSIS — Z7901 Long term (current) use of anticoagulants: Secondary | ICD-10-CM | POA: Diagnosis not present

## 2017-05-13 DIAGNOSIS — Z471 Aftercare following joint replacement surgery: Secondary | ICD-10-CM | POA: Diagnosis not present

## 2017-05-13 DIAGNOSIS — I10 Essential (primary) hypertension: Secondary | ICD-10-CM | POA: Diagnosis not present

## 2017-05-19 DIAGNOSIS — M25562 Pain in left knee: Secondary | ICD-10-CM | POA: Diagnosis not present

## 2017-05-19 DIAGNOSIS — M25561 Pain in right knee: Secondary | ICD-10-CM | POA: Diagnosis not present

## 2017-05-21 DIAGNOSIS — M25561 Pain in right knee: Secondary | ICD-10-CM | POA: Diagnosis not present

## 2017-05-21 DIAGNOSIS — M25562 Pain in left knee: Secondary | ICD-10-CM | POA: Diagnosis not present

## 2017-05-26 DIAGNOSIS — M25562 Pain in left knee: Secondary | ICD-10-CM | POA: Diagnosis not present

## 2017-05-26 DIAGNOSIS — M25561 Pain in right knee: Secondary | ICD-10-CM | POA: Diagnosis not present

## 2017-05-28 DIAGNOSIS — M25562 Pain in left knee: Secondary | ICD-10-CM | POA: Diagnosis not present

## 2017-05-28 DIAGNOSIS — M25561 Pain in right knee: Secondary | ICD-10-CM | POA: Diagnosis not present

## 2017-06-02 DIAGNOSIS — M25561 Pain in right knee: Secondary | ICD-10-CM | POA: Diagnosis not present

## 2017-06-02 DIAGNOSIS — M25562 Pain in left knee: Secondary | ICD-10-CM | POA: Diagnosis not present

## 2017-06-02 DIAGNOSIS — Z471 Aftercare following joint replacement surgery: Secondary | ICD-10-CM | POA: Diagnosis not present

## 2017-06-02 DIAGNOSIS — Z96651 Presence of right artificial knee joint: Secondary | ICD-10-CM | POA: Diagnosis not present

## 2017-07-01 DIAGNOSIS — H43812 Vitreous degeneration, left eye: Secondary | ICD-10-CM | POA: Diagnosis not present

## 2017-08-12 DIAGNOSIS — F4323 Adjustment disorder with mixed anxiety and depressed mood: Secondary | ICD-10-CM | POA: Diagnosis not present

## 2017-08-12 DIAGNOSIS — Z01419 Encounter for gynecological examination (general) (routine) without abnormal findings: Secondary | ICD-10-CM | POA: Diagnosis not present

## 2017-08-20 DIAGNOSIS — H43812 Vitreous degeneration, left eye: Secondary | ICD-10-CM | POA: Diagnosis not present

## 2017-08-21 DIAGNOSIS — G4733 Obstructive sleep apnea (adult) (pediatric): Secondary | ICD-10-CM | POA: Diagnosis not present

## 2017-08-21 DIAGNOSIS — E669 Obesity, unspecified: Secondary | ICD-10-CM | POA: Diagnosis not present

## 2017-08-21 DIAGNOSIS — R7303 Prediabetes: Secondary | ICD-10-CM | POA: Diagnosis not present

## 2017-08-21 DIAGNOSIS — I10 Essential (primary) hypertension: Secondary | ICD-10-CM | POA: Diagnosis not present

## 2017-08-21 DIAGNOSIS — Z9884 Bariatric surgery status: Secondary | ICD-10-CM | POA: Diagnosis not present

## 2017-08-21 DIAGNOSIS — F329 Major depressive disorder, single episode, unspecified: Secondary | ICD-10-CM | POA: Diagnosis not present

## 2017-08-21 DIAGNOSIS — Z1321 Encounter for screening for nutritional disorder: Secondary | ICD-10-CM | POA: Diagnosis not present

## 2017-08-21 DIAGNOSIS — Z9989 Dependence on other enabling machines and devices: Secondary | ICD-10-CM | POA: Diagnosis not present

## 2017-08-28 DIAGNOSIS — Z1231 Encounter for screening mammogram for malignant neoplasm of breast: Secondary | ICD-10-CM | POA: Diagnosis not present

## 2017-09-02 DIAGNOSIS — Z780 Asymptomatic menopausal state: Secondary | ICD-10-CM | POA: Diagnosis not present

## 2017-09-02 DIAGNOSIS — Z13820 Encounter for screening for osteoporosis: Secondary | ICD-10-CM | POA: Diagnosis not present

## 2017-09-02 LAB — HM DEXA SCAN: HM Dexa Scan: NORMAL

## 2017-09-29 DIAGNOSIS — G4733 Obstructive sleep apnea (adult) (pediatric): Secondary | ICD-10-CM | POA: Diagnosis not present

## 2018-01-15 DIAGNOSIS — G4733 Obstructive sleep apnea (adult) (pediatric): Secondary | ICD-10-CM | POA: Diagnosis not present

## 2018-01-15 DIAGNOSIS — I10 Essential (primary) hypertension: Secondary | ICD-10-CM | POA: Diagnosis not present

## 2018-01-15 DIAGNOSIS — K228 Other specified diseases of esophagus: Secondary | ICD-10-CM | POA: Diagnosis not present

## 2018-01-15 DIAGNOSIS — K2270 Barrett's esophagus without dysplasia: Secondary | ICD-10-CM | POA: Diagnosis not present

## 2018-01-15 DIAGNOSIS — Z9884 Bariatric surgery status: Secondary | ICD-10-CM | POA: Diagnosis not present

## 2018-01-15 DIAGNOSIS — K2930 Chronic superficial gastritis without bleeding: Secondary | ICD-10-CM | POA: Diagnosis not present

## 2018-01-15 DIAGNOSIS — M419 Scoliosis, unspecified: Secondary | ICD-10-CM | POA: Diagnosis not present

## 2018-01-15 DIAGNOSIS — R7303 Prediabetes: Secondary | ICD-10-CM | POA: Diagnosis not present

## 2018-01-15 DIAGNOSIS — Z96652 Presence of left artificial knee joint: Secondary | ICD-10-CM | POA: Diagnosis not present

## 2018-01-15 DIAGNOSIS — Z9989 Dependence on other enabling machines and devices: Secondary | ICD-10-CM | POA: Diagnosis not present

## 2018-01-15 DIAGNOSIS — Z6834 Body mass index (BMI) 34.0-34.9, adult: Secondary | ICD-10-CM | POA: Diagnosis not present

## 2018-02-02 DIAGNOSIS — G4733 Obstructive sleep apnea (adult) (pediatric): Secondary | ICD-10-CM | POA: Diagnosis not present

## 2018-02-04 ENCOUNTER — Ambulatory Visit: Payer: BLUE CROSS/BLUE SHIELD | Admitting: Internal Medicine

## 2018-02-04 ENCOUNTER — Encounter: Payer: Self-pay | Admitting: Internal Medicine

## 2018-02-04 VITALS — BP 122/76 | HR 70 | Temp 99.5°F | Wt 212.0 lb

## 2018-02-04 DIAGNOSIS — R6889 Other general symptoms and signs: Secondary | ICD-10-CM

## 2018-02-04 MED ORDER — HYDROCOD POLST-CPM POLST ER 10-8 MG/5ML PO SUER: 5.0000 mL | Freq: Every evening | ORAL | 0 refills | Status: DC | PRN

## 2018-02-04 NOTE — Patient Instructions (Signed)

## 2018-02-04 NOTE — Progress Notes (Signed)
HPI  Pt presents to the clinic today with c/o runny nose, ear fullness, sore throat and cough. She reports this started 4-5 days ago. She is blowing clear mucous out of her nose. She denies difficulty swallowing. The cough is non productive. She has run fever up to 102.4 at home, had chills and body aches. She has tried Mucinex, Tylenol PM, Airborne with minimal relief. She has not had sick contacts. She did get her flu shot.  Review of Systems      Past Medical History:  Diagnosis Date  . Anxiety   . Arthritis    hands, knees, back  . Depression   . Edema    left leg knee to foot, since veein stripping  . History of chicken pox   . History of hiatal hernia   . Hypertension   . PONV (postoperative nausea and vomiting)    PONV after knee arthroplasty  . Scoliosis    sees chiropracter monthly  . Shortness of breath dyspnea    "out of shape"  . Sleep apnea    cpap    Family History  Problem Relation Age of Onset  . Arthritis Father   . Cancer Father        prostate CA  . Hypertension Father   . Diabetes Brother   . Arthritis Mother   . Hypertension Mother   . Cancer Paternal Aunt        breast cancer  . Arthritis Maternal Grandmother   . Hypertension Maternal Grandmother   . Arthritis Maternal Grandfather   . Hypertension Maternal Grandfather   . Diabetes Maternal Grandfather     Social History   Socioeconomic History  . Marital status: Divorced    Spouse name: Not on file  . Number of children: Not on file  . Years of education: Not on file  . Highest education level: Not on file  Occupational History  . Not on file  Social Needs  . Financial resource strain: Not on file  . Food insecurity:    Worry: Not on file    Inability: Not on file  . Transportation needs:    Medical: Not on file    Non-medical: Not on file  Tobacco Use  . Smoking status: Never Smoker  . Smokeless tobacco: Never Used  Substance and Sexual Activity  . Alcohol use: No     Comment: rare  . Drug use: No  . Sexual activity: Never  Lifestyle  . Physical activity:    Days per week: Not on file    Minutes per session: Not on file  . Stress: Not on file  Relationships  . Social connections:    Talks on phone: Not on file    Gets together: Not on file    Attends religious service: Not on file    Active member of club or organization: Not on file    Attends meetings of clubs or organizations: Not on file    Relationship status: Not on file  . Intimate partner violence:    Fear of current or ex partner: Not on file    Emotionally abused: Not on file    Physically abused: Not on file    Forced sexual activity: Not on file  Other Topics Concern  . Not on file  Social History Narrative  . Not on file    No Known Allergies   Constitutional: Positive fatigue and fever. Denies headache, abrupt weight changes.  HEENT:  Positive runny nose, ear  fullness, sore throat. Denies eye redness, eye pain, pressure behind the eyes, facial pain, nasal congestion, ear pain, ringing in the ears, wax buildup, or bloody nose. Respiratory: Positive cough. Denies difficulty breathing or shortness of breath.  Cardiovascular: Denies chest pain, chest tightness, palpitations or swelling in the hands or feet.   No other specific complaints in a complete review of systems (except as listed in HPI above).  Objective:   BP 122/76   Pulse 70   Temp 99.5 F (37.5 C) (Oral)   Wt 212 lb (96.2 kg)   SpO2 98%   BMI 35.28 kg/m  Wt Readings from Last 3 Encounters:  02/04/18 212 lb (96.2 kg)  04/27/17 229 lb (103.9 kg)  04/20/17 229 lb (103.9 kg)     General: Appears her stated age, ill appearing but in NAD. HEENT: Head: normal shape and size, no sinus tenderness noted;  Ears: Tm's gray and intact, normal light reflex; Nose: mucosa pink and moist, septum midline; Throat/Mouth: + PND. Teeth present, mucosa erythematous and moist, no exudate noted, no lesions or ulcerations noted.   Neck: No cervical lymphadenopathy.  Cardiovascular: Normal rate and rhythm. S1,S2 noted.  No murmur, rubs or gallops noted.  Pulmonary/Chest: Normal effort and positive vesicular breath sounds. No respiratory distress. No wheezes, rales or ronchi noted.       Assessment & Plan:   Flu Like Illness:  Too far out for Tamiflu Discussed lack of value in testing at this time Get some rest and drink plenty of water Do salt water gargles for the sore throat Ibuprofen/Tylenol for fever and body aches eRx for Tussionex cough syrup  RTC as needed or if symptoms persist.   Webb Silversmith, NP

## 2018-03-24 DIAGNOSIS — R8279 Other abnormal findings on microbiological examination of urine: Secondary | ICD-10-CM | POA: Diagnosis not present

## 2018-03-24 DIAGNOSIS — N3000 Acute cystitis without hematuria: Secondary | ICD-10-CM | POA: Diagnosis not present

## 2018-04-08 DIAGNOSIS — D485 Neoplasm of uncertain behavior of skin: Secondary | ICD-10-CM | POA: Diagnosis not present

## 2018-04-08 DIAGNOSIS — D225 Melanocytic nevi of trunk: Secondary | ICD-10-CM | POA: Diagnosis not present

## 2018-04-08 DIAGNOSIS — D2262 Melanocytic nevi of left upper limb, including shoulder: Secondary | ICD-10-CM | POA: Diagnosis not present

## 2018-04-08 DIAGNOSIS — D2272 Melanocytic nevi of left lower limb, including hip: Secondary | ICD-10-CM | POA: Diagnosis not present

## 2018-04-08 DIAGNOSIS — D2261 Melanocytic nevi of right upper limb, including shoulder: Secondary | ICD-10-CM | POA: Diagnosis not present

## 2018-04-21 DIAGNOSIS — D485 Neoplasm of uncertain behavior of skin: Secondary | ICD-10-CM | POA: Diagnosis not present

## 2018-05-06 DIAGNOSIS — Z9884 Bariatric surgery status: Secondary | ICD-10-CM | POA: Diagnosis not present

## 2018-05-06 DIAGNOSIS — Z6835 Body mass index (BMI) 35.0-35.9, adult: Secondary | ICD-10-CM | POA: Diagnosis not present

## 2018-05-06 DIAGNOSIS — G4733 Obstructive sleep apnea (adult) (pediatric): Secondary | ICD-10-CM | POA: Diagnosis not present

## 2018-05-06 DIAGNOSIS — Z1321 Encounter for screening for nutritional disorder: Secondary | ICD-10-CM | POA: Diagnosis not present

## 2018-06-02 DIAGNOSIS — G4733 Obstructive sleep apnea (adult) (pediatric): Secondary | ICD-10-CM | POA: Diagnosis not present

## 2018-07-02 ENCOUNTER — Encounter: Payer: Self-pay | Admitting: Family Medicine

## 2018-07-02 ENCOUNTER — Telehealth: Payer: Self-pay

## 2018-07-02 ENCOUNTER — Other Ambulatory Visit: Payer: Self-pay

## 2018-07-02 ENCOUNTER — Ambulatory Visit: Payer: BLUE CROSS/BLUE SHIELD | Admitting: Family Medicine

## 2018-07-02 VITALS — BP 126/80 | HR 80 | Temp 98.7°F | Ht 65.0 in | Wt 216.3 lb

## 2018-07-02 DIAGNOSIS — R300 Dysuria: Secondary | ICD-10-CM | POA: Diagnosis not present

## 2018-07-02 DIAGNOSIS — N3001 Acute cystitis with hematuria: Secondary | ICD-10-CM | POA: Diagnosis not present

## 2018-07-02 DIAGNOSIS — N390 Urinary tract infection, site not specified: Secondary | ICD-10-CM | POA: Insufficient documentation

## 2018-07-02 LAB — POC URINALSYSI DIPSTICK (AUTOMATED)
Bilirubin, UA: NEGATIVE
Blood, UA: 200
Glucose, UA: NEGATIVE
Ketones, UA: NEGATIVE
Nitrite, UA: NEGATIVE
Protein, UA: POSITIVE — AB
Spec Grav, UA: 1.02 (ref 1.010–1.025)
Urobilinogen, UA: 0.2 E.U./dL
pH, UA: 6 (ref 5.0–8.0)

## 2018-07-02 MED ORDER — CEPHALEXIN 500 MG PO CAPS: 500.0000 mg | ORAL_CAPSULE | Freq: Three times a day (TID) | ORAL | 0 refills | Status: DC

## 2018-07-02 NOTE — Telephone Encounter (Signed)
Pt already has appt with Dr Glori Bickers today at 10 AM.

## 2018-07-02 NOTE — Telephone Encounter (Signed)
[facility] Night - Client Nonclinical Telephone Record [facility] Night - Client Client Site [place] Physician [name] - MD Contact Type Call Who Is Calling Patient / Member / Family / Caregiver Caller Name [place] Phone Number [phone] Patient Name Katrina Huffman Patient DOB 1955-08-26 Call Type Message Only Information Provided Reason for Call Request for General Office Information Initial Comment Caller states she has blood in her urine. She would like to have a visit with Dr Glori Bickers. Additional Comment Call Closed By: Chilton Greathouse Transaction Date/Time: 07/02/2018 7:45:10 AM (ET)

## 2018-07-02 NOTE — Patient Instructions (Addendum)
Drink lots of fluids  Take the keflex as directed We will alert you when culture result returns   If symptoms suddenly worsen please let me know    Urinary Tract Infection, Adult  A urinary tract infection (UTI) is an infection of any part of the urinary tract. The urinary tract includes the kidneys, ureters, bladder, and urethra. These organs make, store, and get rid of urine in the body. Your health care provider may use other names to describe the infection. An upper UTI affects the ureters and kidneys (pyelonephritis). A lower UTI affects the bladder (cystitis) and urethra (urethritis). What are the causes? Most urinary tract infections are caused by bacteria in your genital area, around the entrance to your urinary tract (urethra). These bacteria grow and cause inflammation of your urinary tract. What increases the risk? You are more likely to develop this condition if:  You have a urinary catheter that stays in place (indwelling).  You are not able to control when you urinate or have a bowel movement (you have incontinence).  You are female and you: ? Use a spermicide or diaphragm for birth control. ? Have low estrogen levels. ? Are pregnant.  You have certain genes that increase your risk (genetics).  You are sexually active.  You take antibiotic medicines.  You have a condition that causes your flow of urine to slow down, such as: ? An enlarged prostate, if you are female. ? Blockage in your urethra (stricture). ? A kidney stone. ? A nerve condition that affects your bladder control (neurogenic bladder). ? Not getting enough to drink, or not urinating often.  You have certain medical conditions, such as: ? Diabetes. ? A weak disease-fighting system (immunesystem). ? Sickle cell disease. ? Gout. ? Spinal cord injury. What are the signs or symptoms? Symptoms of this condition include:  Needing to urinate right away (urgently).  Frequent urination or passing small  amounts of urine frequently.  Pain or burning with urination.  Blood in the urine.  Urine that smells bad or unusual.  Trouble urinating.  Cloudy urine.  Vaginal discharge, if you are female.  Pain in the abdomen or the lower back. You may also have:  Vomiting or a decreased appetite.  Confusion.  Irritability or tiredness.  A fever.  Diarrhea. The first symptom in older adults may be confusion. In some cases, they may not have any symptoms until the infection has worsened. How is this diagnosed? This condition is diagnosed based on your medical history and a physical exam. You may also have other tests, including:  Urine tests.  Blood tests.  Tests for sexually transmitted infections (STIs). If you have had more than one UTI, a cystoscopy or imaging studies may be done to determine the cause of the infections. How is this treated? Treatment for this condition includes:  Antibiotic medicine.  Over-the-counter medicines to treat discomfort.  Drinking enough water to stay hydrated. If you have frequent infections or have other conditions such as a kidney stone, you may need to see a health care provider who specializes in the urinary tract (urologist). In rare cases, urinary tract infections can cause sepsis. Sepsis is a life-threatening condition that occurs when the body responds to an infection. Sepsis is treated in the hospital with IV antibiotics, fluids, and other medicines. Follow these instructions at home:  Medicines  Take over-the-counter and prescription medicines only as told by your health care provider.  If you were prescribed an antibiotic medicine, take it as  told by your health care provider. Do not stop using the antibiotic even if you start to feel better. General instructions  Make sure you: ? Empty your bladder often and completely. Do not hold urine for long periods of time. ? Empty your bladder after sex. ? Wipe from front to back after a  bowel movement if you are female. Use each tissue one time when you wipe.  Drink enough fluid to keep your urine pale yellow.  Keep all follow-up visits as told by your health care provider. This is important. Contact a health care provider if:  Your symptoms do not get better after 1-2 days.  Your symptoms go away and then return. Get help right away if you have:  Severe pain in your back or your lower abdomen.  A fever.  Nausea or vomiting. Summary  A urinary tract infection (UTI) is an infection of any part of the urinary tract, which includes the kidneys, ureters, bladder, and urethra.  Most urinary tract infections are caused by bacteria in your genital area, around the entrance to your urinary tract (urethra).  Treatment for this condition often includes antibiotic medicines.  If you were prescribed an antibiotic medicine, take it as told by your health care provider. Do not stop using the antibiotic even if you start to feel better.  Keep all follow-up visits as told by your health care provider. This is important. This information is not intended to replace advice given to you by your health care provider. Make sure you discuss any questions you have with your health care provider. Document Released: 10/30/2004 Document Revised: 07/30/2017 Document Reviewed: 07/30/2017 Elsevier Interactive Patient Education  2019 [employer].

## 2018-07-02 NOTE — Progress Notes (Signed)
Subjective:    Patient ID: Katrina Huffman, female    DOB: [birth year], 63 y.o.   MRN: [id number]  HPI Here for urinary symptoms   Wt Readings from Last 3 Encounters:  07/02/18 216 lb 5 oz (98.1 kg)  02/04/18 212 lb (96.2 kg)  04/27/17 229 lb (103.9 kg)   36.00 kg/m   Had a uti 3 mo ago -her first one  Saw gyn and was tx /verified with a culture    She has seen blood in urine -during the night -saw in urine  Not a lot of discomfort - just a little bladder pressure  No frequency or urgency Needs to drink more water   Perhaps too much ice tea   Results for orders placed or performed in visit on 07/02/18  POCT Urinalysis Dipstick (Automated)  Result Value Ref Range   Color, UA Brown    Clarity, UA Cloudy    Glucose, UA Negative Negative   Bilirubin, UA Negative    Ketones, UA Negative    Spec Grav, UA 1.020 1.010 - 1.025   Blood, UA 200 Ery/uL    pH, UA 6.0 5.0 - 8.0   Protein, UA Positive (A) Negative   Urobilinogen, UA 0.2 0.2 or 1.0 E.U./dL   Nitrite, UA Negative    Leukocytes, UA Small (1+) (A) Negative     Patient Active Problem List   Diagnosis Date Noted  . UTI (urinary tract infection) 07/02/2018  . OA (osteoarthritis) of knee 04/27/2017  . Bariatric surgery status 03/06/2017  . S/P shoulder replacement 10/16/2016  . Esophagitis 10/02/2016  . Failed total knee arthroplasty, 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  . Failed total knee arthroplasty ([place]) 04/16/2016  . Bilateral chronic knee pain 03/20/2016  . Vitamin D deficiency 03/05/2016  . Anxiety disorder 03/05/2016  . Pre-operative clearance 03/05/2016  . Essential hypertension 03/05/2016  . 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 Special screening for malignant neoplasms, colon   . Benign neoplasm of transverse colon   . Screening for lipoid disorders 06/18/2010  . Routine general medical examination at a health care facility 06/18/2010  . Obesity (BMI 30-39.9) 06/18/2010   Past  Medical History:  Diagnosis Date  . Anxiety   . Arthritis    hands, knees, back  . Depression   . Edema    left leg knee to foot, since veein stripping  . History of chicken pox   . History of hiatal hernia   . Hypertension   . PONV (postoperative nausea and vomiting)    PONV after knee arthroplasty  . Scoliosis    sees chiropracter monthly  . Shortness of breath dyspnea    "out of shape"  . Sleep apnea    cpap   Past Surgical History:  Procedure Laterality Date  . BREAST SURGERY  1999   breast biopsy  . BUNIONECTOMY  2000  . [place]   . COLONOSCOPY    . COLONOSCOPY WITH PROPOFOL N/A 04/30/2015   Procedure: COLONOSCOPY WITH PROPOFOL;  Surgeon: [name], MD;  Location: [facility];  Service: Endoscopy;  Laterality: N/A;  . DILATION AND CURETTAGE OF UTERUS     x2 last one 1/17  . JOINT REPLACEMENT     LTKA-3 yrs ago  . KNEE ARTHROSCOPY Left 05/09/2015   Procedure: ARTHROSCOPY LEFT KNEE WITH SYNOVECTOMY;  Surgeon: [name], MD;  Location: [facility];  Service: Orthopedics;  Laterality: Left;  . KNEE ARTHROSCOPY Left 04/27/2017  Procedure: Left knee arthroscopy; synovectomy;  Surgeon: [name], MD;  Location: [facility];  Service: Orthopedics;  Laterality: Left;  . KNEE SURGERY Left 2009   x 2-meniscus, debridement  . LASIK    . POLYPECTOMY  04/30/2015   Procedure: POLYPECTOMY;  Surgeon: [name], MD;  Location: [facility];  Service: Endoscopy;;  . TONSILLECTOMY AND ADENOIDECTOMY  2007  . TOTAL KNEE ARTHROPLASTY Right 04/27/2017   Procedure: RIGHT TOTAL KNEE ARTHROPLASTY;  Surgeon: [name], MD;  [facility];  Service: Orthopedics;  Laterality: Right;  . TOTAL KNEE ARTHROPLASTY WITH REVISION COMPONENTS Left 04/16/2016   Procedure: LEFT TOTAL KNEE ARTHROPLASTY WITH POLY REVISION;  Surgeon: [name], MD;  [facility];  Service: Orthopedics;  Laterality: Left;  with abductor block  . TOTAL SHOULDER ARTHROPLASTY Right  10/16/2016  . TOTAL SHOULDER ARTHROPLASTY Right 10/16/2016   Procedure: TOTAL SHOULDER ARTHROPLASTY;  Surgeon: [name], MD;  Location: [facility];  Service: Orthopedics;  Laterality: Right;  . [place]  . VEIN LIGATION AND STRIPPING  2000   Social History   Tobacco Use  . Smoking status: Never Smoker  . Smokeless tobacco: Never Used  Substance Use Topics  . Alcohol use: No    Comment: rare  . Drug use: No   Family History  Problem Relation Age of Onset  . Arthritis Father   . Cancer Father        prostate CA  . Hypertension Father   . Diabetes Brother   . Arthritis Mother   . Hypertension Mother   . Cancer Paternal Aunt        breast cancer  . Arthritis Maternal Grandmother   . Hypertension Maternal Grandmother   . Arthritis Maternal Grandfather   . Hypertension Maternal Grandfather   . Diabetes Maternal Grandfather    No Known Allergies Current Outpatient Medications on File Prior to Visit  Medication Sig Dispense Refill  . ALPRAZolam (XANAX) 0.5 MG tablet Take 0.5 mg by mouth daily as needed for anxiety.    . Calcium 500-100 MG-UNIT CHEW Chew by mouth.    . Cholecalciferol (VITAMIN D) 50 MCG (2000 UT) tablet Take 4,000 Units by mouth daily.    . citalopram (CELEXA) 20 MG tablet Take 20 mg by mouth daily.      . meloxicam (MOBIC) 15 MG tablet      No current facility-administered medications on file prior to visit.     Review of Systems  Constitutional: Positive for fatigue. Negative for activity change, appetite change, chills and fever.  HENT: Negative for congestion and sore throat.   Eyes: Negative for itching and visual disturbance.  Respiratory: Negative for cough and shortness of breath.   Cardiovascular: Negative for leg swelling.  Gastrointestinal: Negative for abdominal distention, abdominal pain, constipation, diarrhea and nausea.  Endocrine: Negative for cold intolerance and polydipsia.  Genitourinary: Positive for hematuria and  pelvic pain. Negative for decreased urine volume, difficulty urinating, dysuria, flank pain, frequency and urgency.       Bladder pressure   Musculoskeletal: Negative for myalgias.  Skin: Negative for rash.  Allergic/Immunologic: Negative for immunocompromised state.  Neurological: Negative for dizziness and weakness.  Hematological: Negative for adenopathy.       Objective:   Physical Exam Constitutional:      General: She is not in acute distress.    Appearance: Normal appearance. She is well-developed. She is obese. She is not ill-appearing.  HENT:     Head:  Normocephalic and atraumatic.     Mouth/Throat:     Mouth: Mucous membranes are moist.     Pharynx: Oropharynx is clear.  Eyes:     Conjunctiva/sclera: Conjunctivae normal.     Pupils: Pupils are equal, round, and reactive to light.  Neck:     Musculoskeletal: Normal range of motion and neck supple.  Cardiovascular:     Rate and Rhythm: Normal rate and regular rhythm.     Heart sounds: Normal heart sounds.  Pulmonary:     Effort: Pulmonary effort is normal.     Breath sounds: Normal breath sounds.  Abdominal:     General: Bowel sounds are normal. There is no distension.     Palpations: Abdomen is soft.     Tenderness: There is abdominal tenderness. There is no rebound.     Comments: No cva tenderness  Mild suprapubic tenderness  Lymphadenopathy:     Cervical: No cervical adenopathy.  Skin:    Findings: No erythema or rash.  Neurological:     Mental Status: She is alert.  Psychiatric:        Mood and Affect: Mood normal.           Assessment & Plan:   Problem List Items Addressed This Visit      Genitourinary   UTI (urinary tract infection) - Primary    With hematuria  UA -blood and some leukocytes tx with keflex  Pend cx result  Enc fluids Handout given      Relevant Medications   cephALEXin (KEFLEX) 500 MG capsule   Other Relevant Orders   Urine Culture (Completed)    Other Visit  Diagnoses    Dysuria       Relevant Orders   POCT Urinalysis Dipstick (Automated) (Completed)

## 2018-07-03 LAB — URINE CULTURE
MICRO NUMBER:: 518977
SPECIMEN QUALITY:: ADEQUATE

## 2018-07-04 NOTE — Assessment & Plan Note (Signed)
With hematuria  UA -blood and some leukocytes tx with keflex  Pend cx result  Enc fluids Handout given

## 2018-07-07 ENCOUNTER — Telehealth: Payer: Self-pay | Admitting: Family Medicine

## 2018-07-07 DIAGNOSIS — R319 Hematuria, unspecified: Secondary | ICD-10-CM | POA: Insufficient documentation

## 2018-07-07 DIAGNOSIS — R310 Gross hematuria: Secondary | ICD-10-CM

## 2018-07-07 NOTE — Telephone Encounter (Signed)
Appt scheduled with Dr Matilde Sprang, patietn aware.

## 2018-07-07 NOTE — Telephone Encounter (Signed)
Pt had appt on 07/02/18.

## 2018-07-07 NOTE — Telephone Encounter (Signed)
-----   Message from [place], [place] sent at 07/07/2018 12:03 PM EDT ----- Pt agrees with referral to urologist. She would like to see someone in [place] if possible but she said the sooner the better so she will go where every they can see her at 1st. Please put referral in and I advise pt our [facility] will call her to schedule appt

## 2018-07-07 NOTE — Telephone Encounter (Signed)
Ref done  Will route to [facility] 

## 2018-07-15 DIAGNOSIS — R350 Frequency of micturition: Secondary | ICD-10-CM | POA: Diagnosis not present

## 2018-07-15 DIAGNOSIS — R310 Gross hematuria: Secondary | ICD-10-CM | POA: Diagnosis not present

## 2018-07-21 DIAGNOSIS — N200 Calculus of kidney: Secondary | ICD-10-CM | POA: Diagnosis not present

## 2018-07-21 DIAGNOSIS — R310 Gross hematuria: Secondary | ICD-10-CM | POA: Diagnosis not present

## 2018-07-23 DIAGNOSIS — N200 Calculus of kidney: Secondary | ICD-10-CM | POA: Diagnosis not present

## 2018-07-23 DIAGNOSIS — R310 Gross hematuria: Secondary | ICD-10-CM | POA: Diagnosis not present

## 2018-07-23 DIAGNOSIS — R8271 Bacteriuria: Secondary | ICD-10-CM | POA: Diagnosis not present

## 2018-08-04 DIAGNOSIS — L821 Other seborrheic keratosis: Secondary | ICD-10-CM | POA: Diagnosis not present

## 2018-08-04 DIAGNOSIS — D225 Melanocytic nevi of trunk: Secondary | ICD-10-CM | POA: Diagnosis not present

## 2018-08-04 DIAGNOSIS — Z872 Personal history of diseases of the skin and subcutaneous tissue: Secondary | ICD-10-CM | POA: Diagnosis not present

## 2018-08-04 DIAGNOSIS — Z09 Encounter for follow-up examination after completed treatment for conditions other than malignant neoplasm: Secondary | ICD-10-CM | POA: Diagnosis not present

## 2018-08-17 DIAGNOSIS — Z01419 Encounter for gynecological examination (general) (routine) without abnormal findings: Secondary | ICD-10-CM | POA: Diagnosis not present

## 2018-08-17 DIAGNOSIS — Z124 Encounter for screening for malignant neoplasm of cervix: Secondary | ICD-10-CM | POA: Diagnosis not present

## 2018-12-08 DIAGNOSIS — Z1231 Encounter for screening mammogram for malignant neoplasm of breast: Secondary | ICD-10-CM | POA: Diagnosis not present

## 2018-12-08 LAB — HM MAMMOGRAPHY

## 2018-12-23 ENCOUNTER — Other Ambulatory Visit: Payer: Self-pay

## 2018-12-23 ENCOUNTER — Encounter: Payer: Self-pay | Admitting: Family Medicine

## 2018-12-23 ENCOUNTER — Ambulatory Visit (INDEPENDENT_AMBULATORY_CARE_PROVIDER_SITE_OTHER): Payer: BLUE CROSS/BLUE SHIELD | Admitting: Family Medicine

## 2018-12-23 DIAGNOSIS — Z20828 Contact with and (suspected) exposure to other viral communicable diseases: Secondary | ICD-10-CM | POA: Diagnosis not present

## 2018-12-23 DIAGNOSIS — R509 Fever, unspecified: Secondary | ICD-10-CM

## 2018-12-23 DIAGNOSIS — N12 Tubulo-interstitial nephritis, not specified as acute or chronic: Secondary | ICD-10-CM | POA: Diagnosis not present

## 2018-12-23 NOTE — Assessment & Plan Note (Signed)
Temp of 104.2 last night with chills and aches  100.3 now - improved (without tylenol) Pt experienced some L sided back pain yesterday -thinking it was a kidney stone Now general low back pain  No urinary symptoms No respiratory symptoms   I feel given the fever she needs to be evaluated and she cannot come here  We discussed testing for covid and then isolation  She plans to go to her urgent care for eval (likely covid testing and urine testing)  Enc tylenol for symptoms  Rest/isolation  Will update [place] with results

## 2018-12-23 NOTE — Progress Notes (Signed)
Virtual Visit via Video Note  I connected with Katrina Huffman on 12/23/18 at  8:30 AM EST by a video enabled telemedicine application and verified that I am speaking with the correct person using two identifiers.  Location: Patient: home Provider: office   Parties involved in encounter   Patient: Katrina Huffman Treating physician: [name] MD    I discussed the limitations of evaluation and management by telemedicine and the availability of in person appointments. The patient expressed understanding and agreed to proceed.  History of Present Illness: Pt presents with fever and back pain   Tuesday she was at work  Became uncomfortable- felt like a kidney stone  Pain in her back - felt crampy/ on the L  Took some tylenol and did improve  Lower back hurts today- in the middle -very low  No side or abd pain  A few times she felt nauseated  No appetite  No longer feels like she has to keep moving   Was told at urology she had kidney stones in both kidneys   Had one attack in the past- bad pain/ brief with nausea   Went home and she was tired/out of it  Slept for a long time   Last night- woke up because she had violent cold chills - lasted for 3 hours  104.2 temp   Temp: 100.3 F (37.9 C)(pt reported)  Feels quite a bit better  Took tylenol last at bedtme  Drinking lots of fluids   No urinary symptoms No visible blood in urine   No runny nose or cough  No loss of taste or smell  No diarrhea  No abd pain  No longer nausea   No sick exposures known No covid exp  She is exp to co workers  She does not wear mask at work   Patient Active Problem List   Diagnosis Date Noted  . Fever 12/23/2018  . Hematuria 07/07/2018  . UTI (urinary tract infection) 07/02/2018  . OA (osteoarthritis) of knee 04/27/2017  . Bariatric surgery status 03/06/2017  . S/P shoulder replacement 10/16/2016  . Esophagitis 10/02/2016  . Failed total knee arthroplasty, 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   . Failed total knee arthroplasty ([place]) 04/16/2016  . Bilateral chronic knee pain 03/20/2016  . Vitamin D deficiency 03/05/2016  . Anxiety disorder 03/05/2016  . Pre-operative clearance 03/05/2016  . Essential hypertension 03/05/2016  . 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 Special screening for malignant neoplasms, colon   . Benign neoplasm of transverse colon   . Screening for lipoid disorders 06/18/2010  . Routine general medical examination at a health care facility 06/18/2010  . Obesity (BMI 30-39.9) 06/18/2010   Past Medical History:  Diagnosis Date  . Anxiety   . Arthritis    hands, knees, back  . Depression   . Edema    left leg knee to foot, since veein stripping  . History of chicken pox   . History of hiatal hernia   . Hypertension   . PONV (postoperative nausea and vomiting)    PONV after knee arthroplasty  . Scoliosis    sees chiropracter monthly  . Shortness of breath dyspnea    "out of shape"  . Sleep apnea    cpap   Past Surgical History:  Procedure Laterality Date  . BREAST SURGERY  1999   breast biopsy  . BUNIONECTOMY  2000  . [place]   . COLONOSCOPY    .  COLONOSCOPY WITH PROPOFOL N/A 04/30/2015   Procedure: COLONOSCOPY WITH PROPOFOL;  Surgeon: [name], MD;  Location: [facility];  Service: Endoscopy;  Laterality: N/A;  . DILATION AND CURETTAGE OF UTERUS     x2 last one 1/17  . JOINT REPLACEMENT     LTKA-3 yrs ago  . KNEE ARTHROSCOPY Left 05/09/2015   Procedure: ARTHROSCOPY LEFT KNEE WITH SYNOVECTOMY;  Surgeon: [name], MD;  [facility];  Service: Orthopedics;  Laterality: Left;  . KNEE ARTHROSCOPY Left 04/27/2017   Procedure: Left knee arthroscopy; synovectomy;  Surgeon: [name], MD;  [facility];  Service: Orthopedics;  Laterality: Left;  . KNEE SURGERY Left 2009   x 2-meniscus, debridement  . LASIK    . POLYPECTOMY  04/30/2015   Procedure: POLYPECTOMY;   Surgeon: [name], MD;  Location: [facility];  Service: Endoscopy;;  . TONSILLECTOMY AND ADENOIDECTOMY  2007  . TOTAL KNEE ARTHROPLASTY Right 04/27/2017   Procedure: RIGHT TOTAL KNEE ARTHROPLASTY;  Surgeon: [name], MD;  [facility];  Service: Orthopedics;  Laterality: Right;  . TOTAL KNEE ARTHROPLASTY WITH REVISION COMPONENTS Left 04/16/2016   Procedure: LEFT TOTAL KNEE ARTHROPLASTY WITH POLY REVISION;  Surgeon: [name], MD;  [facility];  Service: Orthopedics;  Laterality: Left;  with abductor block  . TOTAL SHOULDER ARTHROPLASTY Right 10/16/2016  . TOTAL SHOULDER ARTHROPLASTY Right 10/16/2016   Procedure: TOTAL SHOULDER ARTHROPLASTY;  Surgeon: [name], MD;  Location: [facility];  Service: Orthopedics;  Laterality: Right;  . [place]  . VEIN LIGATION AND STRIPPING  2000   Social History   Tobacco Use  . Smoking status: Never Smoker  . Smokeless tobacco: Never Used  Substance Use Topics  . Alcohol use: No    Comment: rare  . Drug use: No   Family History  Problem Relation Age of Onset  . Arthritis Father   . Cancer Father        prostate CA  . Hypertension Father   . Diabetes Brother   . Arthritis Mother   . Hypertension Mother   . Cancer Paternal Aunt        breast cancer  . Arthritis Maternal Grandmother   . Hypertension Maternal Grandmother   . Arthritis Maternal Grandfather   . Hypertension Maternal Grandfather   . Diabetes Maternal Grandfather    No Known Allergies Current Outpatient Medications on File Prior to Visit  Medication Sig Dispense Refill  . ALPRAZolam (XANAX) 0.5 MG tablet Take 0.5 mg by mouth daily as needed for anxiety.    . Calcium 500-100 MG-UNIT CHEW Chew by mouth.    . Cholecalciferol (VITAMIN D) 50 MCG (2000 UT) tablet Take 4,000 Units by mouth daily.    . citalopram (CELEXA) 20 MG tablet Take 20 mg by mouth daily.      . meloxicam (MOBIC) 15 MG tablet Take 15 mg by mouth daily as needed.       No current facility-administered medications on file prior to visit.     Observations/Objective:  Patient appears well, in no distress (she looks and acts fatigued)  Weight is baseline (obese) No facial swelling or asymmetry Normal voice-not hoarse and no slurred speech No obvious tremor or mobility impairment Moving neck and UEs normally Able to hear the call well  No cough or shortness of breath during interview  Talkative and mentally sharp with no cognitive changes No skin changes on face or neck , no rash or pallor  Affect is normal   Assessment and Plan: Problem List Items Addressed This Visit      Other   Fever    Temp of 104.2 last night with chills and aches  100.3 now - improved (without tylenol) Pt experienced some L sided back pain yesterday -thinking it was a kidney stone Now general low back pain  No urinary symptoms No respiratory symptoms   I feel given the fever she needs to be evaluated and she cannot come here  We discussed testing for covid and then isolation  She plans to go to her urgent care for eval (likely covid testing and urine testing)  Enc tylenol for symptoms  Rest/isolation  Will update [place] with results           Follow Up Instructions: With fever and back pain I think you need to be checked out with at least a urine test and covid test  Isolate yourself  Go to your urgent care now to be evaluated and please keep [place] posted Ask them to send [place] notes and results   If symptoms suddenly worsen or you become short of breath-do go to the ER   I discussed the assessment and treatment plan with the patient. The patient was provided an opportunity to ask questions and all were answered. The patient agreed with the plan and demonstrated an understanding of the instructions.   The patient was advised to call back or seek an in-person evaluation if the symptoms worsen or if the condition fails to improve as anticipated.     [name],  MD

## 2018-12-23 NOTE — Patient Instructions (Signed)
With fever and back pain I think you need to be checked out with at least a urine test and covid test  Isolate yourself  Go to your urgent care now to be evaluated and please keep [place] posted Ask them to send [place] notes and results   If symptoms suddenly worsen or you become short of breath-do go to the ER

## 2019-02-16 DIAGNOSIS — R3121 Asymptomatic microscopic hematuria: Secondary | ICD-10-CM | POA: Diagnosis not present

## 2019-02-16 DIAGNOSIS — R8279 Other abnormal findings on microbiological examination of urine: Secondary | ICD-10-CM | POA: Diagnosis not present

## 2019-02-28 ENCOUNTER — Telehealth: Payer: Self-pay

## 2019-02-28 DIAGNOSIS — Z8616 Personal history of COVID-19: Secondary | ICD-10-CM

## 2019-02-28 HISTORY — DX: Personal history of COVID-19: Z86.16

## 2019-02-28 NOTE — Telephone Encounter (Signed)
Aware, thanks!

## 2019-02-28 NOTE — Telephone Encounter (Signed)
[place] Night - Client TELEPHONE ADVICE RECORD [facility] Patient Name: Katrina Huffman Gender: Female DOB: 12-Mar-1955 Age: 64 Y 25 D Return Phone Number: [phone] (Primary) Address: City/State/Zip: [place] [address] Client [facility] Night - Client Client Site [facility], [place] Contact Type Call Who Is Calling Patient / Member / Family / Caregiver Call Type Triage / Clinical Caller Name Rickard Rhymes Relationship To Patient Friend Return Phone Number [phone] (Primary) Chief Complaint Fever (non urgent symptom) (> THREE MONTHS) Reason for Call Symptomatic / Request for [place] stated her friend is sick. She stated she has congestion, cough, fever of 101.1 and body aches. She stated she has covid and was exposed to it twice last week. She thinks she needs to be tested. Translation No Nurse Assessment Nurse: Hassell Done, RN, Mateo Flow Date/Time Eilene Ghazi Time): 02/27/2019 11:13:31 AM Confirm and document reason for call. If symptomatic, describe symptoms. ---Caller states began having symptoms of covid Friday, and now she has congestion, cough, fever of 101.1 oral and body aches. Her cough has improved because she is taking mucinex. She was exposed to covid within the past 2 weeks. She is not having chest pain/pressure or shortness of breath. Has the patient had close contact with a person known or suspected to have the novel coronavirus illness OR traveled / lives in area with major community spread (including international travel) in the last 14 days from the onset of symptoms? * If Asymptomatic, screen for exposure and travel within the last 14 days. ---Yes Does the patient have any new or worsening symptoms? ---Yes Will a triage be completed? ---Yes Related visit to physician within the last 2 weeks? ---No Does the PT have any chronic  conditions? (i.e. diabetes, asthma, this includes High risk factors for pregnancy, etc.) ---No Is this a behavioral health or substance abuse call? ---No Guidelines Guideline Title Affirmed Question Affirmed Notes Nurse Date/Time (Eastern Time) Coronavirus (COVID-19) - Diagnosed or Suspected [1] Fever > 101 F (38.3 C) AND [2] age > 41 Sarajane Marek 02/27/2019 11:17:25 AM PLEASE NOTE: All timestamps contained within this report are represented as [place] Standard Time. CONFIDENTIALTY NOTICE: This fax transmission is intended only for the addressee. It contains information that is legally privileged, confidential or otherwise protected from use or disclosure. If you are not the intended recipient, you are strictly prohibited from reviewing, disclosing, copying using or disseminating any of this information or taking any action in reliance on or regarding this information. If you have received this fax in error, please notify us immediately by telephone so that we can arrange for its return to [place]. Phone: [phone], Toll-Free: [phone], Fax: [phone] Page: 2 of 2 Call Id: [id number] [place]. Time Eilene Ghazi Time) Disposition Final User 02/27/2019 11:21:22 AM See HCP within 4 Hours (or PCP triage) Yes Hassell Done, RN, Monika Salk Disagree/Comply Disagree Caller Understands Yes PreDisposition Did not know what to do Care Advice Given Per Guideline SEE HCP WITHIN 4 HOURS (OR PCP TRIAGE): * IF OFFICE WILL BE CLOSED AND NO PCP (PRIMARY CARE PROVIDER) SECOND-LEVEL TRIAGE: You need to be seen within the next 3 or 4 hours. A nearby Urgent Care Center [facility]) is often a good source of care. Another choice is to go to the ED. Go sooner if you become worse. HOW TO PROTECT OTHERS - WHEN YOU ARE SICK WITH COVID-19: * STAY HOME A MINIMUM OF 10 DAYS: Home isolation  is needed for at least 10 days after the symptoms started. Stay home from school or work if you are sick. Do NOT go to religious  services, child care centers, shopping, or other public places. Do NOT use public transportation (e.g., bus, taxis, ride-sharing). Do NOT allow any visitors to your home. Leave the house only if you need to seek urgent medical care. GENERAL CARE ADVICE FOR COVID-19 SYMPTOMS: * The treatment is the same whether you have COVID-19, influenza or some other respiratory virus. * Cough: Use cough drops. * Feeling dehydrated: Drink extra liquids. If the air in your home is dry, use a humidifier. * Fever: For fever over 101 F (38.3 C), take acetaminophen every 4 to 6 hours (Adults 650 mg) OR ibuprofen every 6-8 hours (Adults 400 mg). Before taking any medicine, read all the instructions on the package. Do not take aspirin unless your doctor has prescribed it for you. * Muscle aches, headache, and other pains: Often this comes and goes with the fever. Take acetaminophen every 4-6 hours (Adults 650 mg) OR ibuprofen every 6 to 8 hours (Adults 400 mg). Before taking any medicine, read all the instructions on the package. * Sore throat: Try throat lozenges, hard candy or warm chicken broth. CALL BACK IF: * You become worse. CARE ADVICE given per CORONAVIRUS (COVID-19) - DIAGNOSED OR SUSPECTED (Adult) guideline. Referrals GO TO FACILITY REFUSED

## 2019-02-28 NOTE — Telephone Encounter (Signed)
I spoke with pt; pt has appt this morning at [facility] to be tested for covid. Pt fever is down and she is feeling better; pt does not want to schedule virtual appt since feeling better. Pt will cb if needed for appt. UC & ED precautions given and pt voiced understanding. Pt will let Dr Glori Bickers know covid results.

## 2019-03-02 ENCOUNTER — Encounter: Payer: Self-pay | Admitting: Primary Care

## 2019-03-02 ENCOUNTER — Ambulatory Visit (INDEPENDENT_AMBULATORY_CARE_PROVIDER_SITE_OTHER): Payer: Medicare Other | Admitting: Primary Care

## 2019-03-02 DIAGNOSIS — U071 COVID-19: Secondary | ICD-10-CM | POA: Diagnosis not present

## 2019-03-02 NOTE — Progress Notes (Signed)
Subjective:    Patient ID: Katrina Huffman Katrina Huffman, female    DOB: 12/27/1955, 64 y.o.   MRN: [id number]  HPI  Virtual Visit via Video Note  I connected with Katrina Huffman on 03/02/19 at 10:40 AM EST by a video enabled telemedicine application and verified that I am speaking with the correct person using two identifiers.  Location: Patient: Home Provider: Office   I discussed the limitations of evaluation and management by telemedicine and the availability of in person appointments. The patient expressed understanding and agreed to proceed.  History of Present Illness:  Katrina Huffman is a 64 year old female with a history of hypertension, esophagitis, anxiety disorder who presents today to discuss Covid-19 symptoms.  She tested positive for Covid-19 on 03/01/19. Current symptoms include body aches, fever, chest congestion, chills, fatigue, mild intermittent cough. Her fevers are running 99.5-102. Symptoms began about five days ago with a cough. This morning her temperature was 99.5. Her headache has resolved.   She's been taking Mucinex and Tylenol, last dose of Tylenol was last night. She denies shortness of breath, wheezing, diarrhea, nausea. She is hydrating.    Observations/Objective:  Alert and oriented. Appears well, not sickly. No distress. Speaking in complete sentences. No cough during visit.  Assessment and Plan:  Diagnosed with Covid-19 on 03/01/19, symptoms began 02/25/19. Today she appears to be doing better compared to a few days ago. Discussed that she doesn't require use of prednisone. I recommended she continue Tylenol and Mucinex as this seems to be combating symptoms.  Discussed quarantine guidelines based off of CDC recommendations. She verbalized understanding and had no further questions. I suspect she will continue to have improved symptoms but we discussed for her to contact us with any progressive symptoms.  Follow Up Instructions:  Continue Mucinex and  Tylenol as disucssed.  You may come out of quarantine 10 days after symptom onset if you are fever free for 24 hours (no fever reducing medication) and your symptoms have significantly improved.  Please call us if your symptoms worsen.  It was a pleasure meeting you! Katrina Bossier, NP-C    I discussed the assessment and treatment plan with the patient. The patient was provided an opportunity to ask questions and all were answered. The patient agreed with the plan and demonstrated an understanding of the instructions.   The patient was advised to call back or seek an in-person evaluation if the symptoms worsen or if the condition fails to improve as anticipated.   Katrina Koch, NP    Review of Systems  Constitutional: Positive for chills and fever.  HENT: Positive for congestion.   Respiratory: Positive for cough. Negative for shortness of breath and wheezing.   Cardiovascular: Negative for chest pain.  Gastrointestinal: Negative for diarrhea and nausea.  Musculoskeletal: Positive for arthralgias and myalgias.  Neurological: Negative for headaches.       Past Medical History:  Diagnosis Date  . Anxiety   . Arthritis    hands, knees, back  . Depression   . Edema    left leg knee to foot, since veein stripping  . History of chicken pox   . History of hiatal hernia   . Hypertension   . PONV (postoperative nausea and vomiting)    PONV after knee arthroplasty  . Scoliosis    sees chiropracter monthly  . Shortness of breath dyspnea    "out of shape"  . Sleep apnea    cpap  Social History   Socioeconomic History  . Marital status: Divorced    Spouse name: Not on file  . Number of children: Not on file  . Years of education: Not on file  . Highest education level: Not on file  Occupational History  . Not on file  Tobacco Use  . Smoking status: Never Smoker  . Smokeless tobacco: Never Used  Substance and Sexual Activity  . Alcohol use: No    Comment: rare    . Drug use: No  . Sexual activity: Never  Other Topics Concern  . Not on file  Social History Narrative  . Not on file   Social Determinants of Health   Financial Resource Strain:   . Difficulty of Paying Living Expenses: Not on file  Food Insecurity:   . Worried About Charity fundraiser in the Last Year: Not on file  . Ran Out of Food in the Last Year: Not on file  Transportation Needs:   . Lack of Transportation (Medical): Not on file  . Lack of Transportation (Non-Medical): Not on file  Physical Activity:   . Days of Exercise per Week: Not on file  . Minutes of Exercise per Session: Not on file  Stress:   . Feeling of Stress : Not on file  Social Connections:   . Frequency of Communication with Friends and Family: Not on file  . Frequency of Social Gatherings with Friends and Family: Not on file  . Attends Religious Services: Not on file  . Active Member of Clubs or Organizations: Not on file  . Attends Archivist Meetings: Not on file  . Marital Status: Not on file  Intimate Partner Violence:   . Fear of Current or Ex-Partner: Not on file  . Emotionally Abused: Not on file  . Physically Abused: Not on file  . Sexually Abused: Not on file    Past Surgical History:  Procedure Laterality Date  . BREAST SURGERY  1999   breast biopsy  . BUNIONECTOMY  2000  . [place]   . COLONOSCOPY    . COLONOSCOPY WITH PROPOFOL N/A 04/30/2015   Procedure: COLONOSCOPY WITH PROPOFOL;  Surgeon: [name], MD;  Location: [place];  Service: Endoscopy;  Laterality: N/A;  . DILATION AND CURETTAGE OF UTERUS     x2 last one 1/17  . JOINT REPLACEMENT     LTKA-3 yrs ago  . KNEE ARTHROSCOPY Left 05/09/2015   Procedure: ARTHROSCOPY LEFT KNEE WITH SYNOVECTOMY;  Surgeon: [name], MD;  Location: [facility];  Service: Orthopedics;  Laterality: Left;  . KNEE ARTHROSCOPY Left 04/27/2017   Procedure: Left knee arthroscopy; synovectomy;  Surgeon: [name], MD;  Location: [facility];  Service: Orthopedics;  Laterality: Left;  . KNEE SURGERY Left 2009   x 2-meniscus, debridement  . LASIK    . POLYPECTOMY  04/30/2015   Procedure: POLYPECTOMY;  Surgeon: [name], MD;  Location: [place];  Service: Endoscopy;;  . TONSILLECTOMY AND ADENOIDECTOMY  2007  . TOTAL KNEE ARTHROPLASTY Right 04/27/2017   Procedure: RIGHT TOTAL KNEE ARTHROPLASTY;  Surgeon: [name], MD;  Location: [facility];  Service: Orthopedics;  Laterality: Right;  . TOTAL KNEE ARTHROPLASTY WITH REVISION COMPONENTS Left 04/16/2016   Procedure: LEFT TOTAL KNEE ARTHROPLASTY WITH POLY REVISION;  Surgeon: [name], MD;  Location: [facility];  Service: Orthopedics;  Laterality: Left;  with abductor block  . TOTAL SHOULDER ARTHROPLASTY Right 10/16/2016  . TOTAL SHOULDER ARTHROPLASTY  Right 10/16/2016   Procedure: TOTAL SHOULDER ARTHROPLASTY;  Surgeon: [name], MD;  Location: [place];  Service: Orthopedics;  Laterality: Right;  . [place]  . VEIN LIGATION AND STRIPPING  2000    Family History  Problem Relation Age of Onset  . Arthritis Father   . Cancer Father        prostate CA  . Hypertension Father   . Diabetes Brother   . Arthritis Mother   . Hypertension Mother   . Cancer Paternal Aunt        breast cancer  . Arthritis Maternal Grandmother   . Hypertension Maternal Grandmother   . Arthritis Maternal Grandfather   . Hypertension Maternal Grandfather   . Diabetes Maternal Grandfather     No Known Allergies  Current Outpatient Medications on File Prior to Visit  Medication Sig Dispense Refill  . ALPRAZolam (XANAX) 0.5 MG tablet Take 0.5 mg by mouth daily as needed for anxiety.    . Calcium 500-100 MG-UNIT CHEW Chew by mouth.    . Cholecalciferol (VITAMIN D) 50 MCG (2000 UT) tablet Take 4,000 Units by mouth daily.    . citalopram (CELEXA) 20 MG tablet Take 20 mg by mouth daily.      . meloxicam (MOBIC) 15 MG tablet Take 15 mg by mouth  daily as needed.      No current facility-administered medications on file prior to visit.    There were no vitals taken for this visit.   Objective:   Physical Exam  Constitutional: She is oriented to person, place, and time. She appears well-nourished. She does not have a sickly appearance. She does not appear ill.  Respiratory: Effort normal. No respiratory distress.  Neurological: She is alert and oriented to person, place, and time.  Psychiatric: She has a normal mood and affect.           Assessment & Plan:

## 2019-03-02 NOTE — Assessment & Plan Note (Signed)
Diagnosed with Covid-19 on 03/01/19, symptoms began 02/25/19. Today she appears to be doing better compared to a few days ago. Discussed that she doesn't require use of prednisone. I recommended she continue Tylenol and Mucinex as this seems to be combating symptoms.  Discussed quarantine guidelines based off of CDC recommendations. She verbalized understanding and had no further questions. I suspect she will continue to have improved symptoms but we discussed for her to contact us with any progressive symptoms.

## 2019-03-02 NOTE — Telephone Encounter (Signed)
Patient evaluated.  

## 2019-03-02 NOTE — Telephone Encounter (Signed)
Pt called back and was advised + covid test on 03/01/19 . Pt wants to know how to proceed and does pt need prednisone.  Presently pt is having fever and chills; pt said fever has been consistent between 101-102. Pt is taking tylenol for the fever; pt has also mucinex and vit D. Pt usually has joint pain but now pt having joint pain all over body. Pt said had horrible H/A on 02/27/19. No H/A last few days. Pt has neck stiffness but cannot say worse than usual. Pt feels very lethargic and no energy. Pt has dry cough, no wheezing and no SOB. No abd pain and no Diarrhea. Pt does have S/T but not super sore. Pt does feel extreme fatigue.  Pt scheduled virtual appt today at 10:40 with Gentry Fitz NP. Pt has other meds at pharmacy to be picked up and if pt needs prednisone or other med would like to get sent in ASAP. Pt will have vitals ready. FYI to Gentry Fitz NP and Vallarie Mare CMA.

## 2019-03-02 NOTE — Patient Instructions (Signed)
Continue Mucinex and Tylenol as disucssed.  You may come out of quarantine 10 days after symptom onset if you are fever free for 24 hours (no fever reducing medication) and your symptoms have significantly improved.  Please call us if your symptoms worsen.  It was a pleasure meeting you! Allie Bossier, NP-C

## 2019-03-04 ENCOUNTER — Telehealth: Payer: Self-pay

## 2019-03-04 ENCOUNTER — Other Ambulatory Visit: Payer: Self-pay | Admitting: Physician Assistant

## 2019-03-04 DIAGNOSIS — U071 COVID-19: Secondary | ICD-10-CM

## 2019-03-04 NOTE — Telephone Encounter (Signed)
Patient l/m on triage line asking about Monoclonal antibodies. Patient states her sister told her to ask about this and thought it could help patient to get better/stronger. Patient asks for a call back with feedback about this. Patient is positive for COVID and had visit with Anda Kraft on 03/02/19 for this.

## 2019-03-04 NOTE — Telephone Encounter (Signed)
Good, aware   thanks

## 2019-03-04 NOTE — Progress Notes (Signed)
  I connected by phone with Katrina Huffman on 03/04/2019 at 4:04 PM to discuss the potential use of an new treatment for mild to moderate COVID-19 viral infection in non-hospitalized patients.  This patient is a 64 y.o. female that meets the FDA criteria for Emergency Use Authorization of bamlanivimab or casirivimab\imdevimab.  Has a (+) direct SARS-CoV-2 viral test result  Has mild or moderate COVID-19   Is ? 66 years of age and weighs ? 40 kg  Is NOT hospitalized due to COVID-19  Is NOT requiring oxygen therapy or requiring an increase in baseline oxygen flow rate due to COVID-19  Is within 10 days of symptom onset  Has at least one of the high risk factor(s) for progression to severe COVID-19 and/or hospitalization as defined in EUA.  Specific high risk criteria : >/= 66 yo   BMI of 36.6 based on current weight and height   I have spoken and communicated the following to the patient or parent/caregiver:  1. FDA has authorized the emergency use of bamlanivimab and casirivimab\imdevimab for the treatment of mild to moderate COVID-19 in adults and pediatric patients with positive results of direct SARS-CoV-2 viral testing who are 36 years of age and older weighing at least 40 kg, and who are at high risk for progressing to severe COVID-19 and/or hospitalization.  2. The significant known and potential risks and benefits of bamlanivimab and casirivimab\imdevimab, and the extent to which such potential risks and benefits are unknown.  3. Information on available alternative treatments and the risks and benefits of those alternatives, including clinical trials.  4. Patients treated with bamlanivimab and casirivimab\imdevimab should continue to self-isolate and use infection control measures (e.g., wear mask, isolate, social distance, avoid sharing personal items, clean and disinfect "high touch" surfaces, and frequent handwashing) according to CDC guidelines.   5. The patient or  parent/caregiver has the option to accept or refuse bamlanivimab or casirivimab\imdevimab .  After reviewing this information with the patient, The patient agreed to proceed with receiving the bamlanimivab infusion and will be provided a copy of the Fact sheet prior to receiving the infusion.Katrina Huffman 03/04/2019 4:04 PM

## 2019-03-04 NOTE — Telephone Encounter (Signed)
Will defer to PCP for insight. Katrina Huffman, this patient appeared very stable during our visit, appears to being having a mild case. Take a look at my note.

## 2019-03-04 NOTE — Telephone Encounter (Signed)
Called infusion center and left VM requesting someone to f/u to let us know if pt is a candidate for the infusion, if so what do we need to do to get pt scheduled if not I requested them to let us know or f/u with pt to let her know.

## 2019-03-04 NOTE — Telephone Encounter (Signed)
Infusion center called pt direction and discussed with her getting the infusion, see their phone note   FYI to PCP

## 2019-03-04 NOTE — Telephone Encounter (Signed)
She fits the criteria for age.  Indicated in mild to mod cases (according to the email), and has to be within 10 days of symptom onset  I think we have to give pt info for the infusion center and then they decide who qualifies based on greatest need  The phone number is [phone] (per email)  [place]-can you see if you can call and enter her info or see if I have to   Thanks for the heads up

## 2019-03-07 ENCOUNTER — Ambulatory Visit (HOSPITAL_COMMUNITY)
Admission: RE | Admit: 2019-03-07 | Discharge: 2019-03-07 | Disposition: A | Discharge status: Home / Self Care | Payer: Medicare Other | Source: Ambulatory Visit | Attending: Pulmonary Disease | Admitting: Pulmonary Disease

## 2019-03-07 ENCOUNTER — Encounter (HOSPITAL_COMMUNITY): Payer: Self-pay

## 2019-03-07 DIAGNOSIS — U071 COVID-19: Secondary | ICD-10-CM | POA: Diagnosis present

## 2019-03-07 DIAGNOSIS — Z23 Encounter for immunization: Secondary | ICD-10-CM | POA: Diagnosis not present

## 2019-03-07 MED ORDER — SODIUM CHLORIDE 0.9 % IV SOLN
INTRAVENOUS | Status: DC | PRN
  Administered 2019-03-07: 250 mL via INTRAVENOUS

## 2019-03-07 MED ORDER — METHYLPREDNISOLONE SODIUM SUCC 125 MG IJ SOLR: 125.0000 mg | Freq: Once | INTRAMUSCULAR | Status: DC | PRN

## 2019-03-07 MED ORDER — SODIUM CHLORIDE 0.9 % IV SOLN
700.0000 mg | Freq: Once | INTRAVENOUS | Status: AC
  Administered 2019-03-07: 700 mg via INTRAVENOUS
  Filled 2019-03-07: qty 20

## 2019-03-07 MED ORDER — ALBUTEROL SULFATE HFA 108 (90 BASE) MCG/ACT IN AERS: 2.0000 | INHALATION_SPRAY | Freq: Once | RESPIRATORY_TRACT | Status: DC | PRN

## 2019-03-07 MED ORDER — EPINEPHRINE 0.3 MG/0.3ML IJ SOAJ: 0.3000 mg | Freq: Once | INTRAMUSCULAR | Status: DC | PRN

## 2019-03-07 MED ORDER — DIPHENHYDRAMINE HCL 50 MG/ML IJ SOLN: 50.0000 mg | Freq: Once | INTRAMUSCULAR | Status: DC | PRN

## 2019-03-07 MED ORDER — FAMOTIDINE IN NACL 20-0.9 MG/50ML-% IV SOLN: 20.0000 mg | Freq: Once | INTRAVENOUS | Status: DC | PRN

## 2019-03-07 NOTE — Discharge Instructions (Signed)

## 2019-03-07 NOTE — Progress Notes (Addendum)
  Diagnosis: COVID-19  Physician:Dr.Wright  Procedure: [facility] Med: bamlanivimab infusion - Provided patient with bamlanimivab fact sheet for patients, parents and caregivers prior to infusion.  Complications: No immediate complications noted.  Discharge: Discharged home   Katrina Huffman 03/07/2019

## 2019-03-11 ENCOUNTER — Telehealth: Payer: Self-pay

## 2019-03-11 NOTE — Telephone Encounter (Signed)
Patient l/m stating that she has COVID and she has not heard from the doctor on any advice about this and not been pout on any medications for this. Patient asks to have a call back with feedback. CB [phone].

## 2019-03-11 NOTE — Telephone Encounter (Signed)
She saw Anda Kraft on 1/27 and looks like she had her antibody infusion     How is she feeling?

## 2019-03-11 NOTE — Telephone Encounter (Signed)
Pt said that she felt like we didn't tell her what she should be doing to treat her covid, she said she is the one who asked for the infusion and that helped her sxs. Pt said that today was her 1st day without a fever, she still has a cough but it's not bad she just feels very weak so she has been resting a lot and drinking lots of fluids. Pt said she has "friends on several meds for covid and we didn't give her anything" pt is still taking mucinex at night but she feels like she needs to check in with [place] to see if she needs to be doing anything else

## 2019-03-11 NOTE — Telephone Encounter (Signed)
Has dry cough still- but not as bad  Taking night time mucinex -that really helps well   Had her infusion on Monday - started feeling better on Wednesday   Fever has stopped  Has a pulse oximeter - and her numbers are around 95%   No wheezing or sob  Sounds like she is doing better- inst to continue to monitor her symptoms and let us know if she is worse or develops more symptoms

## 2019-03-30 ENCOUNTER — Other Ambulatory Visit: Payer: Self-pay | Admitting: *Deleted

## 2019-03-30 MED ORDER — MELOXICAM 15 MG PO TABS: 15.0000 mg | ORAL_TABLET | Freq: Every day | ORAL | 1 refills | Status: DC | PRN

## 2019-03-30 MED ORDER — CITALOPRAM HYDROBROMIDE 20 MG PO TABS: 20.0000 mg | ORAL_TABLET | Freq: Every day | ORAL | 1 refills | Status: DC

## 2019-03-30 NOTE — Telephone Encounter (Signed)
Received fax requesting Rxs be sent to pt's new mail order pharmacy [facility].  I checked and we have never filled either meds. The citalopram was filled last by Dr. Wynelle Link and the meloxicam doesn't have any past refills on file by any provider in [facility] that I could find. Please advise

## 2019-04-03 ENCOUNTER — Ambulatory Visit: Payer: Medicare Other

## 2019-04-13 ENCOUNTER — Other Ambulatory Visit: Payer: Self-pay

## 2019-04-13 ENCOUNTER — Telehealth: Payer: Self-pay | Admitting: Gastroenterology

## 2019-04-13 DIAGNOSIS — Z86010 Personal history of colonic polyps: Secondary | ICD-10-CM

## 2019-04-13 NOTE — Telephone Encounter (Signed)
Pt has been scheduled for a colonoscopy at [facility] on 05/03/19. Instructions have been mailed.

## 2019-04-13 NOTE — Telephone Encounter (Signed)
Patient called & wanted to schedule a colonoscopy with DR Allen Norris last one done by him on 04-30-2015. She thought she was to reschedule one in 3 yrs & the colonoscopy report varies depending on the type of polyp she had.

## 2019-04-14 ENCOUNTER — Encounter: Payer: Self-pay | Admitting: Gastroenterology

## 2019-04-26 ENCOUNTER — Other Ambulatory Visit: Payer: Self-pay

## 2019-04-26 ENCOUNTER — Ambulatory Visit (INDEPENDENT_AMBULATORY_CARE_PROVIDER_SITE_OTHER): Payer: Medicare Other | Admitting: Family Medicine

## 2019-04-26 ENCOUNTER — Encounter: Payer: Self-pay | Admitting: Family Medicine

## 2019-04-26 VITALS — BP 136/86 | HR 78 | Temp 97.7°F | Ht 65.0 in | Wt 221.7 lb

## 2019-04-26 DIAGNOSIS — F411 Generalized anxiety disorder: Secondary | ICD-10-CM | POA: Diagnosis not present

## 2019-04-26 DIAGNOSIS — Z23 Encounter for immunization: Secondary | ICD-10-CM | POA: Diagnosis not present

## 2019-04-26 DIAGNOSIS — Z0000 Encounter for general adult medical examination without abnormal findings: Secondary | ICD-10-CM

## 2019-04-26 DIAGNOSIS — Z9884 Bariatric surgery status: Secondary | ICD-10-CM

## 2019-04-26 DIAGNOSIS — E559 Vitamin D deficiency, unspecified: Secondary | ICD-10-CM | POA: Diagnosis not present

## 2019-04-26 DIAGNOSIS — E669 Obesity, unspecified: Secondary | ICD-10-CM

## 2019-04-26 DIAGNOSIS — I10 Essential (primary) hypertension: Secondary | ICD-10-CM

## 2019-04-26 DIAGNOSIS — N8500 Endometrial hyperplasia, unspecified: Secondary | ICD-10-CM | POA: Insufficient documentation

## 2019-04-26 LAB — LIPID PANEL
Cholesterol: 197 mg/dL (ref 0–200)
HDL: 61.7 mg/dL (ref >39.00)
LDL Cholesterol: 114 mg/dL — ABNORMAL HIGH (ref 0–99)
NonHDL: 134.84
Total CHOL/HDL Ratio: 3
Triglycerides: 103 mg/dL (ref 0.0–149.0)
VLDL: 20.6 mg/dL (ref 0.0–40.0)

## 2019-04-26 LAB — CBC WITH DIFFERENTIAL/PLATELET
Basophils Absolute: 0.1 10*3/uL (ref 0.0–0.1)
Basophils Relative: 2.2 % (ref 0.0–3.0)
Eosinophils Absolute: 0.1 10*3/uL (ref 0.0–0.7)
Eosinophils Relative: 2.7 % (ref 0.0–5.0)
HCT: 40.8 % (ref 36.0–46.0)
Hemoglobin: 13.6 g/dL (ref 12.0–15.0)
Lymphocytes Relative: 36.1 % (ref 12.0–46.0)
Lymphs Abs: 1.7 10*3/uL (ref 0.7–4.0)
MCHC: 33.4 g/dL (ref 30.0–36.0)
MCV: 89.3 fl (ref 78.0–100.0)
Monocytes Absolute: 0.5 10*3/uL (ref 0.1–1.0)
Monocytes Relative: 10.2 % (ref 3.0–12.0)
Neutro Abs: 2.3 10*3/uL (ref 1.4–7.7)
Neutrophils Relative %: 48.8 % (ref 43.0–77.0)
Platelets: 234 10*3/uL (ref 150.0–400.0)
RBC: 4.57 Mil/uL (ref 3.87–5.11)
RDW: 14.3 % (ref 11.5–15.5)
WBC: 4.8 10*3/uL (ref 4.0–10.5)

## 2019-04-26 LAB — COMPREHENSIVE METABOLIC PANEL
ALT: 12 U/L (ref 0–35)
AST: 17 U/L (ref 0–37)
Albumin: 4.4 g/dL (ref 3.5–5.2)
Alkaline Phosphatase: 75 U/L (ref 39–117)
BUN: 19 mg/dL (ref 6–23)
CO2: 31 mEq/L (ref 19–32)
Calcium: 9.6 mg/dL (ref 8.4–10.5)
Chloride: 102 mEq/L (ref 96–112)
Creatinine, Ser: 0.7 mg/dL (ref 0.40–1.20)
GFR: 83.92 mL/min (ref >60.00)
Glucose, Bld: 94 mg/dL (ref 70–99)
Potassium: 4.4 mEq/L (ref 3.5–5.1)
Sodium: 139 mEq/L (ref 135–145)
Total Bilirubin: 0.7 mg/dL (ref 0.2–1.2)
Total Protein: 6.6 g/dL (ref 6.0–8.3)

## 2019-04-26 LAB — TSH: TSH: 2.55 u[IU]/mL (ref 0.35–4.50)

## 2019-04-26 LAB — VITAMIN D 25 HYDROXY (VIT D DEFICIENCY, FRACTURES): VITD: 72.6 ng/mL (ref 30.00–100.00)

## 2019-04-26 NOTE — Progress Notes (Signed)
Subjective:    Patient ID: Katrina Huffman, female    DOB: April 24, 1955, 64 y.o.   MRN: [id number]  This visit occurred during the SARS-CoV-2 public health emergency.  Safety protocols were in place, including screening questions prior to the visit, additional usage of staff PPE, and extensive cleaning of exam room while observing appropriate contact time as indicated for disinfecting solutions.    HPI Pt presents for welcome to medicare preventative visit   I have personally reviewed the Medicare Annual Wellness questionnaire and have noted 1. The patient's medical and social history 2. Their use of alcohol, tobacco or illicit drugs 3. Their current medications and supplements 4. The patient's functional ability including ADL's, fall risks, home safety risks and hearing or visual             impairment. 5. Diet and physical activities 6. Evidence for depression or mood disorders  The patients weight, height, BMI have been recorded in the chart and visual acuity is per eye clinic.  I have made referrals, counseling and provided education to the patient based review of the above and I have provided the pt with a written personalized care plan for preventive services. Reviewed and updated provider list, see scanned forms.  See scanned forms.  Routine anticipatory guidance given to patient.  See health maintenance. Colon cancer screening  Colonoscopy was 3/17 with 3 y recall and is planned for later this month  Breast cancer screening   Mammogram 12/08/18 at [facility] -negative  Self breast exam-nl /no lumps  Gyn care-has seen gynecologist- had D and C with hysteroscopy and progestin insertion in 2017   (result of D and C simple hyperplasia and likely polyp) Due for annual gyn in July-needs new/ wants to go to [place] side   covid vaccine planned for may 1  Flu vaccine -did not  Tetanus vaccine 2/19 Tdap  Pneumovax-will get prevnar today Zoster vaccine  -had shingrix 5/18 Dexa  7/19 at [facility] ([facility]  still in the normal range but down slt from previously) Falls - none  Fractures- none  Supplements- taking D and calcium  H/o vit D def  Exercise -none right now /plans to start using bike   Hep C screen and HIV screen  Advance directive-given materials to work on it  Cognitive function addressed- see scanned forms- and if abnormal then additional documentation follows.  She watches her short term memory / no problems now but pays attention  PMH and SH reviewed  Meds, vitals, and allergies reviewed.   ROS: See HPI.  Otherwise negative.    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 Wax)   Hypertension - controlled with lifestyle change/no medications  bp is stable today  No cp or palpitations or headaches or edema  No side effects to medicines  BP Readings from Last 3 Encounters:  04/26/19 136/86  03/07/19 (!) 111/96  07/02/18 126/80      Pulse Readings from Last 3 Encounters:  04/26/19 78  03/07/19 73  07/02/18 80   gen anxiety disorder  Takes celexa and prn xanax  Stable  and doing well   Still takes meloxicam prn  Patient Active Problem List   Diagnosis Date Noted  . Endometrial hyperplasia 04/26/2019  . Welcome to Medicare preventive visit 04/26/2019  . Hematuria 07/07/2018  . OA (osteoarthritis) of knee 04/27/2017  . Bariatric surgery status 03/06/2017  . S/P shoulder replacement 10/16/2016  . Esophagitis 10/02/2016  . Failed total knee arthroplasty, 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  . Failed total knee arthroplasty ([place]) 04/16/2016  . Bilateral chronic knee pain 03/20/2016  . Vitamin D deficiency 03/05/2016  . Anxiety disorder 03/05/2016  . Pre-operative clearance 03/05/2016  . Essential hypertension 03/05/2016  . 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 Special screening for malignant neoplasms, colon   . Benign neoplasm of transverse colon   . Screening for lipoid disorders 06/18/2010  . Routine general medical examination at a health care facility 06/18/2010  . Obesity (BMI 30-39.9) 06/18/2010   Past Medical History:  Diagnosis Date  . Anxiety   . Arthritis    hands, knees, back  . Depression   . Edema    left leg knee to foot, since veein stripping  . History of chicken pox   . History of hiatal hernia   . Hypertension   . PONV (postoperative nausea and vomiting)    PONV after knee arthroplasty  . Scoliosis    sees chiropracter monthly  . Shortness of breath dyspnea    "out of shape"  . Sleep apnea    cpap   Past Surgical History:  Procedure Laterality Date  . BARIATRIC SURGERY    . BREAST SURGERY  1999   breast biopsy  . BUNIONECTOMY  2000  . [place]   . COLONOSCOPY    . COLONOSCOPY WITH PROPOFOL N/A 04/30/2015   Procedure: COLONOSCOPY WITH PROPOFOL;  Surgeon: [name], MD;  Location: [facility];  Service: Endoscopy;  Laterality: N/A;  . DILATION AND CURETTAGE OF UTERUS     x2 last one 1/17  . JOINT REPLACEMENT     LTKA-3 yrs ago  . KNEE ARTHROSCOPY Left 05/09/2015    Procedure: ARTHROSCOPY LEFT KNEE WITH SYNOVECTOMY;  Surgeon: [name], MD;  Location: [facility];  Service: Orthopedics;  Laterality: Left;  . KNEE ARTHROSCOPY Left 04/27/2017   Procedure: Left knee arthroscopy; synovectomy;  Surgeon: [name], MD;  Location: [facility];  Service: Orthopedics;  Laterality: Left;  . KNEE SURGERY Left 2009   x 2-meniscus, debridement  . LASIK    . POLYPECTOMY  04/30/2015   Procedure: POLYPECTOMY;  Surgeon: [name], MD;  Location: [place];  Service: Endoscopy;;  . TONSILLECTOMY AND ADENOIDECTOMY  2007  . TOTAL KNEE ARTHROPLASTY Right 04/27/2017   Procedure: RIGHT  TOTAL KNEE ARTHROPLASTY;  Surgeon: [name], MD;  Location: [facility];  Service: Orthopedics;  Laterality: Right;  . TOTAL KNEE ARTHROPLASTY WITH REVISION COMPONENTS Left 04/16/2016   Procedure: LEFT TOTAL KNEE ARTHROPLASTY WITH POLY REVISION;  Surgeon: [name], MD;  Location: [facility];  Service: Orthopedics;  Laterality: Left;  with abductor block  . TOTAL SHOULDER ARTHROPLASTY Right 10/16/2016  . TOTAL SHOULDER ARTHROPLASTY Right 10/16/2016   Procedure: TOTAL SHOULDER ARTHROPLASTY;  Surgeon: [name], MD;  Location: [facility];  Service: Orthopedics;  Laterality: Right;  . [place]  . VEIN LIGATION AND STRIPPING  2000   Social History   Tobacco Use  . Smoking status: Never Smoker  . Smokeless tobacco: Never Used  Substance Use Topics  . Alcohol use: No    Comment: rare  . Drug use: No   Family History  Problem Relation Age of Onset  . Arthritis Father   . Cancer Father        prostate CA  . Hypertension Father   . Diabetes Brother   . Arthritis Mother   . Hypertension Mother   . Cancer Paternal Aunt        breast cancer  . Arthritis Maternal Grandmother   . Hypertension Maternal Grandmother   . Arthritis Maternal Grandfather   . Hypertension Maternal Grandfather   . Diabetes Maternal Grandfather    No Known Allergies Current Outpatient  Medications on File Prior to Visit  Medication Sig Dispense Refill  . ALPRAZolam (XANAX) 0.5 MG tablet Take 0.5 mg by mouth daily as needed for anxiety.    . Calcium 500-100 MG-UNIT CHEW Chew by mouth.    . Cholecalciferol (VITAMIN D) 50 MCG (2000 UT) tablet Take 4,000 Units by mouth daily.    . citalopram (CELEXA) 20 MG tablet Take 1 tablet (20 mg total) by mouth daily. 90 tablet 1  . meloxicam (MOBIC) 15 MG tablet Take 1 tablet (15 mg total) by mouth daily as needed for pain. With food 90 tablet 1   No current facility-administered medications on file prior to visit.    Review of Systems  Constitutional: Negative for activity change, appetite change, fatigue, fever and unexpected weight change.  HENT: Negative for congestion, ear pain, rhinorrhea, sinus pressure and sore throat.   Eyes: Negative for pain, redness and visual disturbance.  Respiratory: Negative for cough, shortness of breath and wheezing.   Cardiovascular: Negative for chest pain and palpitations.  Gastrointestinal: Negative for abdominal pain, blood in stool, constipation and diarrhea.  Endocrine: Negative for polydipsia and polyuria.  Genitourinary: Negative for dysuria, frequency and urgency.  Musculoskeletal: Positive for arthralgias. Negative for back pain and myalgias.  Skin: Negative for pallor and rash.  Allergic/Immunologic: Negative for environmental allergies.  Neurological: Negative for dizziness, syncope and headaches.  Hematological: Negative for adenopathy. Does not bruise/bleed easily.  Psychiatric/Behavioral: Negative for decreased concentration and dysphoric mood. The patient is not nervous/anxious.        Mood is stable with citalopram       Objective:   Physical Exam Constitutional:      General: She is not in acute distress.    Appearance: Normal appearance. She is well-developed. She is obese. She is not ill-appearing or diaphoretic.  HENT:     Head: Normocephalic and atraumatic.     Right  Ear: Tympanic membrane, ear canal and external ear normal.     Left Ear: Tympanic membrane, ear canal and external ear normal.  Nose: Nose normal. No congestion.     Mouth/Throat:     Mouth: Mucous membranes are moist.     Pharynx: Oropharynx is clear. No posterior oropharyngeal erythema.  Eyes:     General: No scleral icterus.    Extraocular Movements: Extraocular movements intact.     Conjunctiva/sclera: Conjunctivae normal.     Pupils: Pupils are equal, round, and reactive to light.  Neck:     Thyroid: No thyromegaly.     Vascular: No carotid bruit or JVD.  Cardiovascular:     Rate and Rhythm: Normal rate and regular rhythm.     Pulses: Normal pulses.     Heart sounds: Normal heart sounds. No gallop.   Pulmonary:     Effort: Pulmonary effort is normal. No respiratory distress.     Breath sounds: Normal breath sounds. No wheezing.     Comments: Good air exch Chest:     Chest wall: No tenderness.  Abdominal:     General: Bowel sounds are normal. There is no distension or abdominal bruit.     Palpations: Abdomen is soft. There is no mass.     Tenderness: There is no abdominal tenderness.     Hernia: No hernia is present.  Genitourinary:    Comments: Sees gyn for breast and pelvic exam Musculoskeletal:        General: No tenderness. Normal range of motion.     Cervical back: Normal range of motion and neck supple. No rigidity. No muscular tenderness.     Right lower leg: No edema.     Left lower leg: No edema.  Lymphadenopathy:     Cervical: No cervical adenopathy.  Skin:    General: Skin is warm and dry.     Coloration: Skin is not pale.     Findings: No erythema or rash.     Comments: Solar lentigines diffusely   Neurological:     Mental Status: She is alert. Mental status is at baseline.     Cranial Nerves: No cranial nerve deficit.     Motor: No abnormal muscle tone.     Coordination: Coordination normal.     Gait: Gait normal.     Deep Tendon Reflexes:  Reflexes are normal and symmetric. Reflexes normal.  Psychiatric:        Mood and Affect: Mood normal.        Cognition and Memory: Cognition and memory normal.     Comments: Mentally sharp             Assessment & Plan:   Problem List Items Addressed This Visit      Cardiovascular and Mediastinum   Essential hypertension    bp in fair control at this time  BP Readings from Last 1 Encounters:  04/26/19 136/86   No changes needed- not on medication currently Most recent labs reviewed  Disc lifstyle change with low sodium diet and exercise  Enc further wt loss        Relevant Orders   CBC with Differential/Platelet (Completed)   Comprehensive metabolic panel (Completed)   Lipid panel (Completed)   TSH (Completed)     Genitourinary   Endometrial hyperplasia    Seen previously by Dr Georga Bora Needs to est with new practice-ref done/interested in [place] [facility] ob[facility] No longer has post menopausal bleeding  Progestin IUD and wt loss have no doubt helped         Relevant Orders   Ambulatory referral to Gynecology  Other   Obesity (BMI 30-39.9)    Continues to work on wt loss s/p gastric sleeve surgery  Lost to 205 then gained with dietary indiscretion and lack of exercise  Discussed how this problem influences overall health and the risks it imposes  Reviewed plan for weight loss with lower calorie diet (via better food choices and also portion control or program like weight watchers) and exercise building up to or more than 30 minutes 5 days per week including some aerobic activity   Planning f/u with surg clinic and discussion of emotional eating challenges      Vitamin D deficiency    Level today with labs Disc imp for bone and overall health      Relevant Orders   VITAMIN D 25 Hydroxy (Vit-D Deficiency, Fractures) (Completed)   Anxiety disorder    Pt continues to do well as long as she continues citalopram  Prn xanax Reviewed stressors/ coping  techniques/symptoms/ support sources/ tx options and side effects in detail today  Overall doing well  Recommend better exercise and self care       Bariatric surgery status    Pt is due for yearly visit post op  Lost wt and then gained some back Strongly enc to get back on track with exercise and eating Disc opt for tx of emotional eating She had required f/u yearly for 5 y after surgery      Welcome to Medicare preventive visit - Primary    Reviewed health habits including diet and exercise and skin cancer prevention Reviewed appropriate screening tests for age  Also reviewed health mt list, fam hx and immunization status , as well as social and family history    See HPI Labs ordered  Pt has colonoscopy planned later this month Mammogram utd Needs to est with new gyn for endometrial hyperplasia in setting of progesterone IUD- ref done  dexa rev 7/19 with no falls or  fractures  Given advance directive materials to work on /counseled on this No cognitive concerns  Rev hearing screen-not ready for audiology eval but will watch (nl ear exam)  utd vision and eye care          Other Visit Diagnoses    Need for pneumococcal vaccination       Relevant Orders   Pneumococcal conjugate vaccine 13-valent (Completed)

## 2019-04-26 NOTE — Assessment & Plan Note (Signed)
Pt is due for yearly visit post op  Lost wt and then gained some back Strongly enc to get back on track with exercise and eating Disc opt for tx of emotional eating She had required f/u yearly for 5 y after surgery

## 2019-04-26 NOTE — Assessment & Plan Note (Signed)
Level today with labs Disc imp for bone and overall health

## 2019-04-26 NOTE — Assessment & Plan Note (Signed)
Seen previously by Dr Georga Bora Needs to est with new practice-ref done/interested in [place] [facility] No longer has post menopausal bleeding  Progestin IUD and wt loss have no doubt helped

## 2019-04-26 NOTE — Assessment & Plan Note (Signed)
Reviewed health habits including diet and exercise and skin cancer prevention Reviewed appropriate screening tests for age  Also reviewed health mt list, fam hx and immunization status , as well as social and family history    See HPI Labs ordered  Pt has colonoscopy planned later this month Mammogram utd Needs to est with new gyn for endometrial hyperplasia in setting of progesterone IUD- ref done  dexa rev 7/19 with no falls or  fractures  Given advance directive materials to work on /counseled on this No cognitive concerns  Rev hearing screen-not ready for audiology eval but will watch (nl ear exam)  utd vision and eye care

## 2019-04-26 NOTE — Assessment & Plan Note (Signed)
bp in fair control at this time  BP Readings from Last 1 Encounters:  04/26/19 136/86   No changes needed- not on medication currently Most recent labs reviewed  Disc lifstyle change with low sodium diet and exercise  Enc further wt loss

## 2019-04-26 NOTE — Assessment & Plan Note (Signed)
Pt continues to do well as long as she continues citalopram  Prn xanax Reviewed stressors/ coping techniques/symptoms/ support sources/ tx options and side effects in detail today  Overall doing well  Recommend better exercise and self care

## 2019-04-26 NOTE — Assessment & Plan Note (Signed)
Continues to work on wt loss s/p gastric sleeve surgery  Lost to 205 then gained with dietary indiscretion and lack of exercise  Discussed how this problem influences overall health and the risks it imposes  Reviewed plan for weight loss with lower calorie diet (via better food choices and also portion control or program like weight watchers) and exercise building up to or more than 30 minutes 5 days per week including some aerobic activity   Planning f/u with surg clinic and discussion of emotional eating challenges

## 2019-04-26 NOTE — Patient Instructions (Addendum)
prevnar vaccine today   Start back to regular exercise (bike and walking)  Keep working on weight loss  Keep working on non hunger eating    Work on advance directive (blue packet)   There is a great book called Theatre stage manager by Coral Spikes -very helpfu!   Take care of yourself  Labs today   Follow up with your bariatric surgeon as ordered   I placed a gyn referral - the office will call you

## 2019-04-27 ENCOUNTER — Other Ambulatory Visit: Payer: Self-pay

## 2019-04-27 ENCOUNTER — Ambulatory Visit: Payer: Medicare Other

## 2019-04-27 MED ORDER — SUPREP BOWEL PREP KIT 17.5-3.13-1.6 GM/177ML PO SOLN: 1.0000 | ORAL | 0 refills | Status: DC

## 2019-04-28 ENCOUNTER — Other Ambulatory Visit: Payer: Self-pay | Admitting: Gastroenterology

## 2019-05-02 ENCOUNTER — Encounter: Payer: Self-pay | Admitting: Gastroenterology

## 2019-05-02 ENCOUNTER — Other Ambulatory Visit: Payer: Self-pay

## 2019-05-02 ENCOUNTER — Telehealth: Payer: Self-pay

## 2019-05-02 MED ORDER — PEG 3350-KCL-NA BICARB-NACL 420 G PO SOLR: ORAL | 0 refills | Status: DC

## 2019-05-02 NOTE — Telephone Encounter (Signed)
Endoscopy has not recieved Covid-19 results for patient. Procedure is on 05/03/19

## 2019-05-02 NOTE — Telephone Encounter (Signed)
Spoke with endoscopy and was advised COVID test results have been received.

## 2019-05-03 ENCOUNTER — Encounter: Payer: Self-pay | Admitting: Gastroenterology

## 2019-05-03 ENCOUNTER — Ambulatory Visit: Payer: Medicare Other | Admitting: Anesthesiology

## 2019-05-03 ENCOUNTER — Ambulatory Visit
Admission: RE | Admit: 2019-05-03 | Discharge: 2019-05-03 | Disposition: A | Discharge status: Home / Self Care | Payer: Medicare Other | Attending: Gastroenterology | Admitting: Gastroenterology

## 2019-05-03 ENCOUNTER — Encounter
Admission: RE | Disposition: A | Discharge status: Home / Self Care | Payer: Self-pay | Source: Home / Self Care | Attending: Gastroenterology

## 2019-05-03 ENCOUNTER — Other Ambulatory Visit: Payer: Self-pay

## 2019-05-03 DIAGNOSIS — K635 Polyp of colon: Secondary | ICD-10-CM | POA: Diagnosis not present

## 2019-05-03 DIAGNOSIS — R0600 Dyspnea, unspecified: Secondary | ICD-10-CM | POA: Insufficient documentation

## 2019-05-03 DIAGNOSIS — Z833 Family history of diabetes mellitus: Secondary | ICD-10-CM | POA: Diagnosis not present

## 2019-05-03 DIAGNOSIS — D125 Benign neoplasm of sigmoid colon: Secondary | ICD-10-CM | POA: Diagnosis not present

## 2019-05-03 DIAGNOSIS — Z96653 Presence of artificial knee joint, bilateral: Secondary | ICD-10-CM | POA: Diagnosis not present

## 2019-05-03 DIAGNOSIS — Z791 Long term (current) use of non-steroidal anti-inflammatories (NSAID): Secondary | ICD-10-CM | POA: Insufficient documentation

## 2019-05-03 DIAGNOSIS — D123 Benign neoplasm of transverse colon: Secondary | ICD-10-CM | POA: Diagnosis not present

## 2019-05-03 DIAGNOSIS — M479 Spondylosis, unspecified: Secondary | ICD-10-CM | POA: Diagnosis not present

## 2019-05-03 DIAGNOSIS — Z8261 Family history of arthritis: Secondary | ICD-10-CM | POA: Insufficient documentation

## 2019-05-03 DIAGNOSIS — K449 Diaphragmatic hernia without obstruction or gangrene: Secondary | ICD-10-CM | POA: Insufficient documentation

## 2019-05-03 DIAGNOSIS — Z860100 Personal history of colon polyps, unspecified: Secondary | ICD-10-CM

## 2019-05-03 DIAGNOSIS — Z9884 Bariatric surgery status: Secondary | ICD-10-CM | POA: Diagnosis not present

## 2019-05-03 DIAGNOSIS — F419 Anxiety disorder, unspecified: Secondary | ICD-10-CM | POA: Diagnosis not present

## 2019-05-03 DIAGNOSIS — F329 Major depressive disorder, single episode, unspecified: Secondary | ICD-10-CM | POA: Diagnosis not present

## 2019-05-03 DIAGNOSIS — D126 Benign neoplasm of colon, unspecified: Secondary | ICD-10-CM | POA: Diagnosis not present

## 2019-05-03 DIAGNOSIS — Z803 Family history of malignant neoplasm of breast: Secondary | ICD-10-CM | POA: Diagnosis not present

## 2019-05-03 DIAGNOSIS — Z96611 Presence of right artificial shoulder joint: Secondary | ICD-10-CM | POA: Diagnosis not present

## 2019-05-03 DIAGNOSIS — Z1211 Encounter for screening for malignant neoplasm of colon: Secondary | ICD-10-CM | POA: Insufficient documentation

## 2019-05-03 DIAGNOSIS — K640 First degree hemorrhoids: Secondary | ICD-10-CM | POA: Insufficient documentation

## 2019-05-03 DIAGNOSIS — Z79899 Other long term (current) drug therapy: Secondary | ICD-10-CM | POA: Insufficient documentation

## 2019-05-03 DIAGNOSIS — I10 Essential (primary) hypertension: Secondary | ICD-10-CM | POA: Diagnosis not present

## 2019-05-03 DIAGNOSIS — M170 Bilateral primary osteoarthritis of knee: Secondary | ICD-10-CM | POA: Insufficient documentation

## 2019-05-03 DIAGNOSIS — Z8042 Family history of malignant neoplasm of prostate: Secondary | ICD-10-CM | POA: Insufficient documentation

## 2019-05-03 DIAGNOSIS — M19041 Primary osteoarthritis, right hand: Secondary | ICD-10-CM | POA: Diagnosis not present

## 2019-05-03 DIAGNOSIS — M19042 Primary osteoarthritis, left hand: Secondary | ICD-10-CM | POA: Diagnosis not present

## 2019-05-03 DIAGNOSIS — M419 Scoliosis, unspecified: Secondary | ICD-10-CM | POA: Insufficient documentation

## 2019-05-03 DIAGNOSIS — G4730 Sleep apnea, unspecified: Secondary | ICD-10-CM | POA: Diagnosis not present

## 2019-05-03 DIAGNOSIS — Z8249 Family history of ischemic heart disease and other diseases of the circulatory system: Secondary | ICD-10-CM | POA: Insufficient documentation

## 2019-05-03 HISTORY — PX: COLONOSCOPY WITH PROPOFOL: SHX5780

## 2019-05-03 SURGERY — COLONOSCOPY WITH PROPOFOL
Anesthesia: General

## 2019-05-03 MED ORDER — PROPOFOL 500 MG/50ML IV EMUL
INTRAVENOUS | Status: DC | PRN
  Administered 2019-05-03: 140 ug/kg/min via INTRAVENOUS

## 2019-05-03 MED ORDER — LIDOCAINE HCL (CARDIAC) PF 100 MG/5ML IV SOSY
PREFILLED_SYRINGE | INTRAVENOUS | Status: DC | PRN
  Administered 2019-05-03: 60 mg via INTRAVENOUS

## 2019-05-03 MED ORDER — SODIUM CHLORIDE 0.9 % IV SOLN: INTRAVENOUS | Status: DC | PRN

## 2019-05-03 MED ORDER — SODIUM CHLORIDE 0.9 % IV SOLN
INTRAVENOUS | Status: DC
  Administered 2019-05-03: 1000 mL via INTRAVENOUS

## 2019-05-03 MED ORDER — PROPOFOL 500 MG/50ML IV EMUL
INTRAVENOUS | Status: AC
  Filled 2019-05-03: qty 50

## 2019-05-03 MED ORDER — PROPOFOL 500 MG/50ML IV EMUL
INTRAVENOUS | Status: DC | PRN
  Administered 2019-05-03: 70 mg via INTRAVENOUS

## 2019-05-03 NOTE — Op Note (Signed)
[facility] Patient Name: Katrina Huffman Procedure Date: 05/03/2019 9:26 AM MRN: [id number] Account #: [id number] Date of Birth: [birth year] Admit Type: Outpatient Age: 64 Room: [facility] ENDO ROOM 4 Gender: Female Note Status: Finalized Procedure:             Colonoscopy Indications:           High risk colon cancer surveillance: Personal history                         of colonic polyps Providers:             [name] MD, MD Medicines:             Propofol per Anesthesia Complications:         No immediate complications. Procedure:             Pre-Anesthesia Assessment:                        - Prior to the procedure, a History and Physical was                         performed, and patient medications and allergies were                         reviewed. The patient's tolerance of previous                         anesthesia was also reviewed. The risks and benefits                         of the procedure and the sedation options and risks                         were discussed with the patient. All questions were                         answered, and informed consent was obtained. Prior                         Anticoagulants: The patient has taken no previous                         anticoagulant or antiplatelet agents. ASA Grade                         Assessment: II - A patient with mild systemic disease.                         After reviewing the risks and benefits, the patient                         was deemed in satisfactory condition to undergo the                         procedure.                        After obtaining informed consent, the colonoscope was  passed under direct vision. Throughout the procedure,                         the patient's blood pressure, pulse, and oxygen                         saturations were monitored continuously. The                         Colonoscope was introduced through the anus  and                         advanced to the the cecum, identified by appendiceal                         orifice and ileocecal valve. The colonoscopy was                         performed without difficulty. The patient tolerated                         the procedure well. The quality of the bowel                         preparation was excellent. Findings:      The perianal and digital rectal examinations were normal.      A 6 mm polyp was found in the transverse colon. The polyp was sessile.       The polyp was removed with a cold snare. Resection and retrieval were       complete.      A 5 mm polyp was found in the sigmoid colon. The polyp was sessile. The       polyp was removed with a cold snare. Resection and retrieval were       complete.      Non-bleeding internal hemorrhoids were found during retroflexion. The       hemorrhoids were Grade I (internal hemorrhoids that do not prolapse). Impression:            - One 6 mm polyp in the transverse colon, removed with                         a cold snare. Resected and retrieved.                        - One 5 mm polyp in the sigmoid colon, removed with a                         cold snare. Resected and retrieved.                        - Non-bleeding internal hemorrhoids. Recommendation:        - Discharge patient to home.                        - Resume previous diet.                        - Continue present medications.                        -  Await pathology results.                        - Repeat colonoscopy in 5 years for surveillance. Procedure Code(s):     --- Professional ---                        [phone], Colonoscopy, flexible; with removal of                         tumor(s), polyp(s), or other lesion(s) by snare                         technique Diagnosis Code(s):     --- Professional ---                        Z86.010, Personal history of colonic polyps                        K63.5, Polyp of colon CPT copyright 2019  American Medical Association. All rights reserved. The codes documented in this report are preliminary and upon coder review may  be revised to meet current compliance requirements. [name] MD, MD 05/03/2019 9:56:54 AM This report has been signed electronically. Number of Addenda: 0 Note Initiated On: 05/03/2019 9:26 AM Scope Withdrawal Time: 0 hours 8 minutes 33 seconds  Total Procedure Duration: 0 hours 12 minutes 58 seconds  Estimated Blood Loss:  Estimated blood loss: none.      [facility]

## 2019-05-03 NOTE — Anesthesia Postprocedure Evaluation (Signed)
Anesthesia Post Note  Patient: Katrina Huffman  Procedure(s) Performed: COLONOSCOPY WITH PROPOFOL (N/A )  Patient location during evaluation: PACU Anesthesia Type: General Level of consciousness: awake and alert Pain management: pain level controlled Vital Signs Assessment: post-procedure vital signs reviewed and stable Respiratory status: spontaneous breathing, nonlabored ventilation and respiratory function stable Cardiovascular status: blood pressure returned to baseline and stable Postop Assessment: no apparent nausea or vomiting Anesthetic complications: no     Last Vitals:  Vitals:   05/03/19 1008 05/03/19 1018  BP: 127/85 135/66  Pulse:    Resp:    Temp:    SpO2:      Last Pain:  Vitals:   05/03/19 1018  TempSrc:   PainSc: 0-No pain                 Tera Mater

## 2019-05-03 NOTE — Anesthesia Preprocedure Evaluation (Addendum)
Anesthesia Evaluation  Patient identified by MRN, date of birth, ID band Patient awake    Reviewed: Allergy & Precautions, H&P , NPO status , Patient's Chart, lab work & pertinent test results  History of Anesthesia Complications (+) PONV and history of anesthetic complications  Airway Mallampati: II  TM Distance: >3 FB Neck ROM: full    Dental  (+) Teeth Intact   Pulmonary shortness of breath and with exertion, sleep apnea ,    breath sounds clear to auscultation       Cardiovascular hypertension, (-) angina(-) Past MI (-) dysrhythmias  Rhythm:regular Rate:Normal     Neuro/Psych PSYCHIATRIC DISORDERS Anxiety Depression negative neurological ROS     GI/Hepatic Neg liver ROS, hiatal hernia,   Endo/Other  negative endocrine ROS  Renal/GU negative Renal ROS  negative genitourinary   Musculoskeletal   Abdominal   Peds  Hematology negative hematology ROS (+)   Anesthesia Other Findings Past Medical History: No date: Anxiety No date: Arthritis     Comment:  hands, knees, back No date: Depression No date: Edema     Comment:  left leg knee to foot, since veein stripping No date: History of chicken pox No date: History of hiatal hernia No date: Hypertension No date: PONV (postoperative nausea and vomiting)     Comment:  PONV after knee arthroplasty No date: Scoliosis     Comment:  sees chiropracter monthly No date: Shortness of breath dyspnea     Comment:  "out of shape" No date: Sleep apnea     Comment:  cpap  Past Surgical History: No date: BARIATRIC SURGERY 1999: BREAST SURGERY     Comment:  breast biopsy 2000: [place] : CESAREAN SECTION No date: COLONOSCOPY 04/30/2015: COLONOSCOPY WITH PROPOFOL; N/A     Comment:  Procedure: COLONOSCOPY WITH PROPOFOL;  Surgeon: [name], MD;  Location: [place];  Service:               Endoscopy;  Laterality: N/A; No date:  DILATION AND CURETTAGE OF UTERUS     Comment:  x2 last one 1/17 No date: EYE SURGERY No date: JOINT REPLACEMENT     Comment:  LTKA-3 yrs ago No date: JOINT REPLACEMENT     Comment:  TOTAL SHOULDER ARTHROPLASTY 05/09/2015: KNEE ARTHROSCOPY; Left     Comment:  Procedure: ARTHROSCOPY LEFT KNEE WITH SYNOVECTOMY;                Surgeon: [name], MD;  [facility];  Service:               Orthopedics;  Laterality: Left; 04/27/2017: KNEE ARTHROSCOPY; Left     Comment:  Procedure: Left knee arthroscopy; synovectomy;  Surgeon:              [name], MD;  [facility];  Service:               Orthopedics;  Laterality: Left; 2009: KNEE SURGERY; Left     Comment:  x 2-meniscus, debridement No date: LASIK 04/30/2015: POLYPECTOMY     Comment:  Procedure: POLYPECTOMY;  Surgeon: [name], MD;                Location: [place];  Service: Endoscopy;; 2007: TONSILLECTOMY AND ADENOIDECTOMY 04/27/2017: TOTAL KNEE ARTHROPLASTY; Right     Comment:  Procedure: RIGHT TOTAL KNEE ARTHROPLASTY;  Surgeon:  [name], MD;  [facility];  Service:               Orthopedics;  Laterality: Right; 04/16/2016: TOTAL KNEE ARTHROPLASTY WITH REVISION COMPONENTS; Left     Comment:  Procedure: LEFT TOTAL KNEE ARTHROPLASTY WITH POLY               REVISION;  Surgeon: [name], MD;  [facility];              Service: Orthopedics;  Laterality: Left;  with abductor               block 10/16/2016: TOTAL SHOULDER ARTHROPLASTY; Right 10/16/2016: TOTAL SHOULDER ARTHROPLASTY; Right     Comment:  Procedure: TOTAL SHOULDER ARTHROPLASTY;  Surgeon:               [name], MD;  Location: [place];  Service:               Orthopedics;  Laterality: Right; 1986 & 1991: TUBAL LIGATION 2000: VEIN LIGATION AND STRIPPING     Reproductive/Obstetrics negative OB ROS                           Anesthesia Physical Anesthesia Plan  ASA: II  Anesthesia  Plan: General   Post-op Pain Management:    Induction:   PONV Risk Score and Plan: Propofol infusion and TIVA  Airway Management Planned: Natural Airway and Nasal Cannula  Additional Equipment:   Intra-op Plan:   Post-operative Plan:   Informed Consent: I have reviewed the patients History and Physical, chart, labs and discussed the procedure including the risks, benefits and alternatives for the proposed anesthesia with the patient or authorized representative who has indicated his/her understanding and acceptance.     Dental Advisory Given  Plan Discussed with: Anesthesiologist, CRNA and Surgeon  Anesthesia Plan Comments:         Anesthesia Quick Evaluation

## 2019-05-03 NOTE — H&P (Signed)
Katrina Lame, MD [facility] [address], [place], [place] [address] Phone:[phone] Fax : [phone]  Primary Care Physician:  Huffman, Katrina Fanny, MD Primary Gastroenterologist:  Dr. Allen Norris  Pre-Procedure History & Physical: HPI:  Katrina Huffman is a 64 y.o. female is here for an colonoscopy.   Past Medical History:  Diagnosis Date  . Anxiety   . Arthritis    hands, knees, back  . Depression   . Edema    left leg knee to foot, since veein stripping  . History of chicken pox   . History of hiatal hernia   . Hypertension   . PONV (postoperative nausea and vomiting)    PONV after knee arthroplasty  . Scoliosis    sees chiropracter monthly  . Shortness of breath dyspnea    "out of shape"  . Sleep apnea    cpap    Past Surgical History:  Procedure Laterality Date  . BARIATRIC SURGERY    . BREAST SURGERY  1999   breast biopsy  . BUNIONECTOMY  2000  . [place]   . COLONOSCOPY    . COLONOSCOPY WITH PROPOFOL N/A 04/30/2015   Procedure: COLONOSCOPY WITH PROPOFOL;  Surgeon: Katrina Lame, MD;  Location: [place];  Service: Endoscopy;  Laterality: N/A;  . DILATION AND CURETTAGE OF UTERUS     x2 last one 1/17  . EYE SURGERY    . JOINT REPLACEMENT     LTKA-3 yrs ago  . JOINT REPLACEMENT     TOTAL SHOULDER ARTHROPLASTY  . KNEE ARTHROSCOPY Left 05/09/2015   Procedure: ARTHROSCOPY LEFT KNEE WITH SYNOVECTOMY;  Surgeon: [name], MD;  [facility];  Service: Orthopedics;  Laterality: Left;  . KNEE ARTHROSCOPY Left 04/27/2017   Procedure: Left knee arthroscopy; synovectomy;  Surgeon: [name], MD;  [facility];  Service: Orthopedics;  Laterality: Left;  . KNEE SURGERY Left 2009   x 2-meniscus, debridement  . LASIK    . POLYPECTOMY  04/30/2015   Procedure: POLYPECTOMY;  Surgeon: Katrina Lame, MD;  Location: [place];  Service: Endoscopy;;  . TONSILLECTOMY AND ADENOIDECTOMY  2007  . TOTAL KNEE ARTHROPLASTY Right 04/27/2017    Procedure: RIGHT TOTAL KNEE ARTHROPLASTY;  Surgeon: [name], MD;  [facility];  Service: Orthopedics;  Laterality: Right;  . TOTAL KNEE ARTHROPLASTY WITH REVISION COMPONENTS Left 04/16/2016   Procedure: LEFT TOTAL KNEE ARTHROPLASTY WITH POLY REVISION;  Surgeon: [name], MD;  [facility];  Service: Orthopedics;  Laterality: Left;  with abductor block  . TOTAL SHOULDER ARTHROPLASTY Right 10/16/2016  . TOTAL SHOULDER ARTHROPLASTY Right 10/16/2016   Procedure: TOTAL SHOULDER ARTHROPLASTY;  Surgeon: [name], MD;  Location: [place];  Service: Orthopedics;  Laterality: Right;  . [place]  . VEIN LIGATION AND STRIPPING  2000    Prior to Admission medications   Medication Sig Start Date End Date Taking? Authorizing Provider  ALPRAZolam [name]) 0.5 MG tablet Take 0.5 mg by mouth daily as needed for anxiety.    [provider]  Calcium 500-100 MG-UNIT CHEW Chew by mouth.    [provider]  Cholecalciferol (VITAMIN D) 50 MCG (2000 UT) tablet Take 4,000 Units by mouth daily.    [provider]  citalopram (CELEXA) 20 MG tablet Take 1 tablet (20 mg total) by mouth daily. 03/30/19   Huffman, Katrina Fanny, MD  meloxicam (MOBIC) 15 MG tablet Take 1 tablet (15 mg total) by mouth  daily as needed for pain. With food 03/30/19   Huffman, Katrina Fanny, MD  Na Sulfate-K Sulfate-Mg Sulf (SUPREP BOWEL PREP KIT) 17.5-3.13-1.6 GM/177ML SOLN Take 1 kit by mouth as directed. 04/27/19   Katrina Lame, MD  polyethylene glycol-electrolytes (NULYTELY) 420 g solution Drink one 8 oz glass every 20 mins until entire container is finished starting at 5:00pm today 05/02/19   Katrina Lame, MD    Allergies as of 04/14/2019  . (No Known Allergies)    Family History  Problem Relation Age of Onset  . Arthritis Father   . Cancer Father        prostate CA  . Hypertension Father   . Diabetes Brother   . Arthritis Mother   . Hypertension Mother   . Cancer Paternal Aunt         breast cancer  . Arthritis Maternal Grandmother   . Hypertension Maternal Grandmother   . Arthritis Maternal Grandfather   . Hypertension Maternal Grandfather   . Diabetes Maternal Grandfather     Social History   Socioeconomic History  . Marital status: Divorced    Spouse name: Not on file  . Number of children: Not on file  . Years of education: Not on file  . Highest education level: Not on file  Occupational History  . Not on file  Tobacco Use  . Smoking status: Never Smoker  . Smokeless tobacco: Never Used  Substance and Sexual Activity  . Alcohol use: No    Comment: rare  . Drug use: No  . Sexual activity: Never  Other Topics Concern  . Not on file  Social History Narrative  . Not on file   Social Determinants of Health   Financial Resource Strain:   . Difficulty of Paying Living Expenses:   Food Insecurity:   . Worried About Charity fundraiser in the Last Year:   . Arboriculturist in the Last Year:   Transportation Needs:   . Film/video editor (Medical):   Marland Kitchen Lack of Transportation (Non-Medical):   Physical Activity:   . Days of Exercise per Week:   . Minutes of Exercise per Session:   Stress:   . Feeling of Stress :   Social Connections:   . Frequency of Communication with Friends and Family:   . Frequency of Social Gatherings with Friends and Family:   . Attends Religious Services:   . Active Member of Clubs or Organizations:   . Attends Archivist Meetings:   Marland Kitchen Marital Status:   Intimate Partner Violence:   . Fear of Current or Ex-Partner:   . Emotionally Abused:   Marland Kitchen Physically Abused:   . Sexually Abused:     Review of Systems: See HPI, otherwise negative ROS  Physical Exam: BP 137/83   Pulse 71   Temp 97.6 F (36.4 C) (Tympanic)   Resp 16   Ht _0  (1.676 m)   Wt 99.8 kg   SpO2 97%   BMI 35.51 kg/m  General:   Alert,  pleasant and cooperative in NAD Head:  Normocephalic and atraumatic. Neck:  Supple; no masses  or thyromegaly. Lungs:  Clear throughout to auscultation.    Heart:  Regular rate and rhythm. Abdomen:  Soft, nontender and nondistended. Normal bowel sounds, without guarding, and without rebound.   Neurologic:  Alert and  oriented x4;  grossly normal neurologically.  Impression/Plan: AMESHIA PEWITT is here for an colonoscopy to be performed for history of adenomatous  polyps with last colonoscopy 04/2015  Risks, benefits, limitations, and alternatives regarding  colonoscopy have been reviewed with the patient.  Questions have been answered.  All parties agreeable.   Katrina Lame, MD  05/03/2019, 9:32 AM

## 2019-05-03 NOTE — Transfer of Care (Signed)
Immediate Anesthesia Transfer of Care Note  Patient: Katrina Huffman  Procedure(s) Performed: COLONOSCOPY WITH PROPOFOL (N/A )  Patient Location: PACU and Endoscopy Unit  Anesthesia Type:General  Level of Consciousness: drowsy  Airway & Oxygen Therapy: Patient Spontanous Breathing  Post-op Assessment: Report given to RN and Post -op Vital signs reviewed and stable  Post vital signs: Reviewed and stable  Last Vitals:  Vitals Value Taken Time  BP 95/51 05/03/19 1000  Temp    Pulse 62 05/03/19 1000  Resp 12 05/03/19 1000  SpO2 96 % 05/03/19 1000  Vitals shown include unvalidated device data.  Last Pain:  Vitals:   05/03/19 0914  TempSrc: Tympanic  PainSc: 0-No pain         Complications: No apparent anesthesia complications

## 2019-05-04 ENCOUNTER — Encounter: Payer: Self-pay | Admitting: Gastroenterology

## 2019-05-04 ENCOUNTER — Encounter: Payer: Self-pay | Admitting: *Deleted

## 2019-05-04 LAB — SURGICAL PATHOLOGY

## 2019-06-04 ENCOUNTER — Ambulatory Visit: Payer: Medicare Other

## 2019-06-10 ENCOUNTER — Ambulatory Visit: Payer: Medicare Other | Attending: Oncology

## 2019-06-10 DIAGNOSIS — Z23 Encounter for immunization: Secondary | ICD-10-CM

## 2019-06-10 NOTE — Progress Notes (Signed)
   Covid-19 Vaccination Clinic  Name:  Katrina Huffman    MRN: [id number] DOB: Jun 25, 1955  06/10/2019  Ms. Geesaman was observed post Covid-19 immunization for 15 minutes without incident. She was provided with Vaccine Information Sheet and instruction to access the V-Safe system.   Ms. Eichstadt was instructed to call 911 with any severe reactions post vaccine: Marland Kitchen Difficulty breathing  . Swelling of face and throat  . A fast heartbeat  . A bad rash all over body  . Dizziness and weakness   Immunizations Administered    Name Date Dose VIS Date Route   Pfizer COVID-19 Vaccine 06/10/2019  8:47 AM 0.3 mL 03/30/2018 Intramuscular   Manufacturer: [place]   Lot: [id number]   [place]: [id number]

## 2019-07-05 ENCOUNTER — Ambulatory Visit: Payer: Medicare Other | Attending: Internal Medicine

## 2019-07-05 DIAGNOSIS — Z23 Encounter for immunization: Secondary | ICD-10-CM

## 2019-07-05 NOTE — Progress Notes (Signed)
   Covid-19 Vaccination Clinic  Name:  Katrina Huffman    MRN: [id number] DOB: 1955/02/16  07/05/2019  Katrina Huffman was observed post Covid-19 immunization for 15 minutes without incident. She was provided with Vaccine Information Sheet and instruction to access the V-Safe system.   Katrina Huffman was instructed to call 911 with any severe reactions post vaccine: Marland Kitchen Difficulty breathing  . Swelling of face and throat  . A fast heartbeat  . A bad rash all over body  . Dizziness and weakness   Immunizations Administered    Name Date Dose VIS Date Route   Pfizer COVID-19 Vaccine 07/05/2019 10:07 AM 0.3 mL 03/30/2018 Intramuscular   Manufacturer: [place]   Lot: [id number]   [place]: [id number]

## 2019-07-19 DIAGNOSIS — H43811 Vitreous degeneration, right eye: Secondary | ICD-10-CM | POA: Diagnosis not present

## 2019-08-15 DIAGNOSIS — R8279 Other abnormal findings on microbiological examination of urine: Secondary | ICD-10-CM | POA: Diagnosis not present

## 2019-08-15 DIAGNOSIS — N201 Calculus of ureter: Secondary | ICD-10-CM | POA: Diagnosis not present

## 2019-08-18 ENCOUNTER — Other Ambulatory Visit: Payer: Self-pay | Admitting: Urology

## 2019-08-19 ENCOUNTER — Other Ambulatory Visit (HOSPITAL_COMMUNITY)
Admission: RE | Admit: 2019-08-19 | Discharge: 2019-08-19 | Disposition: A | Discharge status: Home / Self Care | Payer: Medicare Other | Source: Ambulatory Visit | Attending: Urology | Admitting: Urology

## 2019-08-19 DIAGNOSIS — Z20822 Contact with and (suspected) exposure to covid-19: Secondary | ICD-10-CM | POA: Diagnosis not present

## 2019-08-19 DIAGNOSIS — Z01812 Encounter for preprocedural laboratory examination: Secondary | ICD-10-CM | POA: Insufficient documentation

## 2019-08-19 LAB — SARS CORONAVIRUS 2 (TAT 6-24 HRS): SARS Coronavirus 2: NEGATIVE

## 2019-08-19 NOTE — Progress Notes (Signed)
Instructions completed with patient.  She is aware she is NPO after midnight and what medications to take.  She is told hold Mobic this weekend.  Pt is to arrive at 0600 on Monday morning.

## 2019-08-22 ENCOUNTER — Ambulatory Visit (HOSPITAL_BASED_OUTPATIENT_CLINIC_OR_DEPARTMENT_OTHER)
Admission: RE | Admit: 2019-08-22 | Discharge: 2019-08-22 | Disposition: A | Discharge status: Home / Self Care | Payer: Medicare Other | Attending: Urology | Admitting: Urology

## 2019-08-22 ENCOUNTER — Other Ambulatory Visit: Payer: Self-pay

## 2019-08-22 ENCOUNTER — Encounter (HOSPITAL_BASED_OUTPATIENT_CLINIC_OR_DEPARTMENT_OTHER): Payer: Self-pay | Admitting: Urology

## 2019-08-22 ENCOUNTER — Encounter (HOSPITAL_BASED_OUTPATIENT_CLINIC_OR_DEPARTMENT_OTHER)
Admission: RE | Disposition: A | Discharge status: Home / Self Care | Payer: Self-pay | Source: Home / Self Care | Attending: Urology

## 2019-08-22 ENCOUNTER — Ambulatory Visit (HOSPITAL_COMMUNITY): Payer: Medicare Other

## 2019-08-22 DIAGNOSIS — M1990 Unspecified osteoarthritis, unspecified site: Secondary | ICD-10-CM | POA: Insufficient documentation

## 2019-08-22 DIAGNOSIS — Z8616 Personal history of COVID-19: Secondary | ICD-10-CM | POA: Diagnosis not present

## 2019-08-22 DIAGNOSIS — Z8042 Family history of malignant neoplasm of prostate: Secondary | ICD-10-CM | POA: Diagnosis not present

## 2019-08-22 DIAGNOSIS — Z8249 Family history of ischemic heart disease and other diseases of the circulatory system: Secondary | ICD-10-CM | POA: Diagnosis not present

## 2019-08-22 DIAGNOSIS — Z86010 Personal history of colonic polyps: Secondary | ICD-10-CM | POA: Diagnosis not present

## 2019-08-22 DIAGNOSIS — N200 Calculus of kidney: Secondary | ICD-10-CM | POA: Diagnosis not present

## 2019-08-22 DIAGNOSIS — Z9884 Bariatric surgery status: Secondary | ICD-10-CM | POA: Diagnosis not present

## 2019-08-22 DIAGNOSIS — M419 Scoliosis, unspecified: Secondary | ICD-10-CM | POA: Diagnosis not present

## 2019-08-22 DIAGNOSIS — M47816 Spondylosis without myelopathy or radiculopathy, lumbar region: Secondary | ICD-10-CM | POA: Diagnosis not present

## 2019-08-22 DIAGNOSIS — N132 Hydronephrosis with renal and ureteral calculous obstruction: Secondary | ICD-10-CM | POA: Insufficient documentation

## 2019-08-22 DIAGNOSIS — Z8261 Family history of arthritis: Secondary | ICD-10-CM | POA: Insufficient documentation

## 2019-08-22 DIAGNOSIS — Z833 Family history of diabetes mellitus: Secondary | ICD-10-CM | POA: Insufficient documentation

## 2019-08-22 DIAGNOSIS — Z96611 Presence of right artificial shoulder joint: Secondary | ICD-10-CM | POA: Insufficient documentation

## 2019-08-22 DIAGNOSIS — Z87442 Personal history of urinary calculi: Secondary | ICD-10-CM | POA: Insufficient documentation

## 2019-08-22 DIAGNOSIS — K449 Diaphragmatic hernia without obstruction or gangrene: Secondary | ICD-10-CM | POA: Diagnosis not present

## 2019-08-22 DIAGNOSIS — G4730 Sleep apnea, unspecified: Secondary | ICD-10-CM | POA: Diagnosis not present

## 2019-08-22 DIAGNOSIS — Z803 Family history of malignant neoplasm of breast: Secondary | ICD-10-CM | POA: Insufficient documentation

## 2019-08-22 DIAGNOSIS — I878 Other specified disorders of veins: Secondary | ICD-10-CM | POA: Diagnosis not present

## 2019-08-22 DIAGNOSIS — Z96653 Presence of artificial knee joint, bilateral: Secondary | ICD-10-CM | POA: Diagnosis not present

## 2019-08-22 DIAGNOSIS — I10 Essential (primary) hypertension: Secondary | ICD-10-CM | POA: Diagnosis not present

## 2019-08-22 HISTORY — PX: EXTRACORPOREAL SHOCK WAVE LITHOTRIPSY: SHX1557

## 2019-08-22 SURGERY — LITHOTRIPSY, ESWL
Anesthesia: LOCAL | Laterality: Right

## 2019-08-22 MED ORDER — CIPROFLOXACIN HCL 500 MG PO TABS
500.0000 mg | ORAL_TABLET | ORAL | Status: AC
  Administered 2019-08-22: 500 mg via ORAL

## 2019-08-22 MED ORDER — DIAZEPAM 5 MG PO TABS
ORAL_TABLET | ORAL | Status: AC
  Filled 2019-08-22: qty 2

## 2019-08-22 MED ORDER — HYDROCODONE-ACETAMINOPHEN 5-325 MG PO TABS: 1.0000 | ORAL_TABLET | ORAL | 0 refills | Status: DC | PRN

## 2019-08-22 MED ORDER — DIPHENHYDRAMINE HCL 25 MG PO CAPS
25.0000 mg | ORAL_CAPSULE | ORAL | Status: AC
  Administered 2019-08-22: 25 mg via ORAL

## 2019-08-22 MED ORDER — DIAZEPAM 5 MG PO TABS
10.0000 mg | ORAL_TABLET | ORAL | Status: AC
  Administered 2019-08-22: 10 mg via ORAL

## 2019-08-22 MED ORDER — DIPHENHYDRAMINE HCL 25 MG PO CAPS
ORAL_CAPSULE | ORAL | Status: AC
  Filled 2019-08-22: qty 1

## 2019-08-22 MED ORDER — TAMSULOSIN HCL 0.4 MG PO CAPS
0.4000 mg | ORAL_CAPSULE | Freq: Every day | ORAL | 0 refills | Status: DC
Start: 2019-08-22 — End: 2020-02-07

## 2019-08-22 MED ORDER — CIPROFLOXACIN HCL 500 MG PO TABS
ORAL_TABLET | ORAL | Status: AC
  Filled 2019-08-22: qty 1

## 2019-08-22 MED ORDER — ONDANSETRON HCL 4 MG PO TABS: 4.0000 mg | ORAL_TABLET | Freq: Every day | ORAL | 1 refills | Status: DC | PRN

## 2019-08-22 MED ORDER — SODIUM CHLORIDE 0.9 % IV SOLN: INTRAVENOUS | Status: DC

## 2019-08-22 NOTE — Op Note (Signed)
[facility] Operative Note  Treating Physician: Ellison Hughs, MD  Pre-op diagnosis: 1.4 cm right UPJ stone  Post-op diagnosis: Same   Procedure: RIGHT ESWL  See Aris Everts OP note scanned into chart. Also because of the size, density, location and other factors that cannot be anticipated I feel this will likely be a staged procedure. This fact supersedes any indication in the scanned [place] stone operative note to the contrary

## 2019-08-22 NOTE — H&P (Signed)
Urology Preoperative H&P   Chief Complaint: kidney stones  History of Present Illness: Katrina Huffman is a 64 y.o. female with a 1.4 cm right UPJ stone seen on KUB from 08/15/19.  She also had a RUS on 7/12 has was found to have mild right sided hydronephrosis.  She has a prior history of kidney stones.  She denies flank pain, dysuria, hematuria or nausea/vomiting.     Past Medical History:  Diagnosis Date  . Anxiety   . Arthritis    hands, knees, back  . Depression   . Edema    left leg knee to foot, since veein stripping  . History of chicken pox   . History of hiatal hernia   . Hypertension   . PONV (postoperative nausea and vomiting)    PONV after knee arthroplasty  . Scoliosis    sees chiropracter monthly  . Shortness of breath dyspnea    "out of shape"  . Sleep apnea    cpap    Past Surgical History:  Procedure Laterality Date  . BARIATRIC SURGERY    . BREAST SURGERY  1999   breast biopsy  . BUNIONECTOMY  2000  . [place]   . COLONOSCOPY    . COLONOSCOPY WITH PROPOFOL N/A 04/30/2015   Procedure: COLONOSCOPY WITH PROPOFOL;  Surgeon: [name], MD;  Location: [place];  Service: Endoscopy;  Laterality: N/A;  . COLONOSCOPY WITH PROPOFOL N/A 05/03/2019   Procedure: COLONOSCOPY WITH PROPOFOL;  Surgeon: [name], MD;  Location: [facility];  Service: Endoscopy;  Laterality: N/A;  patient was COVID POSITIVE on 02/26/2019  . DILATION AND CURETTAGE OF UTERUS     x2 last one 1/17  . EYE SURGERY    . JOINT REPLACEMENT     LTKA-3 yrs ago  . JOINT REPLACEMENT     TOTAL SHOULDER ARTHROPLASTY  . KNEE ARTHROSCOPY Left 05/09/2015   Procedure: ARTHROSCOPY LEFT KNEE WITH SYNOVECTOMY;  Surgeon: [name], MD;  [facility];  Service: Orthopedics;  Laterality: Left;  . KNEE ARTHROSCOPY Left 04/27/2017   Procedure: Left knee arthroscopy; synovectomy;  Surgeon: [name], MD;  [facility];  Service: Orthopedics;  Laterality: Left;  .  KNEE SURGERY Left 2009   x 2-meniscus, debridement  . LASIK    . POLYPECTOMY  04/30/2015   Procedure: POLYPECTOMY;  Surgeon: [name], MD;  Location: [place];  Service: Endoscopy;;  . TONSILLECTOMY AND ADENOIDECTOMY  2007  . TOTAL KNEE ARTHROPLASTY Right 04/27/2017   Procedure: RIGHT TOTAL KNEE ARTHROPLASTY;  Surgeon: [name], MD;  [facility];  Service: Orthopedics;  Laterality: Right;  . TOTAL KNEE ARTHROPLASTY WITH REVISION COMPONENTS Left 04/16/2016   Procedure: LEFT TOTAL KNEE ARTHROPLASTY WITH POLY REVISION;  Surgeon: [name], MD;  [facility];  Service: Orthopedics;  Laterality: Left;  with abductor block  . TOTAL SHOULDER ARTHROPLASTY Right 10/16/2016  . TOTAL SHOULDER ARTHROPLASTY Right 10/16/2016   Procedure: TOTAL SHOULDER ARTHROPLASTY;  Surgeon: [name], MD;  Location: [place];  Service: Orthopedics;  Laterality: Right;  . [place]  . VEIN LIGATION AND STRIPPING  2000    Allergies: No Known Allergies  Family History  Problem Relation Age of Onset  . Arthritis Father   . Cancer Father        prostate CA  . Hypertension Father   . Diabetes Brother   . Arthritis Mother   . Hypertension Mother   .  Cancer Paternal Aunt        breast cancer  . Arthritis Maternal Grandmother   . Hypertension Maternal Grandmother   . Arthritis Maternal Grandfather   . Hypertension Maternal Grandfather   . Diabetes Maternal Grandfather     Social History:  reports that she has never smoked. She has never used smokeless tobacco. She reports that she does not drink alcohol and does not use drugs.  ROS: A complete review of systems was performed.  All systems are negative except for pertinent findings as noted.  Physical Exam:  Vital signs in last 24 hours: Temp:  [98.7 F (37.1 C)] 98.7 F (37.1 C) (07/19 0646) Pulse Rate:  [69] 69 (07/19 0646) Resp:  [18] 18 (07/19 0646) BP: (138)/(73) 138/73 (07/19 0646) SpO2:  [96 %] 96 %  (07/19 0646) Weight:  [102.5 kg] 102.5 kg (07/19 0646) Constitutional:  Alert and oriented, No acute distress Cardiovascular: Regular rate and rhythm, No JVD Respiratory: Normal respiratory effort, Lungs clear bilaterally GI: Abdomen is soft, nontender, nondistended, no abdominal masses GU: No CVA tenderness Lymphatic: No lymphadenopathy Neurologic: Grossly intact, no focal deficits Psychiatric: Normal mood and affect  Laboratory Data:  No results for input(s): WBC, HGB, HCT, PLT in the last 72 hours.  No results for input(s): NA, K, CL, GLUCOSE, BUN, CALCIUM, CREATININE in the last 72 hours.  Invalid input(s): CO3   No results found for this or any previous visit (from the past 24 hour(s)). Recent Results (from the past 240 hour(s))  SARS CORONAVIRUS 2 (TAT 6-24 HRS) Nasopharyngeal Nasopharyngeal Swab     Status: None   Collection Time: 08/19/19  2:52 PM   Specimen: Nasopharyngeal Swab  Result Value Ref Range Status   SARS Coronavirus 2 NEGATIVE NEGATIVE Final    Comment: (NOTE) SARS-CoV-2 target nucleic acids are NOT DETECTED.  The SARS-CoV-2 RNA is generally detectable in upper and lower respiratory specimens during the acute phase of infection. Negative results do not preclude SARS-CoV-2 infection, do not rule out co-infections with other pathogens, and should not be used as the sole basis for treatment or other patient management decisions. Negative results must be combined with clinical observations, patient history, and epidemiological information. The expected result is Negative.  Fact Sheet for Patients: SugarRoll.be  Fact Sheet for Healthcare Providers: https://www.woods-mathews.com/  This test is not yet approved or cleared by the [place] FDA and  has been authorized for detection and/or diagnosis of SARS-CoV-2 by FDA under an Emergency Use Authorization (EUA). This EUA will remain  in effect (meaning this test  can be used) for the duration of the COVID-19 declaration under Se ction 564(b)(1) of the Act, 21 U.S.C. section 360bbb-3(b)(1), unless the authorization is terminated or revoked sooner.  Performed at [facility], [place] [address] [place], [place] [address]     Renal Function: No results for input(s): CREATININE in the last 168 hours. CrCl cannot be calculated (Patient's most recent lab result is older than the maximum 21 days allowed.).  Radiologic Imaging: KUB and RUS from 08/15/19  I independently reviewed the above imaging studies.  Assessment and Plan IZZIE GEERS is a 64 y.o. female with a 1.4 cm right UPJ stone  The risks, benefits and alternatives of RIGHT ESWL was discussed with the patient. I described the risks which include arrhythmia, kidney contusion, kidney hemorrhage, need for transfusion, back discomfort, flank ecchymosis, flank abrasion, inability to fracture the stone, inability to pass stone fragments, [place]str[place], infection associated with obstructing  stones, need for an alternative surgical procedure and possible need for repeat shockwave lithotripsy.  The patient voices understanding and wishes to proceed.    Ellison Hughs, MD 08/22/2019, 7:29 AM  [facility] Pager: [phone]

## 2019-08-22 NOTE — Discharge Instructions (Signed)
Lithotripsy, Care After This sheet gives you information about how to care for yourself after your procedure. Your health care provider may also give you more specific instructions. If you have problems or questions, contact your health care provider. What can I expect after the procedure? After the procedure, it is common to have:  Some blood in your urine. This should only last for a few days.  Soreness in your back, sides, or upper abdomen for a few days.  Blotches or bruises on your back where the pressure wave entered the skin.  Pain, discomfort, or nausea when pieces (fragments) of the kidney stone move through the tube that carries urine from the kidney to the bladder (ureter). Stone fragments may pass soon after the procedure, but they may continue to pass for up to 4-8 weeks. ? If you have severe pain or nausea, contact your health care provider. This may be caused by a large stone that was not broken up, and this may mean that you need more treatment.  Some pain or discomfort during urination.  Some pain or discomfort in the lower abdomen or (in men) at the base of the penis. Follow these instructions at home: Medicines  Take over-the-counter and prescription medicines only as told by your health care provider.  If you were prescribed an antibiotic medicine, take it as told by your health care provider. Do not stop taking the antibiotic even if you start to feel better.  Do not drive for 24 hours if you were given a medicine to help you relax (sedative).  Do not drive or use heavy machinery while taking prescription pain medicine. Eating and drinking      Drink enough water and fluids to keep your urine clear or pale yellow. This helps any remaining pieces of the stone to pass. It can also help prevent new stones from forming.  Eat plenty of fresh fruits and vegetables.  Follow instructions from your health care provider about eating and drinking restrictions. You may be  instructed: ? To reduce how much salt (sodium) you eat or drink. Check ingredients and nutrition facts on packaged foods and beverages. ? To reduce how much meat you eat.  Eat the recommended amount of calcium for your age and gender. Ask your health care provider how much calcium you should have. General instructions  Get plenty of rest.  Most people can resume normal activities 1-2 days after the procedure. Ask your health care provider what activities are safe for you.  Your health care provider may direct you to lie in a certain position (postural drainage) and tap firmly (percuss) over your kidney area to help stone fragments pass. Follow instructions as told by your health care provider.  If directed, strain all urine through the strainer that was provided by your health care provider. ? Keep all fragments for your health care provider to see. Any stones that are found may be sent to a medical lab for examination. The stone may be as small as a grain of salt.  Keep all follow-up visits as told by your health care provider. This is important. Contact a health care provider if:  You have pain that is severe or does not get better with medicine.  You have nausea that is severe or does not go away.  You have blood in your urine longer than your health care provider told you to expect.  You have more blood in your urine.  You have pain during urination that does   not go away.  You urinate more frequently than usual and this does not go away.  You develop a rash or any other possible signs of an allergic reaction. Get help right away if:  You have severe pain in your back, sides, or upper abdomen.  You have severe pain while urinating.  Your urine is very dark red.  You have blood in your stool (feces).  You cannot pass any urine at all.  You feel a strong urge to urinate after emptying your bladder.  You have a fever or chills.  You develop shortness of breath,  difficulty breathing, or chest pain.  You have severe nausea that leads to persistent vomiting.  You faint. Summary  After this procedure, it is common to have some pain, discomfort, or nausea when pieces (fragments) of the kidney stone move through the tube that carries urine from the kidney to the bladder (ureter). If this pain or nausea is severe, however, you should contact your health care provider.  Most people can resume normal activities 1-2 days after the procedure. Ask your health care provider what activities are safe for you.  Drink enough water and fluids to keep your urine clear or pale yellow. This helps any remaining pieces of the stone to pass, and it can help prevent new stones from forming.  If directed, strain your urine and keep all fragments for your health care provider to see. Fragments or stones may be as small as a grain of salt.  Get help right away if you have severe pain in your back, sides, or upper abdomen or have severe pain while urinating. This information is not intended to replace advice given to you by your health care provider. Make sure you discuss any questions you have with your health care provider. Document Revised: 05/03/2018 Document Reviewed: 12/12/2015 Elsevier Patient Education  2020 [place]. Dietary Guidelines to Help Prevent Kidney Stones Kidney stones are deposits of minerals and salts that form inside your kidneys. Your risk of developing kidney stones may be greater depending on your diet, your lifestyle, the medicines you take, and whether you have certain medical conditions. Most people can reduce their chances of developing kidney stones by following the instructions below. Depending on your overall health and the type of kidney stones you tend to develop, your dietitian may give you more specific instructions. What are tips for following this plan? Reading food labels  Choose foods with "no salt added" or "low-salt" labels. Limit  your sodium intake to less than 1500 mg per day.  Choose foods with calcium for each meal and snack. Try to eat about 300 mg of calcium at each meal. Foods that contain 200-500 mg of calcium per serving include: ? 8 oz (237 ml) of milk, fortified nondairy milk, and fortified fruit juice. ? 8 oz (237 ml) of kefir, yogurt, and soy yogurt. ? 4 oz (118 ml) of tofu. ? 1 oz of cheese. ? 1 cup (300 g) of dried figs. ? 1 cup (91 g) of cooked broccoli. ? 1-3 oz can of sardines or mackerel.  Most people need 1000 to 1500 mg of calcium each day. Talk to your dietitian about how much calcium is recommended for you. Shopping  Buy plenty of fresh fruits and vegetables. Most people do not need to avoid fruits and vegetables, even if they contain nutrients that may contribute to kidney stones.  When shopping for convenience foods, choose: ? Whole pieces of fruit. ? Premade salads with  dressing on the side. ? Low-fat fruit and yogurt smoothies.  Avoid buying frozen meals or prepared deli foods.  Look for foods with live cultures, such as yogurt and kefir. Cooking  Do not add salt to food when cooking. Place a salt shaker on the table and allow each person to add his or her own salt to taste.  Use vegetable protein, such as beans, textured vegetable protein (TVP), or tofu instead of meat in pasta, casseroles, and soups. Meal planning   Eat less salt, if told by your dietitian. To do this: ? Avoid eating processed or premade food. ? Avoid eating fast food.  Eat less animal protein, including cheese, meat, poultry, or fish, if told by your dietitian. To do this: ? Limit the number of times you have meat, poultry, fish, or cheese each week. Eat a diet free of meat at least 2 days a week. ? Eat only one serving each day of meat, poultry, fish, or seafood. ? When you prepare animal protein, cut pieces into small portion sizes. For most meat and fish, one serving is about the size of one deck of  cards.  Eat at least 5 servings of fresh fruits and vegetables each day. To do this: ? Keep fruits and vegetables on hand for snacks. ? Eat 1 piece of fruit or a handful of berries with breakfast. ? Have a salad and fruit at lunch. ? Have two kinds of vegetables at dinner.  Limit foods that are high in a substance called oxalate. These include: ? Spinach. ? Rhubarb. ? Beets. ? Potato chips and french fries. ? Nuts.  If you regularly take a diuretic medicine, make sure to eat at least 1-2 fruits or vegetables high in potassium each day. These include: ? Avocado. ? Banana. ? Orange, prune, carrot, or tomato juice. ? Baked potato. ? Cabbage. ? Beans and split peas. General instructions   Drink enough fluid to keep your urine clear or pale yellow. This is the most important thing you can do.  Talk to your health care provider and dietitian about taking daily supplements. Depending on your health and the cause of your kidney stones, you may be advised: ? Not to take supplements with vitamin C. ? To take a calcium supplement. ? To take a daily probiotic supplement. ? To take other supplements such as magnesium, fish oil, or vitamin B6.  Take all medicines and supplements as told by your health care provider.  Limit alcohol intake to no more than 1 drink a day for nonpregnant women and 2 drinks a day for men. One drink equals 12 oz of beer, 5 oz of wine, or 1 oz of hard liquor.  Lose weight if told by your health care provider. Work with your dietitian to find strategies and an eating plan that works best for you. What foods are not recommended? Limit your intake of the following foods, or as told by your dietitian. Talk to your dietitian about specific foods you should avoid based on the type of kidney stones and your overall health. Grains Breads. Bagels. Rolls. Baked goods. Salted crackers. Cereal. Pasta. Vegetables Spinach. Rhubarb. Beets. Canned vegetables. Angie Fava.  Olives. Meats and other protein foods Nuts. Nut butters. Large portions of meat, poultry, or fish. Salted or cured meats. Deli meats. Hot dogs. Sausages. Dairy Cheese. Beverages Regular soft drinks. Regular vegetable juice. Seasonings and other foods Seasoning blends with salt. Salad dressings. Canned soups. Soy sauce. Ketchup. Barbecue sauce. Canned pasta sauce. Casseroles.  Pizza. Lasagna. Frozen meals. Potato chips. [place] fries. Summary  You can reduce your risk of kidney stones by making changes to your diet.  The most important thing you can do is drink enough fluid. You should drink enough fluid to keep your urine clear or pale yellow.  Ask your health care provider or dietitian how much protein from animal sources you should eat each day, and also how much salt and calcium you should have each day. This information is not intended to replace advice given to you by your health care provider. Make sure you discuss any questions you have with your health care provider. Document Revised: 05/12/2018 Document Reviewed: 01/01/2016 Elsevier Patient Education  2020 [employer].

## 2019-08-23 ENCOUNTER — Encounter (HOSPITAL_BASED_OUTPATIENT_CLINIC_OR_DEPARTMENT_OTHER): Payer: Self-pay | Admitting: Urology

## 2019-08-23 DIAGNOSIS — H43811 Vitreous degeneration, right eye: Secondary | ICD-10-CM | POA: Diagnosis not present

## 2019-08-26 ENCOUNTER — Other Ambulatory Visit: Payer: Self-pay | Admitting: Family Medicine

## 2019-08-31 ENCOUNTER — Other Ambulatory Visit: Payer: Self-pay

## 2019-08-31 ENCOUNTER — Ambulatory Visit (INDEPENDENT_AMBULATORY_CARE_PROVIDER_SITE_OTHER): Payer: Medicare Other | Admitting: Obstetrics and Gynecology

## 2019-08-31 ENCOUNTER — Encounter: Payer: Self-pay | Admitting: Obstetrics and Gynecology

## 2019-08-31 ENCOUNTER — Other Ambulatory Visit (HOSPITAL_COMMUNITY)
Admission: RE | Admit: 2019-08-31 | Discharge: 2019-08-31 | Disposition: A | Discharge status: Home / Self Care | Payer: Medicare Other | Source: Ambulatory Visit | Attending: Obstetrics and Gynecology | Admitting: Obstetrics and Gynecology

## 2019-08-31 VITALS — BP 140/78 | Ht 65.0 in | Wt 230.6 lb

## 2019-08-31 DIAGNOSIS — N760 Acute vaginitis: Secondary | ICD-10-CM

## 2019-08-31 DIAGNOSIS — Z13820 Encounter for screening for osteoporosis: Secondary | ICD-10-CM

## 2019-08-31 DIAGNOSIS — Z1151 Encounter for screening for human papillomavirus (HPV): Secondary | ICD-10-CM | POA: Diagnosis not present

## 2019-08-31 DIAGNOSIS — F32A Depression, unspecified: Secondary | ICD-10-CM

## 2019-08-31 DIAGNOSIS — Z124 Encounter for screening for malignant neoplasm of cervix: Secondary | ICD-10-CM

## 2019-08-31 DIAGNOSIS — Z0000 Encounter for general adult medical examination without abnormal findings: Secondary | ICD-10-CM

## 2019-08-31 DIAGNOSIS — N6320 Unspecified lump in the left breast, unspecified quadrant: Secondary | ICD-10-CM

## 2019-08-31 DIAGNOSIS — Z01419 Encounter for gynecological examination (general) (routine) without abnormal findings: Secondary | ICD-10-CM

## 2019-08-31 DIAGNOSIS — F329 Major depressive disorder, single episode, unspecified: Secondary | ICD-10-CM

## 2019-08-31 DIAGNOSIS — Z1231 Encounter for screening mammogram for malignant neoplasm of breast: Secondary | ICD-10-CM

## 2019-08-31 DIAGNOSIS — F419 Anxiety disorder, unspecified: Secondary | ICD-10-CM

## 2019-08-31 DIAGNOSIS — Z1239 Encounter for other screening for malignant neoplasm of breast: Secondary | ICD-10-CM

## 2019-08-31 MED ORDER — CITALOPRAM HYDROBROMIDE 20 MG PO TABS: 20.0000 mg | ORAL_TABLET | Freq: Every day | ORAL | 3 refills | Status: DC

## 2019-08-31 MED ORDER — MELOXICAM 15 MG PO TABS: 15.0000 mg | ORAL_TABLET | Freq: Every day | ORAL | 3 refills | Status: DC | PRN

## 2019-08-31 MED ORDER — ALPRAZOLAM 0.5 MG PO TABS: 0.5000 mg | ORAL_TABLET | Freq: Every day | ORAL | 5 refills | Status: DC | PRN

## 2019-08-31 NOTE — Patient Instructions (Signed)
[employer] Recommended Dietary Allowances for Calcium and Vitamin D  Age (yr) Calcium Recommended Dietary Allowance (mg/day) Vitamin D Recommended Dietary Allowance (international units/day)  9-18 1,300 600  19-50 1,000 600  51-70 1,200 600  71 and older 1,200 800  Data from [employer]. Dietary reference intakes: calcium, vitamin D. [place], [place]: [employer] Academies [employer]; 2011.     Exercising to Stay Healthy To become healthy and stay healthy, it is recommended that you do moderate-intensity and vigorous-intensity exercise. You can tell that you are exercising at a moderate intensity if your heart starts beating faster and you start breathing faster but can still hold a conversation. You can tell that you are exercising at a vigorous intensity if you are breathing much harder and faster and cannot hold a conversation while exercising. Exercising regularly is important. It has many health benefits, such as:  Improving overall fitness, flexibility, and endurance.  Increasing bone density.  Helping with weight control.  Decreasing body fat.  Increasing muscle strength.  Reducing stress and tension.  Improving overall health. How often should I exercise? Choose an activity that you enjoy, and set realistic goals. Your health care provider can help you make an activity plan that works for you. Exercise regularly as told by your health care provider. This may include:  Doing strength training two times a week, such as: ? Lifting weights. ? Using resistance bands. ? Push-ups. ? Sit-ups. ? Yoga.  Doing a certain intensity of exercise for a given amount of time. Choose from these options: ? A total of 150 minutes of moderate-intensity exercise every week. ? A total of 75 minutes of vigorous-intensity exercise every week. ? A mix of moderate-intensity and vigorous-intensity exercise every week. Children, pregnant women, people who have not exercised  regularly, people who are overweight, and older adults may need to talk with a health care provider about what activities are safe to do. If you have a medical condition, be sure to talk with your health care provider before you start a new exercise program. What are some exercise ideas? Moderate-intensity exercise ideas include:  Walking 1 mile (1.6 km) in about 15 minutes.  Biking.  Hiking.  Golfing.  Dancing.  Water aerobics. Vigorous-intensity exercise ideas include:  Walking 4.5 miles (7.2 km) or more in about 1 hour.  Jogging or running 5 miles (8 km) in about 1 hour.  Biking 10 miles (16.1 km) or more in about 1 hour.  Lap swimming.  Roller-skating or in-line skating.  Cross-country skiing.  Vigorous competitive sports, such as football, basketball, and soccer.  Jumping rope.  Aerobic dancing. What are some everyday activities that can help me to get exercise?  Yard work, such as: ? Pushing a lawn mower. ? Raking and bagging leaves.  Washing your car.  Pushing a stroller.  Shoveling snow.  Gardening.  Washing windows or floors. How can I be more active in my day-to-day activities?  Use stairs instead of an elevator.  Take a walk during your lunch break.  If you drive, park your car farther away from your work or school.  If you take public transportation, get off one stop early and walk the rest of the way.  Stand up or walk around during all of your indoor phone calls.  Get up, stretch, and walk around every 30 minutes throughout the day.  Enjoy exercise with a friend. Support to continue exercising will help you keep a regular routine of activity. What guidelines can   I follow while exercising?  Before you start a new exercise program, talk with your health care provider.  Do not exercise so much that you hurt yourself, feel dizzy, or get very short of breath.  Wear comfortable clothes and wear shoes with good support.  Drink plenty of  water while you exercise to prevent dehydration or heat stroke.  Work out until your breathing and your heartbeat get faster. Where to find more information  [place] Department of Health and Human Services: www.hhs.gov  Centers for Disease Control and Prevention (CDC): www.cdc.gov Summary  Exercising regularly is important. It will improve your overall fitness, flexibility, and endurance.  Regular exercise also will improve your overall health. It can help you control your weight, reduce stress, and improve your bone density.  Do not exercise so much that you hurt yourself, feel dizzy, or get very short of breath.  Before you start a new exercise program, talk with your health care provider. This information is not intended to replace advice given to you by your health care provider. Make sure you discuss any questions you have with your health care provider. Document Revised: 01/02/2017 Document Reviewed: 12/11/2016 Elsevier Patient Education  2020 [employer].   Budget-Friendly Healthy Eating There are many ways to save money at the grocery store and continue to eat healthy. You can be successful if you:  Plan meals according to your budget.  Make a grocery list and only purchase food according to your grocery list.  Prepare food yourself. What are tips for following this plan?  Reading food labels  Compare food labels between brand name foods and the store brand. Often the nutritional value is the same, but the store brand is lower cost.  Look for products that do not have added sugar, fat, or salt (sodium). These often cost the same but are healthier for you. Products may be labeled as: ? Sugar-free. ? Nonfat. ? Low-fat. ? Sodium-free. ? Low-sodium.  Look for lean ground beef labeled as at least 92% lean and 8% fat. Shopping  Buy only the items on your grocery list and go only to the areas of the store that have the items on your list.  Use coupons only for foods  and brands you normally buy. Avoid buying items you wouldn't normally buy simply because they are on sale.  Check online and in newspapers for weekly deals.  Buy healthy items from the bulk bins when available, such as herbs, spices, flour, pasta, nuts, and dried fruit.  Buy fruits and vegetables that are in season. Prices are usually lower on in-season produce.  Look at the unit price on the price tag. Use it to compare different brands and sizes to find out which item is the best deal.  Choose healthy items that are often low-cost, such as carrots, potatoes, apples, bananas, and oranges. Dried or canned beans are a low-cost protein source.  Buy in bulk and freeze extra food. Items you can buy in bulk include meats, fish, poultry, frozen fruits, and frozen vegetables.  Avoid buying "ready-to-eat" foods, such as pre-cut fruits and vegetables and pre-made salads.  If possible, shop around to discover where you can find the best prices. Consider other retailers such as dollar stores, larger wholesale stores, local fruit and vegetable stands, and farmers markets.  Do not shop when you are hungry. If you shop while hungry, it may be hard to stick to your list and budget.  Resist impulse buying. Use your grocery list as   your official plan for the week.  Buy a variety of vegetables and fruits by purchasing fresh, frozen, and canned items.  Look at the top and bottom shelves for deals. Foods at eye level (eye level of an adult or child) are usually more expensive.  Be efficient with your time when shopping. The more time you spend at the store, the more money you are likely to spend.  To save money when choosing more expensive foods like meats and dairy: ? Choose cheaper cuts of meat, such as bone-in chicken thighs and drumsticks instead of skinless and boneless chicken. When you are ready to prepare the chicken, you can remove the skin yourself to make it healthier. ? Choose lean meats like  chicken or turkey instead of beef. ? Choose canned seafood, such as tuna, salmon, or sardines. ? Buy eggs as a low-cost source of protein. ? Buy dried beans and peas, such as lentils, split peas, or kidney beans instead of meats. Dried beans and peas are a good alternative source of protein. ? Buy the larger tubs of yogurt instead of individual-sized containers.  Choose water instead of sodas and other sweetened beverages.  Avoid buying chips, cookies, and other "junk food." These items are usually expensive and not healthy. Cooking  Make extra food and freeze the extras in meal-sized containers or in individual portions for fast meals and snacks.  Pre-cook on days when you have extra time to prepare meals in advance. You can keep these meals in the fridge or freezer and reheat for a quick meal.  When you come home from the grocery store, wash, peel, and cut fruits and vegetables so they are ready to use and eat. This will help reduce food waste. Meal planning  Do not eat out or get fast food. Prepare food at home.  Make a grocery list and make sure to bring it with you to the store. If you have a smart phone, you could use your phone to create your shopping list.  Plan meals and snacks according to a grocery list and budget you create.  Use leftovers in your meal plan for the week.  Look for recipes where you can cook once and make enough food for two meals.  Include budget-friendly meals like stews, casseroles, and stir-fry dishes.  Try some meatless meals or try "no cook" meals like salads.  Make sure that half your plate is filled with fruits or vegetables. Choose from fresh, frozen, or canned fruits and vegetables. If eating canned, remember to rinse them before eating. This will remove any excess salt added for packaging. Summary  Eating healthy on a budget is possible if you plan your meals according to your budget, purchase according to your budget and grocery list, and  prepare food yourself.  Tips for buying more food on a limited budget include buying generic brands, using coupons only for foods you normally buy, and buying healthy items from the bulk bins when available.  Tips for buying cheaper food to replace expensive food include choosing cheaper, lean cuts of meat, and buying dried beans and peas. This information is not intended to replace advice given to you by your health care provider. Make sure you discuss any questions you have with your health care provider. Document Revised: 01/21/2017 Document Reviewed: 01/21/2017 Elsevier Patient Education  2020 [employer].   Bone Health Bones protect organs, store calcium, anchor muscles, and support the whole body. Keeping your bones strong is important, especially as you   get older. You can take actions to help keep your bones strong and healthy. Why is keeping my bones healthy important?  Keeping your bones healthy is important because your body constantly replaces bone cells. Cells get old, and new cells take their place. As we age, we lose bone cells because the body may not be able to make enough new cells to replace the old cells. The amount of bone cells and bone tissue you have is referred to as bone mass. The higher your bone mass, the stronger your bones. The aging process leads to an overall loss of bone mass in the body, which can increase the likelihood of:  Joint pain and stiffness.  Broken bones.  A condition in which the bones become weak and brittle (osteoporosis). A large decline in bone mass occurs in older adults. In women, it occurs about the time of menopause. What actions can I take to keep my bones healthy? Good health habits are important for maintaining healthy bones. This includes eating nutritious foods and exercising regularly. To have healthy bones, you need to get enough of the right minerals and vitamins. Most nutrition experts recommend getting these nutrients from the  foods that you eat. In some cases, taking supplements may also be recommended. Doing certain types of exercise is also important for bone health. What are the nutritional recommendations for healthy bones?  Eating a well-balanced diet with plenty of calcium and vitamin D will help to protect your bones. Nutritional recommendations vary from person to person. Ask your health care provider what is healthy for you. Here are some general guidelines. Get enough calcium Calcium is the most important (essential) mineral for bone health. Most people can get enough calcium from their diet, but supplements may be recommended for people who are at risk for osteoporosis. Good sources of calcium include:  Dairy products, such as low-fat or nonfat milk, cheese, and yogurt.  Dark green leafy vegetables, such as bok choy and broccoli.  Calcium-fortified foods, such as orange juice, cereal, bread, soy beverages, and tofu products.  Nuts, such as almonds. Follow these recommended amounts for daily calcium intake:  Children, age 1-3: 700 mg.  Children, age 4-8: 1,000 mg.  Children, age 9-13: 1,300 mg.  Teens, age 14-18: 1,300 mg.  Adults, age 19-50: 1,000 mg.  Adults, age 51-70: ? Men: 1,000 mg. ? Women: 1,200 mg.  Adults, age 71 or older: 1,200 mg.  Pregnant and breastfeeding females: ? Teens: 1,300 mg. ? Adults: 1,000 mg. Get enough vitamin D Vitamin D is the most essential vitamin for bone health. It helps the body absorb calcium. Sunlight stimulates the skin to make vitamin D, so be sure to get enough sunlight. If you live in a cold climate or you do not get outside often, your health care provider may recommend that you take vitamin D supplements. Good sources of vitamin D in your diet include:  Egg yolks.  Saltwater fish.  Milk and cereal fortified with vitamin D. Follow these recommended amounts for daily vitamin D intake:  Children and teens, age 1-18: 600 international  units.  Adults, age 50 or younger: 400-800 international units.  Adults, age 51 or older: 800-1,000 international units. Get other important nutrients Other nutrients that are important for bone health include:  Phosphorus. This mineral is found in meat, poultry, dairy foods, nuts, and legumes. The recommended daily intake for adult men and adult women is 700 mg.  Magnesium. This mineral is found in seeds, nuts, dark   green vegetables, and legumes. The recommended daily intake for adult men is 400-420 mg. For adult women, it is 310-320 mg.  Vitamin K. This vitamin is found in green leafy vegetables. The recommended daily intake is 120 mg for adult men and 90 mg for adult women. What type of physical activity is best for building and maintaining healthy bones? Weight-bearing and strength-building activities are important for building and maintaining healthy bones. Weight-bearing activities cause muscles and bones to work against gravity. Strength-building activities increase the strength of the muscles that support bones. Weight-bearing and muscle-building activities include:  Walking and hiking.  Jogging and running.  Dancing.  Gym exercises.  Lifting weights.  Tennis and racquetball.  Climbing stairs.  Aerobics. Adults should get at least 30 minutes of moderate physical activity on most days. Children should get at least 60 minutes of moderate physical activity on most days. Ask your health care provider what type of exercise is best for you. How can I find out if my bone mass is low? Bone mass can be measured with an X-ray test called a bone mineral density (BMD) test. This test is recommended for all women who are age 65 or older. It may also be recommended for:  Men who are age 70 or older.  People who are at risk for osteoporosis because of: ? Having bones that break easily. ? Having a long-term disease that weakens bones, such as kidney disease or rheumatoid  arthritis. ? Having menopause earlier than normal. ? Taking medicine that weakens bones, such as steroids, thyroid hormones, or hormone treatment for breast cancer or prostate cancer. ? Smoking. ? Drinking three or more alcoholic drinks a day. If you find that you have a low bone mass, you may be able to prevent osteoporosis or further bone loss by changing your diet and lifestyle. Where can I find more information? For more information, check out the following websites:  National Osteoporosis Foundation: www.nof.org/patients  National Institutes of Health: www.bones.nih.gov  International Osteoporosis Foundation: www.iofbonehealth.org Summary  The aging process leads to an overall loss of bone mass in the body, which can increase the likelihood of broken bones and osteoporosis.  Eating a well-balanced diet with plenty of calcium and vitamin D will help to protect your bones.  Weight-bearing and strength-building activities are also important for building and maintaining strong bones.  Bone mass can be measured with an X-ray test called a bone mineral density (BMD) test. This information is not intended to replace advice given to you by your health care provider. Make sure you discuss any questions you have with your health care provider. Document Revised: 02/16/2017 Document Reviewed: 02/16/2017 Elsevier Patient Education  2020 [employer].   

## 2019-08-31 NOTE — Progress Notes (Signed)
Gynecology Annual Exam  PCP: Abner Greenspan, MD  Chief Complaint:  Chief Complaint  Patient presents with  . Gynecologic Exam    annual  exam, pt states she needs her meds refilled all of them today.    History of Present Illness: Patient is a 64 y.o. No obstetric history on file. presents for annual exam. The patient has no complaints today.   LMP: No LMP recorded. Patient is postmenopausal. She denies postmenopausal bleeding or spotting   Review of Systems: ROS  Past Medical History:  Past Medical History:  Diagnosis Date  . Anxiety   . Arthritis    hands, knees, back  . Depression   . 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 History of 2019 novel coronavirus disease (COVID-19) 02/28/2019   positive results in care everywhere;  12-15-2019  per pt mild symptoms resolved in just over 2 wks with no residual  . History of hypertension    per pt hx dx HTN prior to gastric bypass 12/ 2018,  since lost wt no issue  . OSA (obstructive sleep apnea)    12-15-2019 per pt has not used cpap since 01/ 2021 stated does not need  . PONV (postoperative nausea and vomiting)    PONV after knee arthroplasty  . Right ureteral stone   . 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 Scoliosis    sees chiropracter monthly  . Wears glasses     Past Surgical History:  Past Surgical History:  Procedure Laterality Date  . BREAST SURGERY  1999   breast biopsy  . BUNIONECTOMY  2000  . [place]; 1985  . COLONOSCOPY WITH PROPOFOL N/A 04/30/2015   Procedure: COLONOSCOPY WITH PROPOFOL;  Surgeon: [name], MD;  Location: [place];  Service: Endoscopy;  Laterality: N/A;  . COLONOSCOPY WITH PROPOFOL N/A 05/03/2019   Procedure: COLONOSCOPY WITH PROPOFOL;  Surgeon: [name], MD;  Location: [facility] ENDOSCOPY;  Service:  Endoscopy;  Laterality: N/A;  patient was COVID POSITIVE on 02/26/2019  . CYSTOSCOPY WITH RETROGRADE PYELOGRAM, URETEROSCOPY AND STENT PLACEMENT Right 12/22/2019   Procedure: CYSTOSCOPY WITH RIGHT RETROGRADE PYELOGRAM, URETEROSCOPY WITH HOLMIUM LASER , BASKET EXTRACTION OF STONES AND STENT PLACEMENT;  Surgeon: Ardis Hughs, MD;  Location[facility];  Service: Urology;  Laterality: Right;  . DIAGNOSTIC LAPAROSCOPY  01/19/2017   LOA w/ Gastropexy/ EGD  . EXTRACORPOREAL SHOCK WAVE LITHOTRIPSY Right 08/22/2019   Procedure: RIGHT EXTRACORPOREAL SHOCK WAVE LITHOTRIPSY (ESWL);  Surgeon: Ceasar Mons, MD;  Location: [facility];  Service: Urology;  Laterality: Right;  . HYSTEROSCOPY WITH D & C  x2  last one 01/ 2017  . KNEE ARTHROSCOPY Left 05/09/2015   Procedure: ARTHROSCOPY LEFT KNEE WITH SYNOVECTOMY;  Surgeon: [name], MD;  Location: [facility];  Service: Orthopedics;  Laterality: Left;  . KNEE ARTHROSCOPY Left 04/27/2017   Procedure: Left knee arthroscopy; synovectomy;  Surgeon: [name], MD;  Location: [facility];  Service: Orthopedics;  Laterality: Left;  . KNEE ARTHROSCOPY Left 2009  . LAPAROSCOPIC GASTRIC SLEEVE RESECTION  01/14/2017  . POLYPECTOMY  04/30/2015   Procedure: POLYPECTOMY;  Surgeon: [name], MD;  Location: [place];  Service: Endoscopy;;  . TONSILLECTOMY AND ADENOIDECTOMY  2007  . TOTAL KNEE ARTHROPLASTY Right 04/27/2017   Procedure: RIGHT  TOTAL KNEE ARTHROPLASTY;  Surgeon: [name], MD;  Location: [facility];  Service: Orthopedics;  Laterality: Right;  . TOTAL KNEE ARTHROPLASTY Left 2014  . TOTAL KNEE ARTHROPLASTY WITH REVISION COMPONENTS Left 04/16/2016   Procedure: LEFT TOTAL KNEE ARTHROPLASTY WITH POLY REVISION;  Surgeon: [name], MD;  Location: [facility];  Service: Orthopedics;  Laterality: Left;  with abductor block  . TOTAL SHOULDER ARTHROPLASTY Right 10/16/2016  . TOTAL SHOULDER ARTHROPLASTY Right  10/16/2016   Procedure: TOTAL SHOULDER ARTHROPLASTY;  Surgeon: [name], MD;  Location: [place];  Service: Orthopedics;  Laterality: Right;  . [place]  . VEIN LIGATION AND STRIPPING  2000    Gynecologic History:  No LMP recorded. Patient is postmenopausal. Obstetric History: No obstetric history on file.  Family History:  Family History  Problem Relation Age of Onset  . Arthritis Father   . Cancer Father        prostate CA  . Hypertension Father   . Diabetes Brother   . Arthritis Mother   . Hypertension Mother   . Cancer Paternal Aunt        breast cancer  . Arthritis Maternal Grandmother   . Hypertension Maternal Grandmother   . Arthritis Maternal Grandfather   . Hypertension Maternal Grandfather   . Diabetes Maternal Grandfather     Social History:  Social History   Socioeconomic History  . Marital status: Divorced    Spouse name: Not on file  . Number of children: Not on file  . Years of education: Not on file  . Highest education level: Not on file  Occupational History  . Not on file  Tobacco Use  . Smoking status: Never Smoker  . Smokeless tobacco: Never Used  Vaping Use  . Vaping Use: Never used  Substance and Sexual Activity  . Alcohol use: Not Currently    Comment: rare  . Drug use: Never  . Sexual activity: Not on file  Other Topics Concern  . Not on file  Social History Narrative   Right handed   Drinks caffeine   One story home   Social Determinants of Health   Financial Resource Strain: Not on file  Food Insecurity: Not on file  Transportation Needs: Not on file  Physical Activity: Not on file  Stress: Not on file  Social Connections: Not on file  Intimate Partner Violence: Not on file    Allergies:  No Known Allergies  Medications: Prior to Admission medications   Medication Sig Start Date End Date Taking? Authorizing Provider  ALPRAZolam [name]) 0.5 MG tablet Take 1 tablet (0.5 mg total) by mouth daily as  needed for anxiety. 08/31/19  Yes Sahalie Beth, Stefanie Libel, MD  Calcium 500-100 MG-UNIT CHEW Chew by mouth.   Yes [provider]  Cholecalciferol (VITAMIN D) 50 MCG (2000 UT) tablet Take 4,000 Units by mouth daily.   Yes [provider]  citalopram (CELEXA) 20 MG tablet TAKE 1 TABLET BY MOUTH  DAILY 02/13/20   Tower, [name], MD  Cranberry 400 MG CAPS Take by mouth daily.  07/19/19   [provider]  gabapentin (NEURONTIN) 300 MG capsule Take 3 capsules (900 mg total) by mouth 3 (three) times daily. 04/20/20   Pieter Partridge, DO  meloxicam (MOBIC) 15 MG tablet Take 1 tablet (15 mg total) by mouth daily as needed for pain. With food 03/19/20   Homero Fellers, MD  Multiple Vitamins-Minerals (BARIATRIC MULTIVITAMINS/IRON) CAPS Take by mouth daily.  [provider]  phenazopyridine (PYRIDIUM) 200 MG tablet Take 1 tablet (200 mg total) by mouth 3 (three) times daily as needed for pain. 12/22/19   Ardis Hughs, MD    Physical Exam Vitals: Blood pressure (!) 140/78, height 5\' 5"  (1.651 m), weight (!) 230 lb 9.6 oz (104.6 kg).  Physical Exam Constitutional:      Appearance: She is well-developed.  Genitourinary:     Genitourinary Comments: External: Normal appearing vulva. No lesions noted.  Speculum examination: Normal appearing cervix. No blood in the vaginal vault. no discharge.   IUD strings seen Bimanual examination: Uterus midline, non-tender, normal in size, shape and contour.  No CMT. No adnexal masses. No adnexal tenderness. Pelvis not fixed.  Breast Exam: breast equal without skin changes, nipple discharge, lump in left breast noted. No enlarged lymph nodes   HENT:     Head: Normocephalic and atraumatic.  Neck:     Thyroid: No thyromegaly.  Cardiovascular:     Rate and Rhythm: Normal rate and regular rhythm.     Heart sounds: Normal heart sounds.  Pulmonary:     Effort: Pulmonary effort is normal.     Breath sounds: Normal breath sounds.   Abdominal:     General: Bowel sounds are normal. There is no distension.     Palpations: Abdomen is soft. There is no mass.  Musculoskeletal:     Cervical back: Neck supple.  Neurological:     Mental Status: She is alert and oriented to person, place, and time.  Skin:    General: Skin is warm and dry.  Psychiatric:        Behavior: Behavior normal.        Thought Content: Thought content normal.        Judgment: Judgment normal.  Vitals reviewed.     Female chaperone present for pelvic and breast  portions of the physical exam  Assessment: 64 y.o. No obstetric history on file. routine annual exam  Plan: Problem List Items Addressed This Visit   None   Visit Diagnoses    Health maintenance examination    -  Primary   Breast cancer screening by mammogram       Screening for osteoporosis       Cervical cancer screening       Relevant Orders   Cytology - PAP (Completed)   Acute vaginitis       Relevant Orders   NuSwab BV and Candida, NAA (Completed)   Encounter for screening breast examination       Encounter for gynecological examination without abnormal finding       Left breast lump       Relevant Orders   MM DIAG BREAST TOMO BILATERAL   Anxiety and depression       Relevant Medications   ALPRAZolam (XANAX) 0.5 MG tablet      1) Mammogram - recommend yearly screening mammogram.  Mammogram was ordered today. Left breast lump noes, diagnostic imaging ordered.  2) STI screening was offered and declined  3) ASCCP guidelines and rational discussed.  Patient opts for pap smear today.  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 Recommended surveillance biopsy and exchange of IUD since it has been 5 years  since placement.  Adrian Prows MD, [name], [facility] 05/30/2020 9:37 PM

## 2019-09-03 LAB — NUSWAB BV AND CANDIDA, NAA
Candida albicans, NAA: NEGATIVE
Candida glabrata, NAA: NEGATIVE

## 2019-09-05 LAB — CYTOLOGY - PAP
Comment: NEGATIVE
Diagnosis: NEGATIVE
High risk HPV: NEGATIVE

## 2019-09-06 DIAGNOSIS — N200 Calculus of kidney: Secondary | ICD-10-CM | POA: Diagnosis not present

## 2019-09-09 ENCOUNTER — Other Ambulatory Visit: Payer: Self-pay | Admitting: *Deleted

## 2019-09-09 ENCOUNTER — Inpatient Hospital Stay
Admission: RE | Admit: 2019-09-09 | Discharge: 2019-09-09 | Disposition: A | Discharge status: Home / Self Care | Payer: Self-pay | Source: Ambulatory Visit | Attending: *Deleted | Admitting: *Deleted

## 2019-09-09 DIAGNOSIS — Z1231 Encounter for screening mammogram for malignant neoplasm of breast: Secondary | ICD-10-CM

## 2019-09-11 ENCOUNTER — Other Ambulatory Visit: Payer: Self-pay | Admitting: Family Medicine

## 2019-09-11 DIAGNOSIS — Z0000 Encounter for general adult medical examination without abnormal findings: Secondary | ICD-10-CM

## 2019-09-27 ENCOUNTER — Other Ambulatory Visit: Payer: Self-pay | Admitting: Family Medicine

## 2019-09-27 DIAGNOSIS — Z0000 Encounter for general adult medical examination without abnormal findings: Secondary | ICD-10-CM

## 2019-09-29 DIAGNOSIS — R3121 Asymptomatic microscopic hematuria: Secondary | ICD-10-CM | POA: Diagnosis not present

## 2019-09-29 DIAGNOSIS — N200 Calculus of kidney: Secondary | ICD-10-CM | POA: Diagnosis not present

## 2019-10-12 DIAGNOSIS — R519 Headache, unspecified: Secondary | ICD-10-CM | POA: Diagnosis not present

## 2019-10-12 DIAGNOSIS — M9902 Segmental and somatic dysfunction of thoracic region: Secondary | ICD-10-CM | POA: Diagnosis not present

## 2019-10-12 DIAGNOSIS — M542 Cervicalgia: Secondary | ICD-10-CM | POA: Diagnosis not present

## 2019-10-12 DIAGNOSIS — M9901 Segmental and somatic dysfunction of cervical region: Secondary | ICD-10-CM | POA: Diagnosis not present

## 2019-10-17 DIAGNOSIS — M9902 Segmental and somatic dysfunction of thoracic region: Secondary | ICD-10-CM | POA: Diagnosis not present

## 2019-10-17 DIAGNOSIS — R519 Headache, unspecified: Secondary | ICD-10-CM | POA: Diagnosis not present

## 2019-10-17 DIAGNOSIS — M9901 Segmental and somatic dysfunction of cervical region: Secondary | ICD-10-CM | POA: Diagnosis not present

## 2019-10-17 DIAGNOSIS — M542 Cervicalgia: Secondary | ICD-10-CM | POA: Diagnosis not present

## 2019-10-20 DIAGNOSIS — R519 Headache, unspecified: Secondary | ICD-10-CM | POA: Diagnosis not present

## 2019-10-20 DIAGNOSIS — M9902 Segmental and somatic dysfunction of thoracic region: Secondary | ICD-10-CM | POA: Diagnosis not present

## 2019-10-20 DIAGNOSIS — M542 Cervicalgia: Secondary | ICD-10-CM | POA: Diagnosis not present

## 2019-10-20 DIAGNOSIS — M9901 Segmental and somatic dysfunction of cervical region: Secondary | ICD-10-CM | POA: Diagnosis not present

## 2019-10-24 DIAGNOSIS — M9902 Segmental and somatic dysfunction of thoracic region: Secondary | ICD-10-CM | POA: Diagnosis not present

## 2019-10-24 DIAGNOSIS — R519 Headache, unspecified: Secondary | ICD-10-CM | POA: Diagnosis not present

## 2019-10-24 DIAGNOSIS — M9901 Segmental and somatic dysfunction of cervical region: Secondary | ICD-10-CM | POA: Diagnosis not present

## 2019-10-24 DIAGNOSIS — M542 Cervicalgia: Secondary | ICD-10-CM | POA: Diagnosis not present

## 2019-10-26 DIAGNOSIS — M9901 Segmental and somatic dysfunction of cervical region: Secondary | ICD-10-CM | POA: Diagnosis not present

## 2019-10-26 DIAGNOSIS — R519 Headache, unspecified: Secondary | ICD-10-CM | POA: Diagnosis not present

## 2019-10-26 DIAGNOSIS — M542 Cervicalgia: Secondary | ICD-10-CM | POA: Diagnosis not present

## 2019-10-26 DIAGNOSIS — M9902 Segmental and somatic dysfunction of thoracic region: Secondary | ICD-10-CM | POA: Diagnosis not present

## 2019-10-31 DIAGNOSIS — R519 Headache, unspecified: Secondary | ICD-10-CM | POA: Diagnosis not present

## 2019-10-31 DIAGNOSIS — M9902 Segmental and somatic dysfunction of thoracic region: Secondary | ICD-10-CM | POA: Diagnosis not present

## 2019-10-31 DIAGNOSIS — M542 Cervicalgia: Secondary | ICD-10-CM | POA: Diagnosis not present

## 2019-10-31 DIAGNOSIS — M9901 Segmental and somatic dysfunction of cervical region: Secondary | ICD-10-CM | POA: Diagnosis not present

## 2019-11-01 DIAGNOSIS — R519 Headache, unspecified: Secondary | ICD-10-CM | POA: Diagnosis not present

## 2019-11-01 DIAGNOSIS — M542 Cervicalgia: Secondary | ICD-10-CM | POA: Diagnosis not present

## 2019-11-01 DIAGNOSIS — M9902 Segmental and somatic dysfunction of thoracic region: Secondary | ICD-10-CM | POA: Diagnosis not present

## 2019-11-01 DIAGNOSIS — M9901 Segmental and somatic dysfunction of cervical region: Secondary | ICD-10-CM | POA: Diagnosis not present

## 2019-11-07 DIAGNOSIS — M9901 Segmental and somatic dysfunction of cervical region: Secondary | ICD-10-CM | POA: Diagnosis not present

## 2019-11-07 DIAGNOSIS — M9903 Segmental and somatic dysfunction of lumbar region: Secondary | ICD-10-CM | POA: Diagnosis not present

## 2019-11-07 DIAGNOSIS — M9902 Segmental and somatic dysfunction of thoracic region: Secondary | ICD-10-CM | POA: Diagnosis not present

## 2019-11-07 DIAGNOSIS — M9907 Segmental and somatic dysfunction of upper extremity: Secondary | ICD-10-CM | POA: Diagnosis not present

## 2019-11-10 DIAGNOSIS — M9907 Segmental and somatic dysfunction of upper extremity: Secondary | ICD-10-CM | POA: Diagnosis not present

## 2019-11-10 DIAGNOSIS — M9901 Segmental and somatic dysfunction of cervical region: Secondary | ICD-10-CM | POA: Diagnosis not present

## 2019-11-10 DIAGNOSIS — M9902 Segmental and somatic dysfunction of thoracic region: Secondary | ICD-10-CM | POA: Diagnosis not present

## 2019-11-10 DIAGNOSIS — M9903 Segmental and somatic dysfunction of lumbar region: Secondary | ICD-10-CM | POA: Diagnosis not present

## 2019-11-14 DIAGNOSIS — M9901 Segmental and somatic dysfunction of cervical region: Secondary | ICD-10-CM | POA: Diagnosis not present

## 2019-11-14 DIAGNOSIS — M9907 Segmental and somatic dysfunction of upper extremity: Secondary | ICD-10-CM | POA: Diagnosis not present

## 2019-11-14 DIAGNOSIS — M9902 Segmental and somatic dysfunction of thoracic region: Secondary | ICD-10-CM | POA: Diagnosis not present

## 2019-11-14 DIAGNOSIS — M9903 Segmental and somatic dysfunction of lumbar region: Secondary | ICD-10-CM | POA: Diagnosis not present

## 2019-11-17 DIAGNOSIS — M9902 Segmental and somatic dysfunction of thoracic region: Secondary | ICD-10-CM | POA: Diagnosis not present

## 2019-11-17 DIAGNOSIS — M9901 Segmental and somatic dysfunction of cervical region: Secondary | ICD-10-CM | POA: Diagnosis not present

## 2019-11-17 DIAGNOSIS — M9903 Segmental and somatic dysfunction of lumbar region: Secondary | ICD-10-CM | POA: Diagnosis not present

## 2019-11-17 DIAGNOSIS — M9907 Segmental and somatic dysfunction of upper extremity: Secondary | ICD-10-CM | POA: Diagnosis not present

## 2019-11-21 DIAGNOSIS — N202 Calculus of kidney with calculus of ureter: Secondary | ICD-10-CM | POA: Diagnosis not present

## 2019-11-21 DIAGNOSIS — N200 Calculus of kidney: Secondary | ICD-10-CM | POA: Diagnosis not present

## 2019-12-01 DIAGNOSIS — M47816 Spondylosis without myelopathy or radiculopathy, lumbar region: Secondary | ICD-10-CM | POA: Diagnosis not present

## 2019-12-01 DIAGNOSIS — N132 Hydronephrosis with renal and ureteral calculous obstruction: Secondary | ICD-10-CM | POA: Diagnosis not present

## 2019-12-01 DIAGNOSIS — Z87442 Personal history of urinary calculi: Secondary | ICD-10-CM | POA: Diagnosis not present

## 2019-12-01 DIAGNOSIS — N200 Calculus of kidney: Secondary | ICD-10-CM | POA: Diagnosis not present

## 2019-12-02 ENCOUNTER — Telehealth: Payer: Self-pay

## 2019-12-02 NOTE — Telephone Encounter (Signed)
[facility] - Client TELEPHONE ADVICE RECORD [facility] Patient Name: Katrina Huffman Gender: Female DOB: 03-15-1955 Age: 64 Y 9 M 29 D Return Phone Number: [phone] (Primary) Address: City/State/Zip: [place] [address] Client [place] [facility] - Client Client Site [place] MD Contact Type Call Who Is Calling Patient / Member / Family / Caregiver Call Type Triage / Clinical Relationship To Patient Self Return Phone Number [phone] (Primary) Chief Complaint NUMBNESS/TINGLING- sudden on one side of the body or face Reason for Call Symptomatic / Request for [place] caller states she has numbness on the left side of her face and the office wants her triaged Translation No Nurse Assessment Nurse: Hassell Done, RN, Melanie Date/Time ([place] Time): 12/02/2019 3:22:03 PM Confirm and document reason for call. If symptomatic, describe symptoms. ---Caller has numbness on the L side of her face and she has had it for 2 months. She states she is always holding the L side of her face and her boss wanted her to call her Dr, Does the patient have any new or worsening symptoms? ---Yes Will a triage be completed? ---Yes Related visit to physician within the last 2 weeks? ---No Does the PT have any chronic conditions? (i.e. diabetes, asthma, this includes High risk factors for pregnancy, etc.) ---No Is this a behavioral health or substance abuse call? ---No Guidelines Guideline Title Affirmed Question Affirmed Notes Nurse Date/Time (Eastern Time) Neurologic Deficit Bell's palsy suspected (i.e., weakness on only one side of the face, developing over hours to days, no other symptoms) Hassell Done, RN, Melanie 12/02/2019 3:25:00 PM Disp. Time Eilene Ghazi Time) Disposition Final User 12/02/2019 3:20:12 PM Send to Urgent Queue Birdena Jubilee 12/02/2019 3:29:10 PM Go to  ED Now (or PCP triage) Yes Hassell Done, RN, Donnajean Lopes Disagree/Comply Comply PLEASE NOTE: All timestamps contained within this report are represented as [place] Standard Time. CONFIDENTIALTY NOTICE: This fax transmission is intended only for the addressee. It contains information that is legally privileged, confidential or otherwise protected from use or disclosure. If you are not the intended recipient, you are strictly prohibited from reviewing, disclosing, copying using or disseminating any of this information or taking any action in reliance on or regarding this information. If you have received this fax in error, please notify us immediately by telephone so that we can arrange for its return to [place]. Phone: [phone], Toll-Free: [phone], Fax: [phone] Page: 2 of 2 Call Id: [id number] [place] Understands Yes PreDisposition Call Doctor Care Advice Given Per Guideline GO TO ED NOW (OR PCP TRIAGE): * IF NO PCP (PRIMARY CARE PROVIDER) SECOND-LEVEL TRIAGE: You need to be seen within the next hour. Go to the [facility] at _____________ [place] as soon as you can. CARE ADVICE given per Neurologic Deficit (Adult) guideline. Comments User: Verner Chol, RN Date/Time Eilene Ghazi Time): 12/02/2019 3:30:54 PM Caller states she has an event she has to attend and she will wait for her appt with her PCP on Tuesday. Understands the advice to go to ER or UC but does not plan to do that and does not want to speak to the office. Referrals GO TO FACILITY REFUSED

## 2019-12-02 NOTE — Telephone Encounter (Signed)
I spoke with pt; and pt has had numbness on lt side of face for couple of months on and off; not having numbness now. Pt has seen chiropractor from neck issues 2 x a wk for 6 wks and has not helped the numb feeling. Pt is not having any symptoms right now; ED and UC precautions given and pt voiced understanding and will go to UC or ED prior to appt with Dr Glori Bickers if condition changes or worsens. Pt will keep appt with Dr Glori Bickers on 12/06/19.

## 2019-12-06 ENCOUNTER — Ambulatory Visit (INDEPENDENT_AMBULATORY_CARE_PROVIDER_SITE_OTHER): Payer: Medicare Other | Admitting: Family Medicine

## 2019-12-06 ENCOUNTER — Other Ambulatory Visit: Payer: Self-pay | Admitting: Urology

## 2019-12-06 ENCOUNTER — Telehealth: Payer: Self-pay | Admitting: Family Medicine

## 2019-12-06 ENCOUNTER — Encounter: Payer: Self-pay | Admitting: Family Medicine

## 2019-12-06 ENCOUNTER — Other Ambulatory Visit: Payer: Self-pay

## 2019-12-06 VITALS — BP 124/88 | HR 67 | Temp 98.3°F | Ht 65.0 in | Wt 234.9 lb

## 2019-12-06 DIAGNOSIS — Z23 Encounter for immunization: Secondary | ICD-10-CM

## 2019-12-06 DIAGNOSIS — I10 Essential (primary) hypertension: Secondary | ICD-10-CM | POA: Diagnosis not present

## 2019-12-06 DIAGNOSIS — R202 Paresthesia of skin: Secondary | ICD-10-CM

## 2019-12-06 LAB — BASIC METABOLIC PANEL
BUN: 19 mg/dL (ref 6–23)
CO2: 30 mEq/L (ref 19–32)
Calcium: 9.1 mg/dL (ref 8.4–10.5)
Chloride: 103 mEq/L (ref 96–112)
Creatinine, Ser: 0.7 mg/dL (ref 0.40–1.20)
GFR: 90.49 mL/min (ref >60.00)
Glucose, Bld: 91 mg/dL (ref 70–99)
Potassium: 4.4 mEq/L (ref 3.5–5.1)
Sodium: 139 mEq/L (ref 135–145)

## 2019-12-06 LAB — CBC WITH DIFFERENTIAL/PLATELET
Basophils Absolute: 0.1 10*3/uL (ref 0.0–0.1)
Basophils Relative: 1 % (ref 0.0–3.0)
Eosinophils Absolute: 0.2 10*3/uL (ref 0.0–0.7)
Eosinophils Relative: 2.4 % (ref 0.0–5.0)
HCT: 39.6 % (ref 36.0–46.0)
Hemoglobin: 13.4 g/dL (ref 12.0–15.0)
Lymphocytes Relative: 21.5 % (ref 12.0–46.0)
Lymphs Abs: 1.7 10*3/uL (ref 0.7–4.0)
MCHC: 33.7 g/dL (ref 30.0–36.0)
MCV: 87.6 fl (ref 78.0–100.0)
Monocytes Absolute: 0.7 10*3/uL (ref 0.1–1.0)
Monocytes Relative: 9.6 % (ref 3.0–12.0)
Neutro Abs: 5.1 10*3/uL (ref 1.4–7.7)
Neutrophils Relative %: 65.5 % (ref 43.0–77.0)
Platelets: 223 10*3/uL (ref 150.0–400.0)
RBC: 4.53 Mil/uL (ref 3.87–5.11)
RDW: 13.3 % (ref 11.5–15.5)
WBC: 7.8 10*3/uL (ref 4.0–10.5)

## 2019-12-06 LAB — SEDIMENTATION RATE: Sed Rate: 31 mm/hr — ABNORMAL HIGH (ref 0–30)

## 2019-12-06 NOTE — Telephone Encounter (Signed)
Labs look ok so I put in the MRI order  Please let pt know  I will fwd to [facility]

## 2019-12-06 NOTE — Assessment & Plan Note (Signed)
L sided discomfort and paresthesia (cold/tingling/discomfort) intermittent over L face Suspect likely trigeminal neuralgia (handout given)  Reassuring exam  No motor deficits or rash  Lab pending -cmet,cbc and esr  Will likely order MRI of brain  May need neuro input later

## 2019-12-06 NOTE — Assessment & Plan Note (Signed)
bp in fair control at this time  BP Readings from Last 1 Encounters:  12/06/19 124/88   No changes needed (only medication is generic flomax) Most recent labs reviewed  Disc lifstyle change with low sodium diet and exercise

## 2019-12-06 NOTE — Patient Instructions (Addendum)
I want to order MRI of the brain and will do labs to make sure contrast will be ok  We will be in touch with you   We will try to schedule this after kidney stone procedure   If symptoms worsen or change in the meantime let me know

## 2019-12-06 NOTE — Progress Notes (Signed)
Subjective:    Patient ID: Katrina Huffman, female    DOB: November 18, 1955, 64 y.o.   MRN: [id number]  This visit occurred during the SARS-CoV-2 public health emergency.  Safety protocols were in place, including screening questions prior to the visit, additional usage of staff PPE, and extensive cleaning of exam room while observing appropriate contact time as indicated for disinfecting solutions.    HPI Pt presents with facial paresthesia   Wt Readings from Last 3 Encounters:  12/06/19 234 lb 14.4 oz (106.5 kg)  08/31/19 (!) 230 lb 9.6 oz (104.6 kg)  08/22/19 226 lb (102.5 kg)   39.09 kg/m  Notes this stared about 2 months ago   Was at work and noted tingling on top of head that then went down the left  side all the way to chin (tingly/cold sensation)  Now it affects her ear and side of face -today becoming more uncomfortable with addition of an achey feeling in jaw (also feels cold) Happens every day and comes and goes  Finds herself holding the side of her face   Neck also bothers her  Went to chiropractor-worse instead of better (helped neck but not face)   Needs flu shot today   No headache  No changes in vision or hearing  Does not grind her teeth  No fever/feels ok  Very busy lately , perhaps also stressed work wise   No rash at all on scalp or face   No changes in face/no droop whatsoever    HTN bp is stable today  No cp or palpitations or headaches or edema  No side effects to medicines  BP Readings from Last 3 Encounters:  12/06/19 124/88  08/31/19 (!) 140/78  08/22/19 (!) 149/83    She takes generic flomax for kidney stones  bp has been controlled without further medication  Prev on hctz   Pulse Readings from Last 3 Encounters:  12/06/19 67  08/22/19 60  05/03/19 63   Lab Results  Component Value Date   CREATININE 0.70 04/26/2019   BUN 19 04/26/2019   NA 139 04/26/2019   K 4.4 04/26/2019   CL 102 04/26/2019   CO2 31 04/26/2019   Lab Results    Component Value Date   WBC 4.8 04/26/2019   HGB 13.6 04/26/2019   HCT 40.8 04/26/2019   MCV 89.3 04/26/2019   PLT 234.0 04/26/2019   Lab Results  Component Value Date   GURKYHCW23 762 03/06/2017    Has renal stone surgery planned -stent  Thinks it will be in the next 2 weeks   Patient Active Problem List   Diagnosis Date Noted  . Facial paresthesia 12/06/2019  . Personal history of colonic polyps   . Endometrial hyperplasia 04/26/2019  . Welcome to Medicare preventive visit 04/26/2019  . Hematuria 07/07/2018  . OA (osteoarthritis) of knee 04/27/2017  . Bariatric surgery status 03/06/2017  . S/P shoulder replacement 10/16/2016  . Esophagitis 10/02/2016  . Failed total knee arthroplasty, 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  . Failed total knee arthroplasty ([place]) 04/16/2016  . Bilateral chronic knee pain 03/20/2016  . Vitamin D deficiency 03/05/2016  . Anxiety disorder 03/05/2016  . Pre-operative clearance 03/05/2016  . Essential hypertension 03/05/2016  . 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 Special screening for malignant neoplasms, colon   . Benign neoplasm of transverse colon   . Screening for lipoid disorders 06/18/2010  . Routine general medical examination at a health care facility 06/18/2010  .  Obesity (BMI 30-39.9) 06/18/2010   Past Medical History:  Diagnosis Date  . Anxiety   . Arthritis    hands, knees, back  . Depression   . Edema    left leg knee to foot, since veein stripping  . History of chicken pox   . History of hiatal hernia   . Hypertension   . PONV (postoperative nausea and vomiting)    PONV after knee arthroplasty  . Scoliosis    sees chiropracter monthly  . Shortness of breath dyspnea    "out of shape"  . Sleep apnea    cpap   Past Surgical History:  Procedure Laterality Date  . BARIATRIC SURGERY    . BREAST SURGERY  1999   breast biopsy  . BUNIONECTOMY  2000  . [place]   . COLONOSCOPY     . COLONOSCOPY WITH PROPOFOL N/A 04/30/2015   Procedure: COLONOSCOPY WITH PROPOFOL;  Surgeon: [name], MD;  Location: [place];  Service: Endoscopy;  Laterality: N/A;  . COLONOSCOPY WITH PROPOFOL N/A 05/03/2019   Procedure: COLONOSCOPY WITH PROPOFOL;  Surgeon: [name], MD;  Location: [facility] ENDOSCOPY;  Service: Endoscopy;  Laterality: N/A;  patient was COVID POSITIVE on 02/26/2019  . DILATION AND CURETTAGE OF UTERUS     x2 last one 1/17  . EXTRACORPOREAL SHOCK WAVE LITHOTRIPSY Right 08/22/2019   Procedure: RIGHT EXTRACORPOREAL SHOCK WAVE LITHOTRIPSY (ESWL);  Surgeon: Ceasar Mons, MD;  Location: [facility];  Service: Urology;  Laterality: Right;  . EYE SURGERY    . JOINT REPLACEMENT     LTKA-3 yrs ago  . JOINT REPLACEMENT     TOTAL SHOULDER ARTHROPLASTY  . KNEE ARTHROSCOPY Left 05/09/2015   Procedure: ARTHROSCOPY LEFT KNEE WITH SYNOVECTOMY;  Surgeon: [name], MD;  Location: [facility];  Service: Orthopedics;  Laterality: Left;  . KNEE ARTHROSCOPY Left 04/27/2017   Procedure: Left knee arthroscopy; synovectomy;  Surgeon: [name], MD;  Location: [facility];  Service: Orthopedics;  Laterality: Left;  . KNEE SURGERY Left 2009   x 2-meniscus, debridement  . LASIK    . POLYPECTOMY  04/30/2015   Procedure: POLYPECTOMY;  Surgeon: [name], MD;  Location: [place];  Service: Endoscopy;;  . TONSILLECTOMY AND ADENOIDECTOMY  2007  . TOTAL KNEE ARTHROPLASTY Right 04/27/2017   Procedure: RIGHT TOTAL KNEE ARTHROPLASTY;  Surgeon: [name], MD;  Location: [facility];  Service: Orthopedics;  Laterality: Right;  . TOTAL KNEE ARTHROPLASTY WITH REVISION COMPONENTS Left 04/16/2016   Procedure: LEFT TOTAL KNEE ARTHROPLASTY WITH POLY REVISION;  Surgeon: [name], MD;  Location: [facility];  Service: Orthopedics;  Laterality: Left;  with abductor block  . TOTAL SHOULDER ARTHROPLASTY Right 10/16/2016  . TOTAL SHOULDER ARTHROPLASTY Right 10/16/2016    Procedure: TOTAL SHOULDER ARTHROPLASTY;  Surgeon: [name], MD;  Location: [place];  Service: Orthopedics;  Laterality: Right;  . [place]  . VEIN LIGATION AND STRIPPING  2000   Social History   Tobacco Use  . Smoking status: Never Smoker  . Smokeless tobacco: Never Used  Vaping Use  . Vaping Use: Never used  Substance Use Topics  . Alcohol use: No    Comment: rare  . Drug use: No   Family History  Problem Relation Age of Onset  . Arthritis Father   . Cancer Father        prostate CA  . Hypertension Father   . Diabetes Brother   .  Arthritis Mother   . Hypertension Mother   . Cancer Paternal Aunt        breast cancer  . Arthritis Maternal Grandmother   . Hypertension Maternal Grandmother   . Arthritis Maternal Grandfather   . Hypertension Maternal Grandfather   . Diabetes Maternal Grandfather    No Known Allergies Current Outpatient Medications on File Prior to Visit  Medication Sig Dispense Refill  . ALPRAZolam (XANAX) 0.5 MG tablet Take 1 tablet (0.5 mg total) by mouth daily as needed for anxiety. 30 tablet 5  . Calcium 500-100 MG-UNIT CHEW Chew by mouth.    . Cholecalciferol (VITAMIN D) 50 MCG (2000 UT) tablet Take 4,000 Units by mouth daily.    . citalopram (CELEXA) 20 MG tablet Take 1 tablet (20 mg total) by mouth daily. 90 tablet 3  . Cranberry 400 MG CAPS Take by mouth.    . meloxicam (MOBIC) 15 MG tablet Take 1 tablet (15 mg total) by mouth daily as needed for pain. With food 90 tablet 3  . HYDROcodone-acetaminophen (NORCO) 5-325 MG tablet Take 1 tablet by mouth every 4 (four) hours as needed for moderate pain. 20 tablet 0  . ondansetron (ZOFRAN) 4 MG tablet Take 1 tablet (4 mg total) by mouth daily as needed for nausea or vomiting. 30 tablet 1  . SUTAB [phone] MG TABS See admin instructions.    . tamsulosin (FLOMAX) 0.4 MG CAPS capsule Take 1 capsule (0.4 mg total) by mouth daily. 30 capsule 0   No current facility-administered  medications on file prior to visit.    Review of Systems  Constitutional: Negative for activity change, appetite change, fatigue, fever and unexpected weight change.  HENT: Negative for congestion, ear pain, rhinorrhea, sinus pressure and sore throat.   Eyes: Negative for pain, redness and visual disturbance.  Respiratory: Negative for cough, shortness of breath and wheezing.   Cardiovascular: Negative for chest pain and palpitations.  Gastrointestinal: Negative for abdominal pain, blood in stool, constipation and diarrhea.  Endocrine: Negative for polydipsia and polyuria.  Genitourinary: Negative for dysuria, frequency and urgency.  Musculoskeletal: Negative for arthralgias, back pain and myalgias.  Skin: Negative for pallor and rash.  Allergic/Immunologic: Negative for environmental allergies.  Neurological: Positive for numbness. Negative for dizziness, tremors, seizures, syncope, facial asymmetry, speech difficulty, weakness, light-headedness and headaches.       Tingling/altered sensation L face /some pain   Hematological: Negative for adenopathy. Does not bruise/bleed easily.  Psychiatric/Behavioral: Negative for decreased concentration and dysphoric mood. The patient is not nervous/anxious.        Objective:   Physical Exam Constitutional:      General: She is not in acute distress.    Appearance: Normal appearance. She is well-developed. She is obese. She is not ill-appearing or diaphoretic.  HENT:     Head: Normocephalic and atraumatic.     Comments: No sinus tenderness No TMJ tenderness No temporal tenderness    Right Ear: Tympanic membrane, ear canal and external ear normal.     Left Ear: Tympanic membrane, ear canal and external ear normal.     Nose: Nose normal.     Mouth/Throat:     Mouth: Mucous membranes are moist.     Pharynx: No oropharyngeal exudate.  Eyes:     General: No scleral icterus.       Right eye: No discharge.        Left eye: No discharge.      Conjunctiva/sclera: Conjunctivae normal.  Pupils: Pupils are equal, round, and reactive to light.     Comments: No nystagmus  Neck:     Thyroid: No thyromegaly.     Vascular: No carotid bruit or JVD.     Trachea: No tracheal deviation.  Cardiovascular:     Rate and Rhythm: Normal rate and regular rhythm.     Heart sounds: Normal heart sounds. No murmur heard.   Pulmonary:     Effort: Pulmonary effort is normal. No respiratory distress.     Breath sounds: Normal breath sounds. No wheezing or rales.  Abdominal:     General: Bowel sounds are normal. There is no distension.     Palpations: Abdomen is soft. There is no mass.     Tenderness: There is no abdominal tenderness.  Musculoskeletal:        General: No tenderness.     Cervical back: Full passive range of motion without pain, normal range of motion and neck supple. No tenderness.  Lymphadenopathy:     Cervical: No cervical adenopathy.  Skin:    General: Skin is warm and dry.     Coloration: Skin is not jaundiced or pale.     Findings: No erythema or rash.  Neurological:     Mental Status: She is alert and oriented to person, place, and time.     Cranial Nerves: No cranial nerve deficit, dysarthria or facial asymmetry.     Sensory: No sensory deficit.     Motor: No weakness, tremor, atrophy, abnormal muscle tone, seizure activity or pronator drift.     Coordination: Coordination is intact. Coordination normal.     Gait: Gait is intact. Gait normal.     Deep Tendon Reflexes: Reflexes are normal and symmetric. Reflexes normal.     Comments: No focal cerebellar signs   Sensation of L face is altered but not entirely numb   Psychiatric:        Mood and Affect: Mood normal.        Behavior: Behavior normal.        Thought Content: Thought content normal.           Assessment & Plan:   Problem List Items Addressed This Visit      Cardiovascular and Mediastinum   Essential hypertension    bp in fair control at  this time  BP Readings from Last 1 Encounters:  12/06/19 124/88   No changes needed (only medication is generic flomax) Most recent labs reviewed  Disc lifstyle change with low sodium diet and exercise        Relevant Orders   CBC with Differential/Platelet   Basic metabolic panel     Other   Facial paresthesia - Primary    L sided discomfort and paresthesia (cold/tingling/discomfort) intermittent over L face Suspect likely trigeminal neuralgia (handout given)  Reassuring exam  No motor deficits or rash  Lab pending -cmet,cbc and esr  Will likely order MRI of brain  May need neuro input later        Relevant Orders   CBC with Differential/Platelet   Basic metabolic panel   Sedimentation Rate    Other Visit Diagnoses    Need for influenza vaccination       Relevant Orders   Flu Vaccine QUAD High Dose(Fluad) (Completed)

## 2019-12-07 DIAGNOSIS — N202 Calculus of kidney with calculus of ureter: Secondary | ICD-10-CM | POA: Diagnosis not present

## 2019-12-07 DIAGNOSIS — R310 Gross hematuria: Secondary | ICD-10-CM | POA: Diagnosis not present

## 2019-12-07 DIAGNOSIS — N201 Calculus of ureter: Secondary | ICD-10-CM | POA: Diagnosis not present

## 2019-12-07 NOTE — Telephone Encounter (Signed)
Sed rate is not elevated enough for concern.  It is a vague indicator of inflammation and needs to be much higher for concern regarding her symptoms, thanks

## 2019-12-07 NOTE — Telephone Encounter (Signed)
Patient aware of message below no other concerns states that she feels better from this morning. Clarification from this morning patient had an appointment with urologist today instead of neurologist.

## 2019-12-07 NOTE — Telephone Encounter (Signed)
Spoke with patient who verbally understood labs within normal limits. Patient questioning sed rate level which was at 31 informed patient of normal ranges 0-30 patient states that she woke up with kidney issues c/o some pain/discomfort states that she has an appointment with neurologist today. Would like to know if Dr. Glori Bickers have any recommendation for slightly elevated sed rate levels. Please advise.

## 2019-12-13 NOTE — Telephone Encounter (Signed)
Per chart review tab pt had appt on 12/06/19 with Dr Glori Bickers.

## 2019-12-14 ENCOUNTER — Encounter (HOSPITAL_BASED_OUTPATIENT_CLINIC_OR_DEPARTMENT_OTHER): Payer: Self-pay | Admitting: Urology

## 2019-12-15 ENCOUNTER — Other Ambulatory Visit: Payer: Self-pay

## 2019-12-15 ENCOUNTER — Encounter (HOSPITAL_BASED_OUTPATIENT_CLINIC_OR_DEPARTMENT_OTHER): Payer: Self-pay | Admitting: Urology

## 2019-12-15 NOTE — Progress Notes (Signed)
Spoke w/ via phone for pre-op interview--- PT Lab needs dos---- no              Lab results------ has current ekg in epic COVID test ------ @[facility]  12-21-2019 @ 1020 Arrive at ------- 0530 NPO after MN  Medications to take morning of surgery ----- Celexa Diabetic medication ----- n/a Patient Special Instructions ----- n/a Pre-Op special Istructions ----- n/a Patient verbalized understanding of instructions that were given at this phone interview. Patient denies shortness of breath, chest pain, fever, cough at this phone interview.

## 2019-12-19 ENCOUNTER — Other Ambulatory Visit: Payer: Medicare Other

## 2019-12-21 ENCOUNTER — Other Ambulatory Visit
Admission: RE | Admit: 2019-12-21 | Discharge: 2019-12-21 | Disposition: A | Discharge status: Home / Self Care | Payer: Medicare Other | Source: Ambulatory Visit | Attending: Urology | Admitting: Urology

## 2019-12-21 ENCOUNTER — Other Ambulatory Visit: Payer: Self-pay

## 2019-12-21 DIAGNOSIS — Z01812 Encounter for preprocedural laboratory examination: Secondary | ICD-10-CM | POA: Insufficient documentation

## 2019-12-21 DIAGNOSIS — Z20822 Contact with and (suspected) exposure to covid-19: Secondary | ICD-10-CM | POA: Diagnosis not present

## 2019-12-21 LAB — SARS CORONAVIRUS 2 (TAT 6-24 HRS): SARS Coronavirus 2: NEGATIVE

## 2019-12-22 ENCOUNTER — Encounter (HOSPITAL_BASED_OUTPATIENT_CLINIC_OR_DEPARTMENT_OTHER)
Admission: RE | Disposition: A | Discharge status: Home / Self Care | Payer: Self-pay | Source: Home / Self Care | Attending: Urology

## 2019-12-22 ENCOUNTER — Ambulatory Visit (HOSPITAL_BASED_OUTPATIENT_CLINIC_OR_DEPARTMENT_OTHER): Payer: Medicare Other | Admitting: Certified Registered Nurse Anesthetist

## 2019-12-22 ENCOUNTER — Encounter (HOSPITAL_BASED_OUTPATIENT_CLINIC_OR_DEPARTMENT_OTHER): Payer: Self-pay | Admitting: Urology

## 2019-12-22 ENCOUNTER — Ambulatory Visit (HOSPITAL_BASED_OUTPATIENT_CLINIC_OR_DEPARTMENT_OTHER)
Admission: RE | Admit: 2019-12-22 | Discharge: 2019-12-22 | Disposition: A | Discharge status: Home / Self Care | Payer: Medicare Other | Attending: Urology | Admitting: Urology

## 2019-12-22 DIAGNOSIS — Z809 Family history of malignant neoplasm, unspecified: Secondary | ICD-10-CM | POA: Insufficient documentation

## 2019-12-22 DIAGNOSIS — Z79899 Other long term (current) drug therapy: Secondary | ICD-10-CM | POA: Diagnosis not present

## 2019-12-22 DIAGNOSIS — N132 Hydronephrosis with renal and ureteral calculous obstruction: Secondary | ICD-10-CM | POA: Diagnosis not present

## 2019-12-22 DIAGNOSIS — E669 Obesity, unspecified: Secondary | ICD-10-CM | POA: Diagnosis not present

## 2019-12-22 DIAGNOSIS — E559 Vitamin D deficiency, unspecified: Secondary | ICD-10-CM | POA: Diagnosis not present

## 2019-12-22 DIAGNOSIS — Z8042 Family history of malignant neoplasm of prostate: Secondary | ICD-10-CM | POA: Insufficient documentation

## 2019-12-22 DIAGNOSIS — I10 Essential (primary) hypertension: Secondary | ICD-10-CM | POA: Diagnosis not present

## 2019-12-22 DIAGNOSIS — N202 Calculus of kidney with calculus of ureter: Secondary | ICD-10-CM | POA: Diagnosis not present

## 2019-12-22 DIAGNOSIS — R310 Gross hematuria: Secondary | ICD-10-CM | POA: Insufficient documentation

## 2019-12-22 DIAGNOSIS — Z6838 Body mass index (BMI) 38.0-38.9, adult: Secondary | ICD-10-CM | POA: Diagnosis not present

## 2019-12-22 DIAGNOSIS — N201 Calculus of ureter: Secondary | ICD-10-CM | POA: Diagnosis not present

## 2019-12-22 DIAGNOSIS — Z8249 Family history of ischemic heart disease and other diseases of the circulatory system: Secondary | ICD-10-CM | POA: Diagnosis not present

## 2019-12-22 DIAGNOSIS — G4730 Sleep apnea, unspecified: Secondary | ICD-10-CM | POA: Insufficient documentation

## 2019-12-22 DIAGNOSIS — D123 Benign neoplasm of transverse colon: Secondary | ICD-10-CM | POA: Diagnosis not present

## 2019-12-22 HISTORY — DX: Presence of spectacles and contact lenses: Z97.3

## 2019-12-22 HISTORY — DX: Paresthesia of skin: R20.2

## 2019-12-22 HISTORY — PX: CYSTOSCOPY WITH RETROGRADE PYELOGRAM, URETEROSCOPY AND STENT PLACEMENT: SHX5789

## 2019-12-22 HISTORY — DX: Calculus of ureter: N20.1

## 2019-12-22 HISTORY — DX: Obstructive sleep apnea (adult) (pediatric): G47.33

## 2019-12-22 HISTORY — DX: Personal history of other diseases of the circulatory system: Z86.79

## 2019-12-22 SURGERY — CYSTOURETEROSCOPY, WITH RETROGRADE PYELOGRAM AND STENT INSERTION
Anesthesia: General | Laterality: Right

## 2019-12-22 MED ORDER — EPHEDRINE SULFATE-NACL 50-0.9 MG/10ML-% IV SOSY
PREFILLED_SYRINGE | INTRAVENOUS | Status: DC | PRN
  Administered 2019-12-22 (×3): 10 mg via INTRAVENOUS

## 2019-12-22 MED ORDER — DEXAMETHASONE SODIUM PHOSPHATE 10 MG/ML IJ SOLN
INTRAMUSCULAR | Status: DC | PRN
  Administered 2019-12-22: 10 mg via INTRAVENOUS

## 2019-12-22 MED ORDER — MEPERIDINE HCL 25 MG/ML IJ SOLN: 6.2500 mg | INTRAMUSCULAR | Status: DC | PRN

## 2019-12-22 MED ORDER — LACTATED RINGERS IV SOLN: INTRAVENOUS | Status: DC

## 2019-12-22 MED ORDER — DEXAMETHASONE SODIUM PHOSPHATE 10 MG/ML IJ SOLN
INTRAMUSCULAR | Status: AC
  Filled 2019-12-22: qty 1

## 2019-12-22 MED ORDER — PHENYLEPHRINE 40 MCG/ML (10ML) SYRINGE FOR IV PUSH (FOR BLOOD PRESSURE SUPPORT)
PREFILLED_SYRINGE | INTRAVENOUS | Status: DC | PRN
  Administered 2019-12-22 (×4): 80 ug via INTRAVENOUS

## 2019-12-22 MED ORDER — OXYCODONE HCL 5 MG PO TABS: 5.0000 mg | ORAL_TABLET | Freq: Once | ORAL | Status: DC | PRN

## 2019-12-22 MED ORDER — OXYCODONE HCL 5 MG/5ML PO SOLN: 5.0000 mg | Freq: Once | ORAL | Status: DC | PRN

## 2019-12-22 MED ORDER — BELLADONNA ALKALOIDS-OPIUM 16.2-60 MG RE SUPP
RECTAL | Status: DC | PRN
  Administered 2019-12-22: 1 via RECTAL

## 2019-12-22 MED ORDER — ACETAMINOPHEN 10 MG/ML IV SOLN: 1000.0000 mg | Freq: Once | INTRAVENOUS | Status: DC | PRN

## 2019-12-22 MED ORDER — CEFAZOLIN SODIUM-DEXTROSE 2-4 GM/100ML-% IV SOLN
INTRAVENOUS | Status: AC
  Filled 2019-12-22: qty 100

## 2019-12-22 MED ORDER — AMISULPRIDE (ANTIEMETIC) 5 MG/2ML IV SOLN: 10.0000 mg | Freq: Once | INTRAVENOUS | Status: DC | PRN

## 2019-12-22 MED ORDER — LIDOCAINE 2% (20 MG/ML) 5 ML SYRINGE
INTRAMUSCULAR | Status: AC
  Filled 2019-12-22: qty 5

## 2019-12-22 MED ORDER — PHENAZOPYRIDINE HCL 200 MG PO TABS: 200.0000 mg | ORAL_TABLET | Freq: Three times a day (TID) | ORAL | 0 refills | Status: DC | PRN

## 2019-12-22 MED ORDER — FENTANYL CITRATE (PF) 100 MCG/2ML IJ SOLN
INTRAMUSCULAR | Status: AC
  Filled 2019-12-22: qty 2

## 2019-12-22 MED ORDER — IOHEXOL 300 MG/ML  SOLN
INTRAMUSCULAR | Status: DC | PRN
  Administered 2019-12-22: 9 mL via URETHRAL

## 2019-12-22 MED ORDER — PROPOFOL 10 MG/ML IV BOLUS
INTRAVENOUS | Status: DC | PRN
  Administered 2019-12-22: 160 mg via INTRAVENOUS

## 2019-12-22 MED ORDER — FENTANYL CITRATE (PF) 100 MCG/2ML IJ SOLN: 25.0000 ug | INTRAMUSCULAR | Status: DC | PRN

## 2019-12-22 MED ORDER — CEFAZOLIN SODIUM-DEXTROSE 2-4 GM/100ML-% IV SOLN
2.0000 g | INTRAVENOUS | Status: AC
  Administered 2019-12-22: 2 g via INTRAVENOUS

## 2019-12-22 MED ORDER — EPHEDRINE 5 MG/ML INJ
INTRAVENOUS | Status: AC
  Filled 2019-12-22: qty 10

## 2019-12-22 MED ORDER — PROPOFOL 10 MG/ML IV BOLUS
INTRAVENOUS | Status: AC
  Filled 2019-12-22: qty 20

## 2019-12-22 MED ORDER — LIDOCAINE 2% (20 MG/ML) 5 ML SYRINGE
INTRAMUSCULAR | Status: DC | PRN
  Administered 2019-12-22: 40 mg via INTRAVENOUS

## 2019-12-22 MED ORDER — ACETAMINOPHEN 160 MG/5ML PO SOLN: 325.0000 mg | Freq: Once | ORAL | Status: DC | PRN

## 2019-12-22 MED ORDER — ACETAMINOPHEN 325 MG PO TABS: 325.0000 mg | ORAL_TABLET | Freq: Once | ORAL | Status: DC | PRN

## 2019-12-22 MED ORDER — ONDANSETRON HCL 4 MG/2ML IJ SOLN
INTRAMUSCULAR | Status: DC | PRN
  Administered 2019-12-22: 4 mg via INTRAVENOUS

## 2019-12-22 MED ORDER — FENTANYL CITRATE (PF) 100 MCG/2ML IJ SOLN
INTRAMUSCULAR | Status: DC | PRN
  Administered 2019-12-22: 50 ug via INTRAVENOUS
  Administered 2019-12-22: 25 ug via INTRAVENOUS

## 2019-12-22 MED ORDER — BELLADONNA ALKALOIDS-OPIUM 16.2-60 MG RE SUPP
RECTAL | Status: AC
  Filled 2019-12-22: qty 1

## 2019-12-22 MED ORDER — CIPROFLOXACIN HCL 500 MG PO TABS: 500.0000 mg | ORAL_TABLET | Freq: Once | ORAL | 0 refills | Status: AC

## 2019-12-22 MED ORDER — ONDANSETRON HCL 4 MG/2ML IJ SOLN
INTRAMUSCULAR | Status: AC
  Filled 2019-12-22: qty 2

## 2019-12-22 MED ORDER — SODIUM CHLORIDE 0.9 % IR SOLN
Status: DC | PRN
  Administered 2019-12-22: 3000 mL

## 2019-12-22 MED ORDER — MIDAZOLAM HCL 5 MG/5ML IJ SOLN
INTRAMUSCULAR | Status: DC | PRN
  Administered 2019-12-22: 2 mg via INTRAVENOUS

## 2019-12-22 MED ORDER — MIDAZOLAM HCL 2 MG/2ML IJ SOLN
INTRAMUSCULAR | Status: AC
  Filled 2019-12-22: qty 2

## 2019-12-22 SURGICAL SUPPLY — 22 items
BAG DRAIN URO-CYSTO SKYTR STRL (DRAIN) ×2 IMPLANT
BAG DRN UROCATH (DRAIN) ×1
BASKET STONE NCOMPASS (UROLOGICAL SUPPLIES) ×2 IMPLANT
CATH URET 5FR 28IN OPEN ENDED (CATHETERS) ×2 IMPLANT
CATH URET DUAL LUMEN 6-10FR 50 (CATHETERS) ×2 IMPLANT
CLOTH BEACON ORANGE TIMEOUT ST (SAFETY) ×2 IMPLANT
FIBER LASER TRAC TIP (UROLOGICAL SUPPLIES) ×2 IMPLANT
GLOVE BIO SURGEON STRL SZ7 (GLOVE) ×2 IMPLANT
GLOVE BIO SURGEON STRL SZ7.5 (GLOVE) ×2 IMPLANT
GLOVE BIOGEL PI IND STRL 7.0 (GLOVE) ×1 IMPLANT
GLOVE BIOGEL PI INDICATOR 7.0 (GLOVE) ×1
GOWN STRL REUS W/TWL XL LVL3 (GOWN DISPOSABLE) ×2 IMPLANT
GUIDEWIRE STR DUAL SENSOR (WIRE) ×4 IMPLANT
IV NS IRRIG 3000ML ARTHROMATIC (IV SOLUTION) ×4 IMPLANT
KIT TURNOVER CYSTO (KITS) ×2 IMPLANT
MANIFOLD NEPTUNE II (INSTRUMENTS) ×2 IMPLANT
NS IRRIG 500ML POUR BTL (IV SOLUTION) ×2 IMPLANT
PACK CYSTO (CUSTOM PROCEDURE TRAY) ×2 IMPLANT
SHEATH URETERAL 12FRX35CM (MISCELLANEOUS) ×2 IMPLANT
STENT URET 6FRX24 CONTOUR (STENTS) ×2 IMPLANT
TUBE CONNECTING 12X1/4 (SUCTIONS) IMPLANT
TUBING UROLOGY SET (TUBING) ×2 IMPLANT

## 2019-12-22 NOTE — Anesthesia Preprocedure Evaluation (Addendum)
Anesthesia Evaluation  Patient identified by MRN, date of birth, ID band Patient awake    Reviewed: Allergy & Precautions, NPO status , Patient's Chart, lab work & pertinent test results  History of Anesthesia Complications (+) PONV and history of anesthetic complications  Airway Mallampati: III  TM Distance: >3 FB Neck ROM: Full  Mouth opening: Limited Mouth Opening  Dental  (+) Teeth Intact, Dental Advisory Given   Pulmonary sleep apnea ,    breath sounds clear to auscultation       Cardiovascular hypertension,  Rhythm:Regular Rate:Normal     Neuro/Psych PSYCHIATRIC DISORDERS Anxiety Depression    GI/Hepatic negative GI ROS, Neg liver ROS,   Endo/Other  negative endocrine ROS  Renal/GU negative Renal ROS     Musculoskeletal  (+) Arthritis ,   Abdominal (+) + obese,   Peds  Hematology negative hematology ROS (+)   Anesthesia Other Findings   Reproductive/Obstetrics                            Anesthesia Physical Anesthesia Plan  ASA: III  Anesthesia Plan: General   Post-op Pain Management:    Induction: Intravenous  PONV Risk Score and Plan: 4 or greater and Ondansetron, Dexamethasone, Midazolam and Scopolamine patch - Pre-op  Airway Management Planned: LMA  Additional Equipment: None  Intra-op Plan:   Post-operative Plan: Extubation in OR  Informed Consent: I have reviewed the patients History and Physical, chart, labs and discussed the procedure including the risks, benefits and alternatives for the proposed anesthesia with the patient or authorized representative who has indicated his/her understanding and acceptance.     Dental advisory given  Plan Discussed with: CRNA  Anesthesia Plan Comments: (Covid-19 Nucleic Acid Test Results Lab Results      Component                Value               Date                      SARSCOV2NAA              NEGATIVE             12/21/2019                [place]              NEGATIVE            08/19/2019             Lab Results      Component                Value               Date                      WBC                      7.8                 12/06/2019                HGB                      13.4  12/06/2019                HCT                      39.6                12/06/2019                MCV                      87.6                12/06/2019                PLT                      223.0               12/06/2019           )       Anesthesia Quick Evaluation

## 2019-12-22 NOTE — Addendum Note (Signed)
Addendum  created 12/22/19 1231 by Suan Halter, CRNA   Charge Capture section accepted

## 2019-12-22 NOTE — H&P (Signed)
07/15/18:  I was consulted by the above provider to assess the patient who saw blood twice a few weeks ago. There is no pain or cystitis symptoms. She describes a negative culture was given antibiotic   She basically is a nonsmoker. She does not take daily aspirin or blood thinners.   At baseline she is continent voids every 4-5 hours and has no nocturia   She denies a history kidney stones previous GU surgery and bladder infections. No neurologic issues. Has not had a hysterectomy.   07/23/18:  She presents to the office to discuss CT Scan results. She feels well. She is voiding without difficulty. She denies recurrence of gross hematuria. She denies fever, chills, nausea, or vomiting.   02/16/2019: Here today for 6 month follow-up. She denies any interval stone material passage or specific unilateral pain/discomfort. She does tell me she was treated for a UTI back in the fall of last year by an urgent care. She recovered well with appropriate antimicrobial usage. Currently voiding at her baseline but does endorse some increased urinary urgency and occasional urge incontinence over the past 6-8 months. Nocturia stable 1. Denies any new or worsening ability to start her stream. Not having any burning or painful urination or visible blood in urine.   Interval 08/15/19: Patient with above-noted history. She returns today for ongoing follow-up. Overall, she states she has been doing well since prior office visit. She denies any interval complaints of unilateral flank pain or abdominal pain. She denies any passage of stone material. She denies any changes in lower urinary tract symptoms, dysuria, or gross hematuria. No interval episodes of acute cystitis.   Interval 09/06/19: Patient with above noted hx. KUB at prior OV revealed a right UPJ calculus and US showed right hydronephrosis. She underwent right ESWL on 7/19 for management. She states that she has passed multiple fragments since her procedure. She  denies any current flank or abdominal pain. No complaints of dysuria, exacerbation of voiding symptoms, or gross hematuria. She denies fevers, chills, nausea, or vomiting.   09/29/19: Patient with above-noted history. She returns today for ongoing follow-up. She states that she has not passed any stone fragments in the interim. Overall, she continues to do well and denies any interval episodes of flank or abdominal pain. She denies exacerbation of voiding symptoms. No complaints of dysuria, gross hematuria, fever, chills, nausea, or vomiting. Stone composition from fragments she had previously passes noted to be calcium oxalate.   11/21/19: Patient with above-noted history. She returns today for ongoing evaluation. She denies any interval passage of stone material. She denies any current or interval episodes of flank or abdominal pain. She denies dysuria or gross hematuria. She currently feels she is voiding at baseline, though states that she does feel that the force of her stream may have decreased over the past few weeks. No complaints of fever, chills, nausea, or vomiting.   12/07/19: Patient with above-noted history. Recent CT imaging showed an approximately 8 mm right UPJ calculus with associated hydronephrosis. She had additional nonobstructing stones noted in within the right kidney. She presents today with complaints of increased right flank pain earlier today. She states that she had a significant episode of pain this morning, but it has subsided as the day has progressed. She did not use anything for pain management. She states that she is not currently having any significant discomfort while in clinic. She states that she has noted some discoloration of her urine and it is darker  in appearance, but denies any exacerbation in voiding symptoms. No complaints of dysuria. She denies any fevers or chills. No complaints of nausea or vomiting. She does complain of some general fatigue. Urine culture from  10/18 was negative.     ALLERGIES: None   MEDICATIONS: Bariatric Multivitamins  Celexa 20 mg tablet  Meloxicam 15 mg tablet  Vitamin D3     GU PSH: Cystoscopy - 07/15/2018 ESWL, Right - 08/22/2019 Locm 300-399Mg /Ml Iodine,1Ml - 07/21/2018       PSH Notes: Right knee replaced 2019, Bariatric 2018, Left knee 2018 & 2019, Right shoulder replaced 2018, Left knee replaced 2014, Tonsils removed 2007, 2 c sections,    NON-GU PSH: None   GU PMH: Renal calculus - 12/01/2019, - 11/21/2019, - 07/23/2018 Ureteral calculus - 12/01/2019, - 11/21/2019, - 08/15/2019 Microscopic hematuria - 02/16/2019 Gross hematuria - 07/15/2018 Urinary Frequency - 07/15/2018      PMH Notes: Bleeding disorders   NON-GU PMH: Pyuria/other UA findings - 02/16/2019 Anxiety Arthritis Sleep Apnea    FAMILY HISTORY: 2 sons - Son Death In The Family Father - Father Hypertension - Mother, Father melanoma - Brother Prostate Cancer - Father   SOCIAL HISTORY: Marital Status: Single Preferred Language: English Current Smoking Status: Patient has never smoked.   Tobacco Use Assessment Completed: Used Tobacco in last 30 days? Has never drank.  Drinks 3 caffeinated drinks per day.    REVIEW OF SYSTEMS:    GU Review Female:   Patient denies frequent urination, hard to postpone urination, burning /pain with urination, get up at night to urinate, leakage of urine, stream starts and stops, trouble starting your stream, have to strain to urinate, and being pregnant.  Gastrointestinal (Upper):   Patient denies nausea, vomiting, and indigestion/ heartburn.  Gastrointestinal (Lower):   Patient denies diarrhea and constipation.  Constitutional:   Patient denies fever, night sweats, weight loss, and fatigue.  Skin:   Patient denies skin rash/ lesion and itching.  Eyes:   Patient denies blurred vision and double vision.  Ears/ Nose/ Throat:   Patient denies sore throat and sinus problems.  Hematologic/Lymphatic:   Patient  denies swollen glands and easy bruising.  Cardiovascular:   Patient denies leg swelling and chest pains.  Respiratory:   Patient denies cough and shortness of breath.  Endocrine:   Patient denies excessive thirst.  Musculoskeletal:   Patient reports back pain. Patient denies joint pain.  Neurological:   Patient denies dizziness and headaches.  Psychologic:   Patient denies depression and anxiety.   VITAL SIGNS:      12/07/2019 01:16 PM  BP 149/85 mmHg  Pulse 73 /min  Temperature 96.6 F / 35.8 C   MULTI-SYSTEM PHYSICAL EXAMINATION:    Constitutional: Well-nourished. No physical deformities. Normally developed. Good grooming.  Respiratory: Normal breath sounds. No labored breathing, no use of accessory muscles.   Cardiovascular: Regular rate and rhythm. No murmur, no gallop. Normal temperature, normal extremity pulses, no swelling, no varicosities.   Neurologic / Psychiatric: Oriented to time, oriented to place, oriented to person. No depression, no anxiety, no agitation.  Gastrointestinal: No mass, no tenderness, no rigidity, non obese abdomen.  Musculoskeletal: Normal gait and station of head and neck.     Complexity of Data:  Records Review:   Previous Patient Records  Urine Test Review:   Urinalysis, Urine Culture  X-Ray Review: C.T. Abdomen/Pelvis: Reviewed Films. Reviewed Report.     PROCEDURES:          Urinalysis w/Scope  Dipstick Dipstick Cont'd Micro  Color: Brown Bilirubin: Neg mg/dL WBC/hpf: 0 - 5/hpf  Appearance: Cloudy Ketones: Neg mg/dL RBC/hpf: >60/hpf  Specific Gravity: 1.025 Blood: 3+ ery/uL Bacteria: Rare (0-9/hpf)  pH: 6.5 Protein: 2+ mg/dL Cystals: NS (Not Seen)  Glucose: Neg mg/dL Urobilinogen: 0.2 mg/dL Casts: NS (Not Seen)    Nitrites: Neg Trichomonas: Not Present    Leukocyte Esterase: 1+ leu/uL Mucous: Not Present      Epithelial Cells: NS (Not Seen)      Yeast: NS (Not Seen)      Sperm: Not Present    ASSESSMENT:      ICD-10 Details  1 GU:    Renal calculus - N20.0   2   Ureteral calculus - N20.1   3   Gross hematuria - R31.0    PLAN:            Medications New Meds: Hydrocodone-Acetaminophen 5 mg-325 mg tablet 1 tablet PO Q 6 H PRN For severe pain  #20  0 Refill(s)            Orders Labs Urine Culture          Schedule Return Visit/Planned Activity: Keep Scheduled Appointment          Document Letter(s):  Created for Patient: Clinical Summary         Notes:   Urinalysis today is not overtly concerning for infectious process, but I will send precautionary culture given upcoming procedure. She is scheduled for definitive stone management with ureteroscopy on 11/18 with Dr. Louis Meckel. She has been minimally symptomatic up until this point in time. Her most bothersome complaint today is increased pain and discomfort. Therefore, I am going to prescribe medication for pain management. [place] controlled substance database was reviewed. Pain management strategies discussed. Return precautions reviewed in the interim for fevers or refractory pain, nausea, or vomiting. She voiced understanding.

## 2019-12-22 NOTE — Discharge Instructions (Signed)
DISCHARGE INSTRUCTIONS FOR KIDNEY STONE/URETERAL STENT   MEDICATIONS:  1.  Resume all your other meds from home - except do not take any extra narcotic pain meds that you may have at home.  2. Pyridium is to help with the burning/stinging when you urinate. 3. Hydrocodone is for moderate/severe pain, otherwise taking upto 1000 mg every 6 hours of plainTylenol will help treat your pain.   4. Take Cipro one hour prior to removal of your stent.  5. Flomax is to help with ureteral spasm from the stent.  ACTIVITY:  1. No strenuous activity x 1week  2. No driving while on narcotic pain medications  3. Drink plenty of water  4. Continue to walk at home - you can still get blood clots when you are at home, so keep active, but don't over do it.  5. May return to work/school tomorrow or when you feel ready   BATHING:  1. You can shower and we recommend daily showers  2. You have a string coming from your urethra: The stent string is attached to your ureteral stent. Do not pull on this.   SIGNS/SYMPTOMS TO CALL:  Please call us if you have a fever greater than 101.5, uncontrolled nausea/vomiting, uncontrolled pain, dizziness, unable to urinate, bloody urine, chest pain, shortness of breath, leg swelling, leg pain, redness around wound, drainage from wound, or any other concerns or questions.   You can reach [place] at [phone].   FOLLOW-UP:  1. You have an appointment in 6 weeks with a ultrasound of your kidneys prior.   2. You have a string attached to your stent, you may remove it on November 22nd. To do this, pull the strings until the stents are completely removed. You may feel an odd sensation in your back.     Post Anesthesia Home Care Instructions  Activity: Get plenty of rest for the remainder of the day. A responsible adult should stay with you for 24 hours following the procedure.  For the next 24 hours, DO NOT: -Drive a car -Paediatric nurse -Drink alcoholic beverages -Take any  medication unless instructed by your physician -Make any legal decisions or sign important papers.  Meals: Start with liquid foods such as gelatin or soup. Progress to regular foods as tolerated. Avoid greasy, spicy, heavy foods. If nausea and/or vomiting occur, drink only clear liquids until the nausea and/or vomiting subsides. Call your physician if vomiting continues.  Special Instructions/Symptoms: Your throat may feel dry or sore from the anesthesia or the breathing tube placed in your throat during surgery. If this causes discomfort, gargle with warm salt water. The discomfort should disappear within 24 hours.  If you had a scopolamine patch placed behind your ear for the management of post- operative nausea and/or vomiting:  1. The medication in the patch is effective for 72 hours, after which it should be removed.  Wrap patch in a tissue and discard in the trash. Wash hands thoroughly with soap and water. 2. You may remove the patch earlier than 72 hours if you experience unpleasant side effects which may include dry mouth, dizziness or visual disturbances. 3. Avoid touching the patch. Wash your hands with soap and water after contact with the patch.

## 2019-12-22 NOTE — Anesthesia Procedure Notes (Signed)
Procedure Name: LMA Insertion Date/Time: 12/22/2019 7:44 AM Performed by: Rogers Blocker, CRNA Pre-anesthesia Checklist: Patient identified, Emergency Drugs available, Suction available and Patient being monitored Patient Re-evaluated:Patient Re-evaluated prior to induction Oxygen Delivery Method: Circle System Utilized Preoxygenation: Pre-oxygenation with 100% oxygen Induction Type: IV induction Ventilation: Mask ventilation without difficulty LMA: LMA inserted LMA Size: 4.0 Number of attempts: 1 Placement Confirmation: positive ETCO2 Tube secured with: Tape Dental Injury: Teeth and Oropharynx as per pre-operative assessment

## 2019-12-22 NOTE — Anesthesia Postprocedure Evaluation (Signed)
Anesthesia Post Note  Patient: Katrina Huffman  Procedure(s) Performed: CYSTOSCOPY WITH RIGHT RETROGRADE PYELOGRAM, URETEROSCOPY WITH HOLMIUM LASER , BASKET EXTRACTION OF STONES AND STENT PLACEMENT (Right )     Patient location during evaluation: PACU Anesthesia Type: General Level of consciousness: sedated Pain management: pain level controlled Vital Signs Assessment: post-procedure vital signs reviewed and stable Respiratory status: spontaneous breathing and respiratory function stable Cardiovascular status: stable Postop Assessment: no apparent nausea or vomiting Anesthetic complications: no   No complications documented.  Last Vitals:  Vitals:   12/22/19 0915 12/22/19 0930  BP: (!) 115/57 119/68  Pulse: 76 76  Resp: 15 (!) 8  Temp:    SpO2: 96% 93%    Last Pain:  Vitals:   12/22/19 0930  TempSrc:   PainSc: 2                  Merlinda Frederick

## 2019-12-22 NOTE — Op Note (Signed)
Preoperative diagnosis: right ureteral calculus  Postoperative diagnosis: right ureteral calculus  Procedure:  1. Cystoscopy 2. right ureteroscopy and stone removal 3. Ureteroscopic laser lithotripsy 4. right 66F x 24 ureteral stent placement  5. right retrograde pyelography with interpretation  Surgeon: Ardis Hughs, MD  Anesthesia: General  Complications: None  Intraoperative findings:  #1: Right retrograde pyelography demonstrated a filling defect within the right ureter consistent with the patient's known calculus without other abnormalities. #2:  The stone was in the right renal pelvis/UPJ.  There are lots of stone fragments as well, laser was needed for the lead fragment, the remaining stones were small enough to basket and removed.  EBL: Minimal  Specimens: 1. right ureteral calculus  Disposition of specimens: [facility] for stone analysis  Indication: Katrina Huffman is a 64 y.o.   patient with gross hematuria, with a work-up that demonstrated a large right renal stone.  She underwent shockwave lithotripsy and was noted to have passed some stones, but the remainder was at the UVJ.  She also had some small fragments in the midpole calyx.   After reviewing the management options for treatment, the patient elected to proceed with the above surgical procedure(s). We have discussed the potential benefits and risks of the procedure, side effects of the proposed treatment, the likelihood of the patient achieving the goals of the procedure, and any potential problems that might occur during the procedure or recuperation. Informed consent has been obtained.   Description of procedure:  The patient was taken to the operating room and general anesthesia was induced.  The patient was placed in the dorsal lithotomy position, prepped and draped in the usual sterile fashion, and preoperative antibiotics were administered. A preoperative time-out was performed.    Cystourethroscopy was performed.  The patient's urethra was examined and was normal. The bladder was then systematically examined in its entirety. There was no evidence for any bladder tumors, stones, or other mucosal pathology.    Attention then turned to the right ureteral orifice and a ureteral catheter was used to intubate the ureteral orifice.  Omnipaque contrast was injected through the ureteral catheter and a retrograde pyelogram was performed with findings as dictated above.  A 0.38 sensor guidewire was then advanced up the right ureter into the renal pelvis under fluoroscopic guidance.  I then passed the dual-lumen catheter and advanced the second wire into the right renal pelvis.  I subsequently removed the dual-lumen catheter and exchanged for a 12/14 French ureteral access sheath.  This was advanced up to the proximal ureter in the inner portion and guidewire removed.  Then advanced the flexible cystoscope through the sheath and encountered the stone in the UPJ.  The stone was then fragmented with the 200 micron holmium laser fiber on a setting of 1.0 and frequency of 10 Hz.   All stones were then removed from the ureter and renal pelvis with an N-compass nitinol basket.  Reinspection of the ureter revealed no remaining visible stones or fragments.   I then slowly backed out the ureteral access sheath inspecting the ureter noting no significant ureteral trauma or additional stone fragments.  Next I advanced a 24 cm time 6 French double-J ureteral stent over the wire and into the right renal pelvis under fluoroscopic guidance.  Once the wire was noted to be well within the renal pelvis I slowly backed out the wire keeping some gentle pressure at the urethral meatus.  The stent was noted to have a nice  curl within the renal pelvis prior to removing the wire.  I then advanced the beak of the cystoscope and was able to push the distal aspect of the stent into the patient's bladder with a nice  curl noted on fluoroscopy.  The bladder was subsequently emptied and the stent tether was tucked into the patient's vagina.  The patient appeared to tolerate the procedure well and without complications.  The patient was able to be awakened and transferred to the recovery unit in satisfactory condition.   Disposition: The tether of the stent was left on and  tucked inside the patient's vagina.  Instructions for removing the stent have been provided to the patient. The patient has been scheduled for followup in 6 weeks with a renal ultrasound.

## 2019-12-22 NOTE — Transfer of Care (Signed)
Immediate Anesthesia Transfer of Care Note  Patient: Katrina Huffman  Procedure(s) Performed: CYSTOSCOPY WITH RIGHT RETROGRADE PYELOGRAM, URETEROSCOPY WITH HOLMIUM LASER , BASKET EXTRACTION OF STONES AND STENT PLACEMENT (Right )  Patient Location: PACU  Anesthesia Type:General  Level of Consciousness: awake, alert , oriented and patient cooperative  Airway & Oxygen Therapy: Post-op Assessment: Report given to RN and Post -op Vital signs reviewed and stable  Post vital signs: Reviewed and stable  Last Vitals:  Vitals Value Taken Time  BP 134/64 12/22/19 0856  Temp 36.3 C 12/22/19 0856  Pulse 84 12/22/19 0857  Resp 12 12/22/19 0857  SpO2 97 % 12/22/19 0857  Vitals shown include unvalidated device data.  Last Pain:  Vitals:   12/22/19 0607  TempSrc: Oral  PainSc: 0-No pain      Patients Stated Pain Goal: 5 (60/67/70 3403)  Complications: No complications documented.

## 2019-12-22 NOTE — Anesthesia Procedure Notes (Signed)
Performed by: Rogers Blocker, CRNA

## 2019-12-22 NOTE — Interval H&P Note (Signed)
History and Physical Interval Note:  12/22/2019 7:21 AM  Katrina Huffman  has presented today for surgery, with the diagnosis of RIGHT URETERAL STONE.  The various methods of treatment have been discussed with the patient and family. After consideration of risks, benefits and other options for treatment, the patient has consented to  Procedure(s): CYSTOSCOPY WITH RIGHT RETROGRADE PYELOGRAM, URETEROSCOPY WITH HOLMIUM LASER AND STENT PLACEMENT (Right) as a surgical intervention.  The patient's history has been reviewed, patient examined, no change in status, stable for surgery.  I have reviewed the patient's chart and labs.  Questions were answered to the patient's satisfaction.     Ardis Hughs

## 2019-12-23 ENCOUNTER — Encounter (HOSPITAL_BASED_OUTPATIENT_CLINIC_OR_DEPARTMENT_OTHER): Payer: Self-pay | Admitting: Urology

## 2019-12-23 DIAGNOSIS — N200 Calculus of kidney: Secondary | ICD-10-CM | POA: Diagnosis not present

## 2019-12-27 ENCOUNTER — Ambulatory Visit
Admission: RE | Admit: 2019-12-27 | Discharge: 2019-12-27 | Disposition: A | Discharge status: Home / Self Care | Payer: Medicare Other | Source: Ambulatory Visit | Attending: Family Medicine | Admitting: Family Medicine

## 2019-12-27 ENCOUNTER — Other Ambulatory Visit: Payer: Self-pay

## 2019-12-27 DIAGNOSIS — R202 Paresthesia of skin: Secondary | ICD-10-CM | POA: Diagnosis not present

## 2019-12-27 DIAGNOSIS — R519 Headache, unspecified: Secondary | ICD-10-CM | POA: Diagnosis not present

## 2019-12-27 MED ORDER — GADOBENATE DIMEGLUMINE 529 MG/ML IV SOLN
20.0000 mL | Freq: Once | INTRAVENOUS | Status: AC | PRN
  Administered 2019-12-27: 20 mL via INTRAVENOUS

## 2019-12-31 ENCOUNTER — Telehealth: Payer: Self-pay | Admitting: Family Medicine

## 2019-12-31 DIAGNOSIS — R202 Paresthesia of skin: Secondary | ICD-10-CM

## 2019-12-31 NOTE — Telephone Encounter (Signed)
-----   Message from Tammi Sou, [place] sent at 12/28/2019  4:23 PM EST ----- Pt notified of MRI results and Dr. Marliss Coots comments. Pt said she is still having the same sxs she discuss with Dr. Glori Bickers every day. She said some days the sxs are mild and some days they are really intense and feels like 10000 ants are crawling on her. Pt said she does want to proceed with referral she can go to [place] or [place] she just wants to see "the best" and whoever Dr. Glori Bickers recommends. Pt advise PCP will pt referral in and our [facility] will call to schedule appt

## 2020-01-09 ENCOUNTER — Encounter: Payer: Self-pay | Admitting: Neurology

## 2020-01-25 DIAGNOSIS — G4733 Obstructive sleep apnea (adult) (pediatric): Secondary | ICD-10-CM | POA: Diagnosis not present

## 2020-01-31 DIAGNOSIS — N202 Calculus of kidney with calculus of ureter: Secondary | ICD-10-CM | POA: Diagnosis not present

## 2020-02-06 NOTE — Progress Notes (Signed)
NEUROLOGY CONSULTATION NOTE  Katrina Huffman MRN: [id number] DOB: January 27, 1956  Referring provider: Roxy Manns, MD Primary care provider: Roxy Manns, MD  Reason for consult:  Left sided facial paresthesias   Subjective:  Katrina Huffman is a 65 year old right-handed female who left sided facial paresthesias.  History supplemented by referring provider's note.  Symptoms started in late August.  Sitting at computer and suddenly felt a tingling sensation on the left posterior region on the crown.  It feels like "a thousand ants crawling on her head".  She says it feels like a cold sensation that feels burning.  Each episode lasts around 5 minutes several times daily.  Sometimes it only involves the left side of face, the ear, neck.  No electric pain.  No headache, visual disturbance or involvement of extremities.  Sed rate was 31.  MRI of brain with and without contrast on 12/27/2019 personally reviewed and revealed minimal punctate nonenhancing T2/FLAIR foci in the cerebral white matter but no acute intracranial abnormality.      PAST MEDICAL HISTORY: Past Medical History:  Diagnosis Date  . Anxiety   . Arthritis    hands, knees, back  . Depression   . 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 History of 2019 novel coronavirus disease (COVID-19) 02/28/2019   positive results in care everywhere;  12-15-2019  per pt mild symptoms resolved in just over 2 wks with no residual  . History of hypertension    per pt hx dx HTN prior to gastric bypass 12/ 2018,  since lost wt no issue  . OSA (obstructive sleep apnea)    12-15-2019 per pt has not used cpap since 01/ 2021 stated does not need  . PONV (postoperative nausea and vomiting)    PONV after knee arthroplasty  . Right ureteral stone   . 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  Scoliosis    sees chiropracter monthly  . Wears glasses     PAST SURGICAL HISTORY: Past Surgical History:  Procedure Laterality Date  . BREAST SURGERY  1999   breast biopsy  . BUNIONECTOMY  2000  . CESAREAN SECTION  1983; 1985  . COLONOSCOPY WITH PROPOFOL N/A 04/30/2015   Procedure: COLONOSCOPY WITH PROPOFOL;  Surgeon: Midge Minium, MD;  Location: [facility];  Service: Endoscopy;  Laterality: N/A;  . COLONOSCOPY WITH PROPOFOL N/A 05/03/2019   Procedure: COLONOSCOPY WITH PROPOFOL;  Surgeon: Midge Minium, MD;  Location: [facility];  Service: Endoscopy;  Laterality: N/A;  patient was COVID POSITIVE on 02/26/2019  . CYSTOSCOPY WITH RETROGRADE PYELOGRAM, URETEROSCOPY AND STENT PLACEMENT Right 12/22/2019   Procedure: CYSTOSCOPY WITH RIGHT RETROGRADE PYELOGRAM, URETEROSCOPY WITH HOLMIUM LASER , BASKET EXTRACTION OF STONES AND STENT PLACEMENT;  Surgeon: Crist Fat, MD;  Location: [facility];  Service: Urology;  Laterality: Right;  . DIAGNOSTIC LAPAROSCOPY  01/19/2017   LOA w/ Gastropexy/ EGD  . EXTRACORPOREAL SHOCK WAVE LITHOTRIPSY Right 08/22/2019   Procedure: RIGHT EXTRACORPOREAL SHOCK WAVE LITHOTRIPSY (ESWL);  Surgeon: Rene Paci, MD;  Location: [facility];  Service: Urology;  Laterality: Right;  . HYSTEROSCOPY WITH D & C  x2  last one 01/ 2017  . KNEE ARTHROSCOPY Left 05/09/2015   Procedure: ARTHROSCOPY LEFT KNEE WITH SYNOVECTOMY;  Surgeon: Ollen Gross, MD;  Location: [facility];  Service: Orthopedics;  Laterality: Left;  . KNEE ARTHROSCOPY Left 04/27/2017   Procedure: Left knee arthroscopy; synovectomy;  Surgeon: [name], MD;  Location: [facility];  Service: Orthopedics;  Laterality: Left;  . KNEE ARTHROSCOPY Left 2009  . LAPAROSCOPIC GASTRIC SLEEVE RESECTION  01/14/2017  . POLYPECTOMY  04/30/2015   Procedure: POLYPECTOMY;  Surgeon: [name], MD;  Location: [place];  Service: Endoscopy;;  . TONSILLECTOMY AND  ADENOIDECTOMY  2007  . TOTAL KNEE ARTHROPLASTY Right 04/27/2017   Procedure: RIGHT TOTAL KNEE ARTHROPLASTY;  Surgeon: [name], MD;  Location: [facility];  Service: Orthopedics;  Laterality: Right;  . TOTAL KNEE ARTHROPLASTY Left 2014  . TOTAL KNEE ARTHROPLASTY WITH REVISION COMPONENTS Left 04/16/2016   Procedure: LEFT TOTAL KNEE ARTHROPLASTY WITH POLY REVISION;  Surgeon: [name], MD;  Location: [facility];  Service: Orthopedics;  Laterality: Left;  with abductor block  . TOTAL SHOULDER ARTHROPLASTY Right 10/16/2016  . TOTAL SHOULDER ARTHROPLASTY Right 10/16/2016   Procedure: TOTAL SHOULDER ARTHROPLASTY;  Surgeon: [name], MD;  Location: [place];  Service: Orthopedics;  Laterality: Right;  . [place]  . VEIN LIGATION AND STRIPPING  2000    MEDICATIONS: Current Outpatient Medications on File Prior to Visit  Medication Sig Dispense Refill  . ALPRAZolam (XANAX) 0.5 MG tablet Take 1 tablet (0.5 mg total) by mouth daily as needed for anxiety. 30 tablet 5  . Calcium 500-100 MG-UNIT CHEW Chew by mouth.    . Cholecalciferol (VITAMIN D) 50 MCG (2000 UT) tablet Take 4,000 Units by mouth daily.    . citalopram (CELEXA) 20 MG tablet Take 1 tablet (20 mg total) by mouth daily. 90 tablet 3  . Cranberry 400 MG CAPS Take by mouth daily.     Marland Kitchen HYDROcodone-acetaminophen (NORCO) 5-325 MG tablet Take 1 tablet by mouth every 4 (four) hours as needed for moderate pain. 20 tablet 0  . meloxicam (MOBIC) 15 MG tablet Take 1 tablet (15 mg total) by mouth daily as needed for pain. With food (Patient taking differently: Take 15 mg by mouth daily. With food) 90 tablet 3  . Multiple Vitamins-Minerals (BARIATRIC MULTIVITAMINS/IRON) CAPS Take by mouth daily.    . phenazopyridine (PYRIDIUM) 200 MG tablet Take 1 tablet (200 mg total) by mouth 3 (three) times daily as needed for pain. 10 tablet 0  . tamsulosin (FLOMAX) 0.4 MG CAPS capsule Take 1 capsule (0.4 mg total) by mouth daily. (Patient not  taking: Reported on 12/15/2019) 30 capsule 0   No current facility-administered medications on file prior to visit.    ALLERGIES: No Known Allergies  FAMILY HISTORY: Family History  Problem Relation Age of Onset  . Arthritis Father   . Cancer Father        prostate CA  . Hypertension Father   . Diabetes Brother   . Arthritis Mother   . Hypertension Mother   . Cancer Paternal Aunt        breast cancer  . Arthritis Maternal Grandmother   . Hypertension Maternal Grandmother   . Arthritis Maternal Grandfather   . Hypertension Maternal Grandfather   . Diabetes Maternal Grandfather     SOCIAL HISTORY: Social History   Socioeconomic History  . Marital status: Divorced    Spouse name: Not on file  . Number of children: Not on file  . Years of education: Not on file  . Highest education level: Not on file  Occupational History  . Not on file  Tobacco Use  .  Smoking status: Never Smoker  . Smokeless tobacco: Never Used  Vaping Use  . Vaping Use: Never used  Substance and Sexual Activity  . Alcohol use: Not Currently    Comment: rare  . Drug use: Never  . Sexual activity: Not on file  Other Topics Concern  . Not on file  Social History Narrative  . Not on file   Social Determinants of Health   Financial Resource Strain: Not on file  Food Insecurity: Not on file  Transportation Needs: Not on file  Physical Activity: Not on file  Stress: Not on file  Social Connections: Not on file  Intimate Partner Violence: Not on file    Objective:  Blood pressure 117/87, pulse 76, resp. rate 20, height 5\' 5"  (1.651 m), weight 234 lb (106.1 kg), SpO2 98 %. General: No acute distress.  Patient appears well-groomed.   Head:  Normocephalic/atraumatic Eyes:  fundi examined but not visualized Neck: supple, no paraspinal tenderness, full range of motion Back: No paraspinal tenderness Heart: regular rate and rhythm Lungs: Clear to auscultation bilaterally. Vascular: No carotid  bruits. Neurological Exam: Mental status: alert and oriented to person, place, and time, recent and remote memory intact, fund of knowledge intact, attention and concentration intact, speech fluent and not dysarthric, language intact. Cranial nerves: CN I: not tested CN II: pupils equal, round and reactive to light, visual fields intact CN III, IV, VI:  full range of motion, no nystagmus, no ptosis CN V: facial sensation intact. CN VII: upper and lower face symmetric CN VIII: hearing intact CN IX, X: gag intact, uvula midline CN XI: sternocleidomastoid and trapezius muscles intact CN XII: tongue midline Bulk & Tone: normal, no fasciculations. Motor:  muscle strength 5/5 throughout Sensation:  Pinprick, temperature and vibratory sensation intact. Deep Tendon Reflexes:  2+ throughout,  toes downgoing.   Finger to nose testing:  Without dysmetria.   Heel to shin:  Without dysmetria.   Gait:  Normal station and stride.  Romberg negative.  Assessment/Plan:   1.  Atypical left sided scalp/facial neuralgia.  Not classic trigeminal neuralgia and symptoms exceed the distribution of the trigeminal nerve.  1.  Will start gabapentin 100mg  three times daily, we can increase dose in 2 weeks (to 200mg  TID) if needed. 2.  Follow up 6 months.  Thank you for allowing me to take part in the care of this patient.  Metta Clines, DO  CC:  [name], MD

## 2020-02-07 ENCOUNTER — Ambulatory Visit: Payer: Medicare Other | Admitting: Neurology

## 2020-02-07 ENCOUNTER — Encounter: Payer: Self-pay | Admitting: Neurology

## 2020-02-07 ENCOUNTER — Other Ambulatory Visit: Payer: Self-pay

## 2020-02-07 VITALS — BP 117/87 | HR 76 | Resp 20 | Ht 65.0 in | Wt 234.0 lb

## 2020-02-07 DIAGNOSIS — G501 Atypical facial pain: Secondary | ICD-10-CM

## 2020-02-07 MED ORDER — GABAPENTIN 100 MG PO CAPS: 100.0000 mg | ORAL_CAPSULE | Freq: Three times a day (TID) | ORAL | 0 refills | Status: DC

## 2020-02-07 NOTE — Patient Instructions (Signed)
It doesn't quite fit the diagnosis of trigeminal neuralgia.  I would call it atypical facial pain.  Treatment is pretty much the same.  1  Start gabapentin 100mg  three times daily  If no improvement in 2 weeks, contact me 2.  Follow up 6 months

## 2020-02-12 ENCOUNTER — Other Ambulatory Visit: Payer: Self-pay | Admitting: Family Medicine

## 2020-02-12 DIAGNOSIS — F32A Depression, unspecified: Secondary | ICD-10-CM

## 2020-02-12 DIAGNOSIS — F419 Anxiety disorder, unspecified: Secondary | ICD-10-CM

## 2020-02-13 ENCOUNTER — Other Ambulatory Visit: Payer: Self-pay | Admitting: Family Medicine

## 2020-02-13 DIAGNOSIS — Z0000 Encounter for general adult medical examination without abnormal findings: Secondary | ICD-10-CM

## 2020-02-29 ENCOUNTER — Telehealth: Payer: Self-pay | Admitting: Neurology

## 2020-02-29 NOTE — Telephone Encounter (Signed)
Pt states that she was to call back in 2 weeks to let him know how the Gabapentin is not working and she wants to know the next step  Please call

## 2020-03-01 DIAGNOSIS — Z1231 Encounter for screening mammogram for malignant neoplasm of breast: Secondary | ICD-10-CM | POA: Diagnosis not present

## 2020-03-01 MED ORDER — GABAPENTIN 100 MG PO CAPS: ORAL_CAPSULE | ORAL | 0 refills | Status: DC

## 2020-03-01 NOTE — Telephone Encounter (Signed)
Per last ov note: 1.  Will start gabapentin 100mg  three times daily, we can increase dose in 2 weeks (to 200mg  TID) if needed  LMOVM.

## 2020-03-01 NOTE — Telephone Encounter (Signed)
Pt advise refill sent to the pharmacy.

## 2020-03-01 NOTE — Telephone Encounter (Signed)
Correct.  We can increase dose to 300mg  three times daily in one week if needed.

## 2020-03-08 ENCOUNTER — Telehealth: Payer: Self-pay | Admitting: Neurology

## 2020-03-08 NOTE — Telephone Encounter (Signed)
Pt states that she was to call back in a week to let Dr Tomi Likens know how the Gabapentin was helping. She has doubled the dosage as instructed and it has not helped at all. So she will start to take 3 pills 3 times a day. She did have some blurry vision that lasted about 1 hour or so. She states that she is having episodes every day off and on and last night she had a bad one that lasted about 30 mins or so. She feels like the medication is not helping at all.   She will need a corrected RX to be sent in to the [facility] if Dr Tomi Likens will want to keep her on this medication.   She is now taking 3 pills  3 times daily   Please call

## 2020-03-08 NOTE — Telephone Encounter (Signed)
I would now start 300mg  twice daily as recommended.  Update in 2 weeks.

## 2020-03-19 ENCOUNTER — Other Ambulatory Visit: Payer: Self-pay

## 2020-03-19 DIAGNOSIS — Z0000 Encounter for general adult medical examination without abnormal findings: Secondary | ICD-10-CM

## 2020-03-19 MED ORDER — MELOXICAM 15 MG PO TABS: 15.0000 mg | ORAL_TABLET | Freq: Every day | ORAL | 3 refills | Status: DC | PRN

## 2020-03-19 NOTE — Telephone Encounter (Signed)
Pt calling; needs refill sent to [facility]; has been without rx; meloxicam.  [phone]  Adv Meloxicam rx was sent to [facility].  Pt wants it sent to [facility].  Adv will send rx to [facility].

## 2020-03-22 ENCOUNTER — Other Ambulatory Visit: Payer: Self-pay | Admitting: Neurology

## 2020-03-22 MED ORDER — GABAPENTIN 300 MG PO CAPS: ORAL_CAPSULE | ORAL | 0 refills | Status: DC

## 2020-03-22 NOTE — Telephone Encounter (Signed)
Patient called and requested a call back from a nurse to give an update on the medication.

## 2020-03-22 NOTE — Telephone Encounter (Signed)
Return pt call, Pt gives a two week update, Pt feels like to the new doses is helping. Her pain getting better and not intense, but they still come everyday.   So pt wanted to know if she would stay on the 3 pills TID, or if Dr.Jaffe would increase the dose to QID.

## 2020-03-22 NOTE — Telephone Encounter (Signed)
I have increased the dose.  Plan is to increase to goal of 600mg  three times daily.  Take 1 capsule in AM, 1 capsule in afternoon and 2 capsules at bedtime for a week, then 2 capsules in AM, 1 capsule in afternoon, and 2 capsules at bedtime for a week, then 2 capsules three times daily.

## 2020-03-22 NOTE — Telephone Encounter (Signed)
Patient advised of note and new script sent to [place].   My chart message sent as well with instructions.

## 2020-04-20 ENCOUNTER — Telehealth: Payer: Self-pay | Admitting: Neurology

## 2020-04-20 ENCOUNTER — Other Ambulatory Visit: Payer: Self-pay | Admitting: Neurology

## 2020-04-20 MED ORDER — GABAPENTIN 300 MG PO CAPS: 900.0000 mg | ORAL_CAPSULE | Freq: Three times a day (TID) | ORAL | 1 refills | Status: DC

## 2020-04-20 NOTE — Telephone Encounter (Signed)
Patient left a VM stating that she needs to speak to someone about her medication and what it is going on with it. She did not leave the name of the medication   Please call

## 2020-04-20 NOTE — Telephone Encounter (Signed)
If she noted some improvement on 600mg  three times daily, we can increase gabapentin to 900mg  three times daily.  However, she should note that the medication can help with pain and tingling but not the actual numbness/decreased sensation on the face.

## 2020-04-20 NOTE — Telephone Encounter (Signed)
Pt taking 600 mg cap TID=1800. Per pt the pain is still there every day. She had one episode to where is was bad.  The 1800 mg helps but it not getting there. Pt having the numbness on the left side as we speak. She still having the numbness and tingling.   Pt want to know if we should increase the medication or change the medication.    Please advise.

## 2020-04-20 NOTE — Telephone Encounter (Signed)
Pt advised of the note,    Pt wanted to know if there is something help with the numbness.  She will like to increase 900 mg TID.    Please send in the script. Please advise in the note that pt is okay due to new increase.

## 2020-04-20 NOTE — Telephone Encounter (Signed)
Done

## 2020-04-25 ENCOUNTER — Telehealth: Payer: Self-pay | Admitting: Family Medicine

## 2020-04-25 NOTE — Telephone Encounter (Signed)
Printed and copy placed at front for pick up and pt aware

## 2020-04-25 NOTE — Telephone Encounter (Signed)
Patient called in wanted to know if she can get a print out of her shot record due to she is taking her friend to have surgery and they need it show that she has had her Covid vaccine

## 2020-05-07 ENCOUNTER — Encounter: Payer: Self-pay | Admitting: Family Medicine

## 2020-05-07 ENCOUNTER — Ambulatory Visit (INDEPENDENT_AMBULATORY_CARE_PROVIDER_SITE_OTHER): Payer: Medicare Other | Admitting: Family Medicine

## 2020-05-07 ENCOUNTER — Other Ambulatory Visit: Payer: Self-pay

## 2020-05-07 VITALS — BP 119/70 | HR 70 | Temp 97.0°F | Ht 64.5 in | Wt 245.1 lb

## 2020-05-07 DIAGNOSIS — Z0000 Encounter for general adult medical examination without abnormal findings: Secondary | ICD-10-CM | POA: Insufficient documentation

## 2020-05-07 DIAGNOSIS — Z23 Encounter for immunization: Secondary | ICD-10-CM | POA: Diagnosis not present

## 2020-05-07 DIAGNOSIS — E559 Vitamin D deficiency, unspecified: Secondary | ICD-10-CM | POA: Diagnosis not present

## 2020-05-07 DIAGNOSIS — Z9884 Bariatric surgery status: Secondary | ICD-10-CM

## 2020-05-07 DIAGNOSIS — I10 Essential (primary) hypertension: Secondary | ICD-10-CM

## 2020-05-07 DIAGNOSIS — N8500 Endometrial hyperplasia, unspecified: Secondary | ICD-10-CM

## 2020-05-07 DIAGNOSIS — F411 Generalized anxiety disorder: Secondary | ICD-10-CM

## 2020-05-07 DIAGNOSIS — R202 Paresthesia of skin: Secondary | ICD-10-CM | POA: Diagnosis not present

## 2020-05-07 NOTE — Progress Notes (Signed)
Subjective:    Patient ID: Katrina Huffman, female    DOB: Nov 17, 1955, 65 y.o.   MRN: [id number]  This visit occurred during the SARS-CoV-2 public health emergency.  Safety protocols were in place, including screening questions prior to the visit, additional usage of staff PPE, and extensive cleaning of exam room while observing appropriate contact time as indicated for disinfecting solutions.    HPI Pt presents for amw and health mt exam   I have personally reviewed the Medicare Annual Wellness questionnaire and have noted 1. The patient's medical and social history 2. Their use of alcohol, tobacco or illicit drugs 3. Their current medications and supplements 4. The patient's functional ability including ADL's, fall risks, home safety risks and hearing or visual             impairment. 5. Diet and physical activities 6. Evidence for depression or mood disorders  The patients weight, height, BMI have been recorded in the chart and visual acuity is per eye clinic.  I have made referrals, counseling and provided education to the patient based review of the above and I have provided the pt with a written personalized care plan for preventive services. Reviewed and updated provider list, see scanned forms.  See scanned forms.  Routine anticipatory guidance given to patient.  See health maintenance. Colon cancer screening  Colonoscopy 3/21 Breast cancer screening mammogram at [facility]-normal  1/22 Self breast exam-no lumps Flu vaccine 11/21 Tetanus vaccine  Tdap 2/19 Pneumovax -prevnar 3/21   Will get pna 23 today  Zoster vaccine -had shingrix -she did get the 2nd one covid status -pfizer vaccine with booster Dexa 7/19 -has not had one since (does not want one until next mammogram)  Falls  -fell off a chair , had a hematoma (stepping up to decorate)  Oley Balm Supplements-vit D  Due for vitamin D level  She is supposed to go back to bariatric doctor  Exercise-no  regular , wants to start walking again    Advance directive-given materials to work on  Cognitive function addressed- see scanned forms- and if abnormal then additional documentation follows.  No big changes in memory or cognition -- very very busy at work   She bought the [employer] book but has not read it yet  PMH and SH reviewed  Meds, vitals, and allergies reviewed.   ROS: See HPI.  Otherwise negative.    Weight : Wt Readings from Last 3 Encounters:  05/07/20 245 lb 2 oz (111.2 kg)  02/07/20 234 lb (106.1 kg)  12/22/19 233 lb (105.7 kg)   41.43 kg/m Not exercising  Lost motivation  Eating more and poorly   Doing ok overall   Taking high dose of gabapentin for her facial paresthesia  Makes her sleepy  Appetite it up  Now symptoms have subsided but still there (tingling)  Painful episodes are shorter   Has a cyst on her chest- has dermatology visit in august  occ itches in that area from heat    Hearing/vision:  Hearing Screening   125Hz  250Hz  500Hz  1000Hz  2000Hz  3000Hz  4000Hz  6000Hz  8000Hz   Right ear:   40 40 40  40    Left ear:   40 40 40  0    Vision Screening Comments: Pt has yearly eye exams at [facility]. Last eye exam 08/2019 and has appt scheduled this July 2022  She has not noticed a difference   No vision c/o   Care team Tamre Cass -pcp  [place]-neurology  [facility]-urology  HTN bp is stable today  No cp or palpitations or headaches or edema  No medications currently, controls with lifestyle change BP Readings from Last 3 Encounters:  05/07/20 119/70  02/07/20 117/87  12/22/19 (!) 149/76    Pulse Readings from Last 3 Encounters:  05/07/20 70  02/07/20 76  12/22/19 76   Takes meloxicam daily for arthritis  Per pt is ok with her surgeon to re start this   Patient Active Problem List   Diagnosis Date Noted  . Medicare annual wellness visit, initial 05/07/2020  . Facial paresthesia 12/06/2019  . Personal history of colonic polyps   .  Endometrial hyperplasia 04/26/2019  . Welcome to Medicare preventive visit 04/26/2019  . OA (osteoarthritis) of knee 04/27/2017  . Bariatric surgery status 03/06/2017  . S/P shoulder replacement 10/16/2016  . Esophagitis 10/02/2016  . Failed total knee arthroplasty, 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  . Failed total knee arthroplasty ([place]) 04/16/2016  . Bilateral chronic knee pain 03/20/2016  . Vitamin D deficiency 03/05/2016  . Anxiety disorder 03/05/2016  . Pre-operative clearance 03/05/2016  . Essential hypertension 03/05/2016  . 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 Special screening for malignant neoplasms, colon   . Benign neoplasm of transverse colon   . Screening for lipoid disorders 06/18/2010  . Routine general medical examination at a health care facility 06/18/2010  . Morbid obesity ([place]) 06/18/2010   Past Medical History:  Diagnosis Date  . Anxiety   . Arthritis    hands, knees, back  . Depression   . 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 History of 2019 novel coronavirus disease (COVID-19) 02/28/2019   positive results in care everywhere;  12-15-2019  per pt mild symptoms resolved in just over 2 wks with no residual  . History of hypertension    per pt hx dx HTN prior to gastric bypass 12/ 2018,  since lost wt no issue  . OSA (obstructive sleep apnea)    12-15-2019 per pt has not used cpap since 01/ 2021 stated does not need  . PONV (postoperative nausea and vomiting)    PONV after knee arthroplasty  . Right ureteral stone   . 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 Scoliosis    sees chiropracter monthly  . Wears glasses    Past Surgical History:  Procedure Laterality Date  . BREAST SURGERY  1999   breast biopsy  . BUNIONECTOMY  2000  . [place]; 1985  . COLONOSCOPY  WITH PROPOFOL N/A 04/30/2015   Procedure: COLONOSCOPY WITH PROPOFOL;  Surgeon: [name], MD;  Location: [place];  Service: Endoscopy;  Laterality: N/A;  . COLONOSCOPY WITH PROPOFOL N/A 05/03/2019   Procedure: COLONOSCOPY WITH PROPOFOL;  Surgeon: [name], MD;  Location: [facility] ENDOSCOPY;  Service: Endoscopy;  Laterality: N/A;  patient was COVID POSITIVE on 02/26/2019  . CYSTOSCOPY WITH RETROGRADE PYELOGRAM, URETEROSCOPY AND STENT PLACEMENT Right 12/22/2019   Procedure: CYSTOSCOPY WITH RIGHT RETROGRADE PYELOGRAM, URETEROSCOPY WITH HOLMIUM LASER , BASKET EXTRACTION OF STONES AND STENT PLACEMENT;  Surgeon: Ardis Hughs, MD;  Location[facility];  Service: Urology;  Laterality: Right;  . DIAGNOSTIC LAPAROSCOPY  01/19/2017   LOA w/ Gastropexy/ EGD  . EXTRACORPOREAL SHOCK WAVE LITHOTRIPSY Right 08/22/2019   Procedure: RIGHT EXTRACORPOREAL SHOCK  WAVE LITHOTRIPSY (ESWL);  Surgeon: Ceasar Mons, MD;  Location[facility];  Service: Urology;  Laterality: Right;  . HYSTEROSCOPY WITH D & C  x2  last one 01/ 2017  . KNEE ARTHROSCOPY Left 05/09/2015   Procedure: ARTHROSCOPY LEFT KNEE WITH SYNOVECTOMY;  Surgeon: [name], MD;  Location: [facility];  Service: Orthopedics;  Laterality: Left;  . KNEE ARTHROSCOPY Left 04/27/2017   Procedure: Left knee arthroscopy; synovectomy;  Surgeon: [name], MD;  Location: [facility];  Service: Orthopedics;  Laterality: Left;  . KNEE ARTHROSCOPY Left 2009  . LAPAROSCOPIC GASTRIC SLEEVE RESECTION  01/14/2017  . POLYPECTOMY  04/30/2015   Procedure: POLYPECTOMY;  Surgeon: [name], MD;  Location: [place];  Service: Endoscopy;;  . TONSILLECTOMY AND ADENOIDECTOMY  2007  . TOTAL KNEE ARTHROPLASTY Right 04/27/2017   Procedure: RIGHT TOTAL KNEE ARTHROPLASTY;  Surgeon: [name], MD;  Location: [facility];  Service: Orthopedics;  Laterality: Right;  . TOTAL KNEE ARTHROPLASTY Left 2014  . TOTAL KNEE  ARTHROPLASTY WITH REVISION COMPONENTS Left 04/16/2016   Procedure: LEFT TOTAL KNEE ARTHROPLASTY WITH POLY REVISION;  Surgeon: [name], MD;  Location: [facility];  Service: Orthopedics;  Laterality: Left;  with abductor block  . TOTAL SHOULDER ARTHROPLASTY Right 10/16/2016  . TOTAL SHOULDER ARTHROPLASTY Right 10/16/2016   Procedure: TOTAL SHOULDER ARTHROPLASTY;  Surgeon: [name], MD;  Location: [place];  Service: Orthopedics;  Laterality: Right;  . [place]  . VEIN LIGATION AND STRIPPING  2000   Social History   Tobacco Use  . Smoking status: Never Smoker  . Smokeless tobacco: Never Used  Vaping Use  . Vaping Use: Never used  Substance Use Topics  . Alcohol use: Not Currently    Comment: rare  . Drug use: Never   Family History  Problem Relation Age of Onset  . Arthritis Father   . Cancer Father        prostate CA  . Hypertension Father   . Diabetes Brother   . Arthritis Mother   . Hypertension Mother   . Cancer Paternal Aunt        breast cancer  . Arthritis Maternal Grandmother   . Hypertension Maternal Grandmother   . Arthritis Maternal Grandfather   . Hypertension Maternal Grandfather   . Diabetes Maternal Grandfather    No Known Allergies Current Outpatient Medications on File Prior to Visit  Medication Sig Dispense Refill  . ALPRAZolam (XANAX) 0.5 MG tablet Take 1 tablet (0.5 mg total) by mouth daily as needed for anxiety. 30 tablet 5  . Calcium 500-100 MG-UNIT CHEW Chew by mouth.    . Cholecalciferol (VITAMIN D) 50 MCG (2000 UT) tablet Take 4,000 Units by mouth daily.    . citalopram (CELEXA) 20 MG tablet TAKE 1 TABLET BY MOUTH  DAILY 90 tablet 3  . Cranberry 400 MG CAPS Take by mouth daily.     Marland Kitchen gabapentin (NEURONTIN) 300 MG capsule Take 3 capsules (900 mg total) by mouth 3 (three) times daily. 810 capsule 1  . meloxicam (MOBIC) 15 MG tablet Take 1 tablet (15 mg total) by mouth daily as needed for pain. With food 90 tablet 3  . Multiple  Vitamins-Minerals (BARIATRIC MULTIVITAMINS/IRON) CAPS Take by mouth daily.    . phenazopyridine (PYRIDIUM) 200 MG tablet Take 1 tablet (200 mg total) by mouth 3 (three) times daily as needed for pain. 10 tablet 0   No current facility-administered medications on file prior to  visit.    Review of Systems  Constitutional: Positive for fatigue. Negative for activity change, appetite change, fever and unexpected weight change.  HENT: Negative for congestion, ear pain, rhinorrhea, sinus pressure and sore throat.   Eyes: Negative for pain, redness and visual disturbance.  Respiratory: Negative for cough, shortness of breath and wheezing.   Cardiovascular: Negative for chest pain and palpitations.  Gastrointestinal: Negative for abdominal pain, blood in stool, constipation and diarrhea.  Endocrine: Negative for polydipsia and polyuria.  Genitourinary: Negative for dysuria, frequency and urgency.  Musculoskeletal: Positive for arthralgias. Negative for back pain, joint swelling and myalgias.  Skin: Negative for pallor and rash.  Allergic/Immunologic: Negative for environmental allergies.  Neurological: Negative for dizziness, syncope and headaches.  Hematological: Negative for adenopathy. Does not bruise/bleed easily.  Psychiatric/Behavioral: Negative for decreased concentration and dysphoric mood. The patient is not nervous/anxious.        Objective:   Physical Exam Constitutional:      General: She is not in acute distress.    Appearance: Normal appearance. She is well-developed. She is obese. She is not ill-appearing or diaphoretic.  HENT:     Head: Normocephalic and atraumatic.     Right Ear: Tympanic membrane, ear canal and external ear normal.     Left Ear: Tympanic membrane, ear canal and external ear normal.     Nose: Nose normal. No congestion.     Mouth/Throat:     Mouth: Mucous membranes are moist.     Pharynx: Oropharynx is clear. No posterior oropharyngeal erythema.  Eyes:      General: No scleral icterus.    Extraocular Movements: Extraocular movements intact.     Conjunctiva/sclera: Conjunctivae normal.     Pupils: Pupils are equal, round, and reactive to light.  Neck:     Thyroid: No thyromegaly.     Vascular: No carotid bruit or JVD.  Cardiovascular:     Rate and Rhythm: Normal rate and regular rhythm.     Pulses: Normal pulses.     Heart sounds: Normal heart sounds. No gallop.   Pulmonary:     Effort: Pulmonary effort is normal. No respiratory distress.     Breath sounds: Normal breath sounds. No wheezing.     Comments: Good air exch Chest:     Chest wall: No tenderness.  Abdominal:     General: Bowel sounds are normal. There is no distension or abdominal bruit.     Palpations: Abdomen is soft. There is no mass.     Tenderness: There is no abdominal tenderness.     Hernia: No hernia is present.  Genitourinary:    Comments: Breast exam: No mass, nodules, thickening, tenderness, bulging, retraction, inflamation, nipple discharge or skin changes noted.  No axillary or clavicular LA.     Musculoskeletal:        General: No tenderness. Normal range of motion.     Cervical back: Normal range of motion and neck supple. No rigidity. No muscular tenderness.     Right lower leg: No edema.     Left lower leg: No edema.     Comments: No kyphosis   Lymphadenopathy:     Cervical: No cervical adenopathy.  Skin:    General: Skin is warm and dry.     Coloration: Skin is not pale.     Findings: No erythema or rash.     Comments: Solar lentigines diffusely  Small epidermal cyst (1 cm) between breasts Several skin tags  Neurological:  Mental Status: She is alert. Mental status is at baseline.     Cranial Nerves: No cranial nerve deficit.     Motor: No abnormal muscle tone.     Coordination: Coordination normal.     Gait: Gait normal.     Deep Tendon Reflexes: Reflexes are normal and symmetric. Reflexes normal.  Psychiatric:        Mood and Affect: Mood  normal.        Cognition and Memory: Cognition and memory normal.           Assessment & Plan:   Problem List Items Addressed This Visit      Cardiovascular and Mediastinum   Essential hypertension    bp in fair control at this time  BP Readings from Last 1 Encounters:  05/07/20 119/70   No changes needed, no longer on medication Most recent labs reviewed  Disc lifstyle change with low sodium diet and exercise        Relevant Orders   CBC with Differential/Platelet   Comprehensive metabolic panel   Lipid panel   TSH     Genitourinary   Endometrial hyperplasia    Pt sees gyn provider every summer        Other   Routine general medical examination at a health care facility    Reviewed health habits including diet and exercise and skin cancer prevention Reviewed appropriate screening tests for age  Also reviewed health mt list, fam hx and immunization status , as well as social and family history   See HPI Labs ordered Colon and breast cancer screen utd  Interested in [place] with next mammogram (jan 2023) pna 23 vaccine given  covid vaccinated  One fall, no fractures, given info re fall prev in the home  Enc exercise  Given materials to work on advance directive No cognitive concerns, still working Occupational psychologist and vision screen reviewed  Disc plan for eventual wt loss      Morbid obesity ([place])    Discussed how this problem influences overall health and the risks it imposes  Reviewed plan for weight loss with lower calorie diet (via better food choices and also portion control or program like weight watchers) and exercise building up to or more than 30 minutes 5 days per week including some aerobic activity   Not motivated  Plans to return to bariatric surg office  Gaining her weight back  Offered ref to healthy wt and wellness clinic Enc exercise  Offered ref for counseling for emotional eating as well      Vitamin D deficiency    Taking otc D Disc imp to  bone and overall health  Considering dexa at next mammogram time      Relevant Orders   VITAMIN D 25 Hydroxy (Vit-D Deficiency, Fractures)   Anxiety disorder    Continues citalopram  Prn xanax  Reviewed stressors/ coping techniques/symptoms/ support sources/ tx options and side effects in detail today  Work is busy, this is stressful  No time to focus on self care      Bariatric surgery status    Strongly enc pt to f/u with her surgeon (overdue) B12 and D levels added to labs Taking vitamin      Relevant Orders   VITAMIN D 25 Hydroxy (Vit-D Deficiency, Fractures)   Vitamin B12   Facial paresthesia    Sees neurology for atypical facial pain  On gabapentin and starting to improve      Medicare annual wellness visit,  initial - Primary    Reviewed health habits including diet and exercise and skin cancer prevention Reviewed appropriate screening tests for age  Also reviewed health mt list, fam hx and immunization status , as well as social and family history   See HPI Labs ordered Colon and breast cancer screen utd  Interested in [place] with next mammogram (jan 2023) pna 23 vaccine given  covid vaccinated  One fall, no fractures, given info re fall prev in the home  Enc exercise  Given materials to work on advance directive No cognitive concerns, still working Occupational psychologist and vision screen reviewed  Disc plan for eventual wt loss        Other Visit Diagnoses    Need for 23-polyvalent pneumococcal polysaccharide vaccine       Relevant Orders   Pneumococcal polysaccharide vaccine 23-valent greater than or equal to 2yo subcutaneous/IM (Completed)

## 2020-05-07 NOTE — Assessment & Plan Note (Signed)
Taking otc D Disc imp to bone and overall health  Considering dexa at next mammogram time

## 2020-05-07 NOTE — Assessment & Plan Note (Signed)
Discussed how this problem influences overall health and the risks it imposes  Reviewed plan for weight loss with lower calorie diet (via better food choices and also portion control or program like weight watchers) and exercise building up to or more than 30 minutes 5 days per week including some aerobic activity   Not motivated  Plans to return to bariatric surg office  Gaining her weight back  Offered ref to healthy wt and wellness clinic Enc exercise  Offered ref for counseling for emotional eating as well

## 2020-05-07 NOTE — Assessment & Plan Note (Signed)
Sees neurology for atypical facial pain  On gabapentin and starting to improve

## 2020-05-07 NOTE — Patient Instructions (Addendum)
When you are due for your mammogram please call and tell me you need that and bone density test scheduled at [facility] about getting started with walking again  Do it right after work   Labs today  Pneumovax 23 today   Blood pressure was better on 2nd check   Get back to your bariatric doctor See your dermatologist about the cyst  Stay up to date with gyn   Take care of yourself  If you want a referral to [facility] let us know

## 2020-05-07 NOTE — Assessment & Plan Note (Signed)
bp in fair control at this time  BP Readings from Last 1 Encounters:  05/07/20 119/70   No changes needed, no longer on medication Most recent labs reviewed  Disc lifstyle change with low sodium diet and exercise

## 2020-05-07 NOTE — Assessment & Plan Note (Signed)
Strongly enc pt to f/u with her surgeon (overdue) B12 and D levels added to labs Taking vitamin

## 2020-05-07 NOTE — Assessment & Plan Note (Signed)
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

## 2020-05-07 NOTE — Assessment & Plan Note (Signed)
Continues citalopram  Prn xanax  Reviewed stressors/ coping techniques/symptoms/ support sources/ tx options and side effects in detail today  Work is busy, this is stressful  No time to focus on self care

## 2020-05-07 NOTE — Assessment & Plan Note (Signed)
Pt sees gyn provider every summer

## 2020-05-07 NOTE — Assessment & Plan Note (Signed)
Reviewed health habits including diet and exercise and skin cancer prevention Reviewed appropriate screening tests for age  Also reviewed health mt list, fam hx and immunization status , as well as social and family history   See HPI Labs ordered Colon and breast cancer screen utd  Interested in [place] with next mammogram (jan 2023) pna 23 vaccine given  covid vaccinated  One fall, no fractures, given info re fall prev in the home  Enc exercise  Given materials to work on advance directive No cognitive concerns, still working Occupational psychologist and vision screen reviewed  Disc plan for eventual wt loss

## 2020-05-08 LAB — COMPREHENSIVE METABOLIC PANEL
ALT: 13 U/L (ref 0–35)
AST: 17 U/L (ref 0–37)
Albumin: 4.2 g/dL (ref 3.5–5.2)
Alkaline Phosphatase: 98 U/L (ref 39–117)
BUN: 20 mg/dL (ref 6–23)
CO2: 33 mEq/L — ABNORMAL HIGH (ref 19–32)
Calcium: 9.2 mg/dL (ref 8.4–10.5)
Chloride: 102 mEq/L (ref 96–112)
Creatinine, Ser: 0.77 mg/dL (ref 0.40–1.20)
GFR: 80.47 mL/min (ref >60.00)
Glucose, Bld: 81 mg/dL (ref 70–99)
Potassium: 4.2 mEq/L (ref 3.5–5.1)
Sodium: 142 mEq/L (ref 135–145)
Total Bilirubin: 0.4 mg/dL (ref 0.2–1.2)
Total Protein: 6.4 g/dL (ref 6.0–8.3)

## 2020-05-08 LAB — LIPID PANEL
Cholesterol: 199 mg/dL (ref 0–200)
HDL: 61.5 mg/dL (ref >39.00)
LDL Cholesterol: 116 mg/dL — ABNORMAL HIGH (ref 0–99)
NonHDL: 137.08
Total CHOL/HDL Ratio: 3
Triglycerides: 106 mg/dL (ref 0.0–149.0)
VLDL: 21.2 mg/dL (ref 0.0–40.0)

## 2020-05-08 LAB — CBC WITH DIFFERENTIAL/PLATELET
Basophils Absolute: 0.1 10*3/uL (ref 0.0–0.1)
Basophils Relative: 1.4 % (ref 0.0–3.0)
Eosinophils Absolute: 0.2 10*3/uL (ref 0.0–0.7)
Eosinophils Relative: 3.2 % (ref 0.0–5.0)
HCT: 40 % (ref 36.0–46.0)
Hemoglobin: 13.3 g/dL (ref 12.0–15.0)
Lymphocytes Relative: 28 % (ref 12.0–46.0)
Lymphs Abs: 2 10*3/uL (ref 0.7–4.0)
MCHC: 33.3 g/dL (ref 30.0–36.0)
MCV: 87.9 fl (ref 78.0–100.0)
Monocytes Absolute: 0.8 10*3/uL (ref 0.1–1.0)
Monocytes Relative: 12.2 % — ABNORMAL HIGH (ref 3.0–12.0)
Neutro Abs: 3.9 10*3/uL (ref 1.4–7.7)
Neutrophils Relative %: 55.2 % (ref 43.0–77.0)
Platelets: 228 10*3/uL (ref 150.0–400.0)
RBC: 4.55 Mil/uL (ref 3.87–5.11)
RDW: 13.6 % (ref 11.5–15.5)
WBC: 7 10*3/uL (ref 4.0–10.5)

## 2020-05-08 LAB — TSH: TSH: 2.44 u[IU]/mL (ref 0.35–4.50)

## 2020-05-08 LAB — VITAMIN B12: Vitamin B-12: 468 pg/mL (ref 211–911)

## 2020-05-08 LAB — VITAMIN D 25 HYDROXY (VIT D DEFICIENCY, FRACTURES): VITD: 54.27 ng/mL (ref 30.00–100.00)

## 2020-05-29 ENCOUNTER — Telehealth: Payer: Self-pay | Admitting: Neurology

## 2020-05-29 NOTE — Telephone Encounter (Signed)
Pt calling with a update, Pt states she is doing okay right now. The last increase has helped. Pt states her left jaw will have some pain and the top her head will have numbness. But it's not as bad as it was before.    Pt states sometimes she gets really sleepy in the afternoons and increase in her appetite. Are the only side side effects. She has with the medication. And more sententive.

## 2020-05-29 NOTE — Telephone Encounter (Signed)
Patient called and left a message requesting a call back from a nurse to give an update on a medication.

## 2020-07-05 ENCOUNTER — Telehealth: Payer: Self-pay | Admitting: Family Medicine

## 2020-07-05 ENCOUNTER — Telehealth: Payer: Self-pay

## 2020-07-05 NOTE — Telephone Encounter (Signed)
error 

## 2020-07-05 NOTE — Telephone Encounter (Signed)
I spoke with pt; starting on 07/04/20 had pressure feeling, today pt having frequency,urgency and major pressure feeling of needing to urinate; when pt tried to void only voided small amt. Pt has hx of kidney stones. Pt is supposed to go out of town 07/06/20. 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 Pt had little back pain earlier today lower middle back. Not sure if has fever. Pt went to [facility] in [place] but wait was too long and left/ no available appts at [facility] on 07/05/20 and 07/06/20. Pt has a urologist and she will call urologist first otherwise will be seen at an [facility] in [place]. Pt will cb with update. Sending note to Dr Glori Bickers as Juluis Rainier. Also 06/25/20 pt was exposed to covid. Pt has had 2 covid test and the last test was 07/04/20 which both test were neg.

## 2020-07-05 NOTE — Telephone Encounter (Signed)
[facility] Night - Client TELEPHONE ADVICE RECORD [facility] Patient Name: Katrina Huffman Gender: Female DOB: 15-Aug-1955 Age: 65 Y 71 M 3 D Return Phone Number: [phone] (Primary) Address: City/ State/ Zip: [place] [address] Client [place] Night - Client Client Site [facility], Katrina Huffman - MD Contact Type Call Who Is Calling Patient / Member / Family / Caregiver Call Type Triage / Clinical Relationship To Patient Self Return Phone Number [phone] (Primary) Chief Complaint Urination Frequency Reason for Call Symptomatic / Request for [name] states she has a bladder infection. Frequent urgency. Translation No Nurse Assessment Nurse: Katrina Barry, RN, Katrina Huffman Date/Time (Eastern Time): 07/05/2020 9:33:34 AM Confirm and document reason for call. If symptomatic, describe symptoms. ---Caller states she has a bladder infection. Frequent urgency. Started last night when going to bed. Sx include, frequency, burning, mildly uncomfortable. Having pressure. No temp. Does the patient have any new or worsening symptoms? ---Yes Will a triage be completed? ---Yes Related visit to physician within the last 2 weeks? ---No Does the PT have any chronic conditions? (i.e. diabetes, asthma, this includes High risk factors for pregnancy, etc.) ---Yes List chronic conditions. ---chronic joint pain , takes meloxicam Is this a behavioral health or substance abuse call? ---No Guidelines Guideline Title Affirmed Question Affirmed Notes Nurse Date/Time (Eastern Time) Urinary Symptoms Side (flank) or lower back pain present Katrina Barry, RN, Katrina Huffman 07/05/2020 9:36:22 AM Disp. Time Katrina Huffman Time) Disposition Final User 07/05/2020 9:40:15 AM See PCP within 24 Hours Yes [place], RN, Katrina Huffman PLEASE NOTE: All timestamps contained within this report are represented as [place]  Standard Time. CONFIDENTIALTY NOTICE: This fax transmission is intended only for the addressee. It contains information that is legally privileged, confidential or otherwise protected from use or disclosure. If you are not the intended recipient, you are strictly prohibited from reviewing, disclosing, copying using or disseminating any of this information or taking any action in reliance on or regarding this information. If you have received this fax in error, please notify us immediately by telephone so that we can arrange for its return to [place]. Phone: [phone], Toll-Free: [phone], Fax: [phone] Page: 2 of 2 Call Id: [id number] [place] Disagree/Comply Comply Caller Understands Yes PreDisposition Did not know what to do Care Advice Given Per Guideline SEE PCP WITHIN 24 HOURS: * IF OFFICE WILL BE OPEN: You need to be examined within the next 24 hours. Call your doctor (or NP/PA) when the office opens and make an appointment. * ACETAMINOPHEN - REGULAR STRENGTH TYLENOL: Take 650 mg (two 325 mg pills) by mouth every 4 to 6 hours as needed. Each Regular Strength Tylenol pill has 325 mg of acetaminophen. The most you should take each day is 3,250 mg (10 pills a day). CALL BACK IF: * Fever occurs * Unable to urinate and bladder feels full * You become worse CARE ADVICE given per Urinary Symptoms (Adult) guideline. Comments User: Katrina Danker, RN Date/Time Katrina Huffman Time): 07/05/2020 9:35:26 AM Caller has been dx with Covid in the last 10 days. User: Katrina Danker, RN Date/Time Katrina Huffman Time): 07/05/2020 9:43:27 AM Caller was connected with Katrina Huffman at the office. Caller also advised that she has a hx of kidney stones. Referrals REFERRED TO PCP OFFICE

## 2020-07-05 NOTE — Telephone Encounter (Signed)
Aware, thanks - I am out of the office now and tomorrow Will watch for correspondence

## 2020-07-31 DIAGNOSIS — N200 Calculus of kidney: Secondary | ICD-10-CM | POA: Diagnosis not present

## 2020-08-09 NOTE — Progress Notes (Signed)
NEUROLOGY FOLLOW UP OFFICE NOTE  Katrina Huffman [id number]  Assessment/Plan:   Atypical left sided facial pain   Gabapentin 900mg  three times daily - refilled Follow up 6 months.  Subjective:  Katrina Huffman is a 65 year old right-handed female who follows up for left-sided atypical facial pain.  UPDATE: Started on gabapentin, titrated to 900mg  three times daily.  Once on this dose, pain has been controlled.  A couple of breakthrough pain in which she takes hydrocodone.  No longer has tingling on head.  Notes sensitivity in one of her teeth.  Will be seeing the dentist.   HISTORY: Symptoms started in late August 2021.  Sitting at computer and suddenly felt a tingling sensation on the left posterior region on the crown.  It feels like "a thousand ants crawling on her head".  She says it feels like a cold sensation that feels burning.  Each episode lasts around 5 minutes several times daily.  Sometimes it only involves the left side of face, the ear, neck.  No electric pain.  No headache, visual disturbance or involvement of extremities.  Sed rate was 31.  MRI of brain with and without contrast on 12/27/2019 personally reviewed and revealed minimal punctate nonenhancing T2/FLAIR foci in the cerebral white matter but no acute intracranial abnormality.  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   History of 2019 novel coronavirus disease (COVID-19) 02/28/2019   positive results in care everywhere;  12-15-2019  per pt mild symptoms resolved in just over 2 wks with no residual   History of hypertension    per pt hx dx HTN prior to gastric bypass 12/ 2018,  since lost wt no issue   OSA (obstructive sleep apnea)     12-15-2019 per pt has not used cpap since 01/ 2021 stated does not need   PONV (postoperative nausea and vomiting)    PONV after knee arthroplasty   Right ureteral stone    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 30 tablet 5   Calcium 500-100 MG-UNIT CHEW Chew by mouth.     Cholecalciferol (VITAMIN D) 50 MCG (2000 UT) tablet Take 4,000 Units by mouth daily.     citalopram (CELEXA) 20 MG tablet TAKE 1 TABLET BY MOUTH  DAILY 90 tablet 3   Cranberry 400 MG CAPS Take by mouth daily.      gabapentin (NEURONTIN) 300 MG capsule Take 3 capsules (900 mg total) by mouth 3 (three) times daily. 810 capsule 1   meloxicam (MOBIC) 15 MG tablet Take 1 tablet (15 mg total) by mouth daily as needed for pain. With food 90 tablet 3   Multiple Vitamins-Minerals (BARIATRIC MULTIVITAMINS/IRON) CAPS Take by mouth daily.     phenazopyridine (PYRIDIUM) 200 MG tablet Take 1 tablet (200 mg total) by mouth 3 (three) times daily as needed for pain. 10 tablet 0  No current facility-administered medications on file prior to visit.    ALLERGIES: No Known Allergies  FAMILY HISTORY: Family History  Problem Relation Age of Onset   Arthritis Father    Cancer Father        prostate CA   Hypertension Father    Diabetes Brother    Arthritis Mother    Hypertension Mother    Cancer Paternal Aunt        breast cancer   Arthritis Maternal Grandmother    Hypertension Maternal Grandmother    Arthritis Maternal Grandfather    Hypertension Maternal Grandfather    Diabetes Maternal Grandfather       Objective:  Blood pressure 132/80, pulse 77, height 5' 4.5" (1.638 m), weight 242 lb (109.8 kg), SpO2 95 %. General: No acute distress.  Patient appears  well-groomed.    Katrina Clines, DO  CC: Katrina Pardon, MD

## 2020-08-13 ENCOUNTER — Ambulatory Visit: Payer: Medicare Other | Admitting: Neurology

## 2020-08-13 ENCOUNTER — Other Ambulatory Visit: Payer: Self-pay

## 2020-08-13 ENCOUNTER — Encounter: Payer: Self-pay | Admitting: Neurology

## 2020-08-13 VITALS — BP 132/80 | HR 77 | Ht 64.5 in | Wt 242.0 lb

## 2020-08-13 DIAGNOSIS — G501 Atypical facial pain: Secondary | ICD-10-CM

## 2020-08-13 MED ORDER — GABAPENTIN 300 MG PO CAPS: 900.0000 mg | ORAL_CAPSULE | Freq: Three times a day (TID) | ORAL | 1 refills | Status: DC

## 2020-08-13 NOTE — Patient Instructions (Signed)
Gabapentin 900mg  three times daily refilled Follow up 6 months.

## 2020-08-14 DIAGNOSIS — H2513 Age-related nuclear cataract, bilateral: Secondary | ICD-10-CM | POA: Diagnosis not present

## 2020-09-04 ENCOUNTER — Other Ambulatory Visit: Payer: Self-pay | Admitting: Neurology

## 2020-09-04 ENCOUNTER — Other Ambulatory Visit: Payer: Self-pay

## 2020-09-04 ENCOUNTER — Telehealth: Payer: Self-pay | Admitting: Obstetrics and Gynecology

## 2020-09-04 ENCOUNTER — Ambulatory Visit (INDEPENDENT_AMBULATORY_CARE_PROVIDER_SITE_OTHER): Payer: Medicare Other | Admitting: Obstetrics and Gynecology

## 2020-09-04 ENCOUNTER — Encounter: Payer: Self-pay | Admitting: Obstetrics and Gynecology

## 2020-09-04 VITALS — BP 132/74 | Ht 65.0 in | Wt 246.4 lb

## 2020-09-04 DIAGNOSIS — Z1231 Encounter for screening mammogram for malignant neoplasm of breast: Secondary | ICD-10-CM | POA: Diagnosis not present

## 2020-09-04 DIAGNOSIS — Z0000 Encounter for general adult medical examination without abnormal findings: Secondary | ICD-10-CM

## 2020-09-04 NOTE — Progress Notes (Signed)
Gynecology Annual Exam  PCP: Tower, [name], MD  Chief Complaint:  Chief Complaint  Patient presents with   Gynecologic Exam    History of Present Illness: Patient is a 65 y.o. No obstetric history on file. presents for annual exam. The patient has no complaints today.   LMP: No LMP recorded. Patient is postmenopausal. She denies postmenopausal bleeding or spotting  The patient is not currently sexually active.    The patient does perform self breast exams.  There is no notable family history of breast or ovarian cancer in her family.  The patient has regular exercise: none, considering starting water aerobics  The patient denies current symptoms of depression.   She reports that she has had a nodule at her sternum for the last year.  She reports that follow-up with dermatology was the plan although the dermatologist did not accept her insurance and therefore she is not able to see them.  She has a appointment with a new dermatologist planned for 09/20/2020.  She reports that the nodule has gone down in size since last year.  Review of Systems: Review of Systems  Constitutional:  Negative for chills, fever, malaise/fatigue and weight loss.  HENT:  Negative for congestion, hearing loss and sinus pain.   Eyes:  Negative for blurred vision and double vision.  Respiratory:  Negative for cough, sputum production, shortness of breath and wheezing.   Cardiovascular:  Negative for chest pain, palpitations, orthopnea and leg swelling.  Gastrointestinal:  Negative for abdominal pain, constipation, diarrhea, nausea and vomiting.  Genitourinary:  Negative for dysuria, flank pain, frequency, hematuria and urgency.  Musculoskeletal:  Negative for back pain, falls and joint pain.  Skin:  Negative for itching and rash.  Neurological:  Negative for dizziness and headaches.  Psychiatric/Behavioral:  Negative for depression, substance abuse and suicidal ideas. The patient is not nervous/anxious.     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   History of 2019 novel coronavirus disease (COVID-19) 02/28/2019   positive results in care everywhere;  12-15-2019  per pt mild symptoms resolved in just over 2 wks with no residual   History of hypertension    per pt hx dx HTN prior to gastric bypass 12/ 2018,  since lost wt no issue   OSA (obstructive sleep apnea)    12-15-2019 per pt has not used cpap since 01/ 2021 stated does not need   PONV (postoperative nausea and vomiting)    PONV after knee arthroplasty   Right ureteral stone    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   Scoliosis    sees chiropracter monthly   Wears glasses     Past Surgical History:  Past Surgical History:  Procedure Laterality Date   BREAST SURGERY  1999   breast biopsy   BUNIONECTOMY  2000   [place]; 1985   COLONOSCOPY WITH PROPOFOL N/A 04/30/2015   Procedure: COLONOSCOPY WITH PROPOFOL;  Surgeon: [name], MD;  Location: [facility];  Service: Endoscopy;  Laterality: N/A;   COLONOSCOPY WITH PROPOFOL N/A 05/03/2019   Procedure: COLONOSCOPY WITH PROPOFOL;  Surgeon: [name], MD;  Location: [facility];  Service: Endoscopy;  Laterality: N/A;  patient was COVID POSITIVE on 02/26/2019   CYSTOSCOPY WITH RETROGRADE PYELOGRAM, URETEROSCOPY AND STENT PLACEMENT Right 12/22/2019   Procedure: CYSTOSCOPY WITH RIGHT RETROGRADE PYELOGRAM, URETEROSCOPY WITH HOLMIUM LASER , BASKET EXTRACTION OF STONES AND STENT PLACEMENT;  Surgeon: Ardis Hughs, MD;  Location[facility];  Service: Urology;  Laterality: Right;   DIAGNOSTIC LAPAROSCOPY  01/19/2017   LOA w/ Gastropexy/ EGD   EXTRACORPOREAL SHOCK WAVE  LITHOTRIPSY Right 08/22/2019   Procedure: RIGHT EXTRACORPOREAL SHOCK WAVE LITHOTRIPSY (ESWL);  Surgeon: Ceasar Mons, MD;  [facility];  Service: Urology;  Laterality: Right;   HYSTEROSCOPY WITH D & C  x2  last one 01/ 2017   KNEE ARTHROSCOPY Left 05/09/2015   Procedure: ARTHROSCOPY LEFT KNEE WITH SYNOVECTOMY;  Surgeon: [name], MD;  Location: [facility];  Service: Orthopedics;  Laterality: Left;   KNEE ARTHROSCOPY Left 04/27/2017   Procedure: Left knee arthroscopy; synovectomy;  Surgeon: [name], MD;  Location: [facility];  Service: Orthopedics;  Laterality: Left;   KNEE ARTHROSCOPY Left 2009   LAPAROSCOPIC GASTRIC SLEEVE RESECTION  01/14/2017   POLYPECTOMY  04/30/2015   Procedure: POLYPECTOMY;  Surgeon: [name], MD;  Location: [facility];  Service: Endoscopy;;   TONSILLECTOMY AND ADENOIDECTOMY  2007   TOTAL KNEE ARTHROPLASTY Right 04/27/2017   Procedure: RIGHT TOTAL KNEE ARTHROPLASTY;  Surgeon: [name], MD;  Location: [facility];  Service: Orthopedics;  Laterality: Right;   TOTAL KNEE ARTHROPLASTY Left 2014   TOTAL KNEE ARTHROPLASTY WITH REVISION COMPONENTS Left 04/16/2016   Procedure: LEFT TOTAL KNEE ARTHROPLASTY WITH POLY REVISION;  Surgeon: [name], MD;  Location: [facility];  Service: Orthopedics;  Laterality: Left;  with abductor block   TOTAL SHOULDER ARTHROPLASTY Right 10/16/2016   TOTAL SHOULDER ARTHROPLASTY Right 10/16/2016   Procedure: TOTAL SHOULDER ARTHROPLASTY;  Surgeon: [name], MD;  Location: [facility];  Service: Orthopedics;  Laterality: Right;   [place]    Gynecologic History:  No LMP recorded. Patient is postmenopausal.  Obstetric History: No obstetric history on file.  Family History:  Family History  Problem Relation Age of Onset   Arthritis Father    Cancer Father        prostate CA   Hypertension Father    Diabetes Brother    Arthritis Mother     Hypertension Mother    Cancer Paternal Aunt        breast cancer   Arthritis Maternal Grandmother    Hypertension Maternal Grandmother    Arthritis Maternal Grandfather    Hypertension Maternal Grandfather    Diabetes Maternal Grandfather     Social History:  Social History   Socioeconomic History   Marital status: Divorced    Spouse name: Not on file   Number of children: Not on file   Years of education: Not on file   Highest education level: Not on file  Occupational History   Not on file  Tobacco Use   Smoking status: Never   Smokeless tobacco: Never  Vaping Use   Vaping Use: Never used  Substance and Sexual Activity   Alcohol use: Not Currently    Comment: rare   Drug use: Never   Sexual activity: Not on file  Other Topics Concern   Not on file  Social History Narrative   Right handed   Drinks caffeine   One story home  Social Determinants of Health   Financial Resource Strain: Not on file  Food Insecurity: Not on file  Transportation Needs: Not on file  Physical Activity: Not on file  Stress: Not on file  Social Connections: Not on file  Intimate Partner Violence: Not on file    Allergies:  No Known Allergies  Medications: Prior to Admission medications   Medication Sig Start Date End Date Taking? Authorizing Provider  ALPRAZolam [name]) 0.5 MG tablet Take 1 tablet (0.5 mg total) by mouth daily as needed for anxiety. 08/31/19  Yes Javelle Donigan, Stefanie Libel, MD  Calcium 500-100 MG-UNIT CHEW Chew by mouth.   Yes [provider]  Cholecalciferol (VITAMIN D) 50 MCG (2000 UT) tablet Take 4,000 Units by mouth daily.   Yes [provider]  citalopram (CELEXA) 20 MG tablet TAKE 1 TABLET BY MOUTH  DAILY 02/13/20  Yes Tower, [name], MD  Cranberry 400 MG CAPS Take by mouth daily.  07/19/19  Yes [provider]  gabapentin (NEURONTIN) 300 MG capsule Take 3 capsules (900 mg total) by mouth 3 (three) times daily. 08/13/20  Yes Tomi Likens, Adam R, DO   meloxicam (MOBIC) 15 MG tablet Take 1 tablet (15 mg total) by mouth daily as needed for pain. With food 03/19/20  Yes Pranshu Lyster, Stefanie Libel, MD  Multiple Vitamins-Minerals (BARIATRIC MULTIVITAMINS/IRON) CAPS Take by mouth daily.   Yes [provider]  phenazopyridine (PYRIDIUM) 200 MG tablet Take 1 tablet (200 mg total) by mouth 3 (three) times daily as needed for pain. Patient not taking: Reported on 08/13/2020 12/22/19   Ardis Hughs, MD    Physical Exam Vitals: Blood pressure 132/74, height '5\' 5"'$  (1.651 m), weight 246 lb 6.4 oz (111.8 kg).  Physical Exam Constitutional:      Appearance: She is well-developed.  Genitourinary:  Breasts:    Right: No axillary adenopathy or supraclavicular adenopathy.     Left: No axillary adenopathy or supraclavicular adenopathy.  HENT:     Head: Normocephalic and atraumatic.  Neck:     Thyroid: No thyromegaly.  Cardiovascular:     Rate and Rhythm: Normal rate and regular rhythm.     Heart sounds: Normal heart sounds.  Pulmonary:     Effort: Pulmonary effort is normal.     Breath sounds: Normal breath sounds.  Chest:     Chest wall: No tenderness, crepitus or edema.     Comments: breast equal without skin changes, nipple discharge, breast lump or enlarged lymph nodes  Abdominal:     General: Bowel sounds are normal. There is no distension.     Palpations: Abdomen is soft. There is no mass.  Musculoskeletal:     Cervical back: Neck supple.  Lymphadenopathy:     Upper Body:     Right upper body: No supraclavicular, axillary or pectoral adenopathy.     Left upper body: No supraclavicular, axillary or pectoral adenopathy.  Neurological:     Mental Status: She is alert and oriented to person, place, and time.  Skin:    General: Skin is warm and dry.       Psychiatric:        Behavior: Behavior normal.        Thought Content: Thought content normal.        Judgment: Judgment normal.  Vitals reviewed.     Female chaperone  present for pelvic and breast  portions of the physical exam  Assessment: 65 y.o. No obstetric history on file. routine annual exam  Plan: Problem List Items Addressed This Visit   None Visit Diagnoses     Health maintenance examination    -  Primary   Breast cancer screening by mammogram       Relevant Orders   MM 3D SCREEN BREAST BILATERAL       1) Mammogram - recommend yearly screening mammogram.  Mammogram Was ordered today  2) STI screening was offered and declined  3) ASCCP guidelines and rational discussed.  Pap is up to date.   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 Recommended IUD exchange since it has been 5 years since placement and surveillance biopsy, she will return for this.   7) Discussed skin nodule located at the sternum, left of midline.  Follow-up with dermatology as planned and if they are able to biopsy this skin lesion then that will be sufficient.  If they do not feel comfortable biopsying I will refer her to general surgery for evaluation.  Adrian Prows MD, [name], [facility] 09/04/2020 5:35 PM

## 2020-09-04 NOTE — Telephone Encounter (Signed)
Pt is scheduled with [facility] on August 26 for a Mirena exchange at 2:50.

## 2020-09-04 NOTE — Patient Instructions (Signed)
Exercising to Stay Healthy To become healthy and stay healthy, it is recommended that you do moderate-intensity and vigorous-intensity exercise. You can tell that you are exercising at a moderate intensity if your heart starts beating faster and you start breathing faster but can still hold a conversation. You can tell that you are exercising at a vigorous intensity if you are breathing much harder andfaster and cannot hold a conversation while exercising. Exercising regularly is important. It has many health benefits, such as: Improving overall fitness, flexibility, and endurance. Increasing bone density. Helping with weight control. Decreasing body fat. Increasing muscle strength. Reducing stress and tension. Improving overall health. How often should I exercise? Choose an activity that you enjoy, and set realistic goals. Your health careprovider can help you make an activity plan that works for you. Exercise regularly as told by your health care provider. This may include: Doing strength training two times a week, such as: Lifting weights. Using resistance bands. Push-ups. Sit-ups. Yoga. Doing a certain intensity of exercise for a given amount of time. Choose from these options: A total of 150 minutes of moderate-intensity exercise every week. A total of 75 minutes of vigorous-intensity exercise every week. A mix of moderate-intensity and vigorous-intensity exercise every week. Children, pregnant women, people who have not exercised regularly, people who are overweight, and older adults may need to talk with a health care provider about what activities are safe to do. If you have a medical condition, be sureto talk with your health care provider before you start a new exercise program. What are some exercise ideas? Moderate-intensity exercise ideas include: Walking 1 mile (1.6 km) in about 15 minutes. Biking. Hiking. Golfing. Dancing. Water aerobics. Vigorous-intensity exercise  ideas include: Walking 4.5 miles (7.2 km) or more in about 1 hour. Jogging or running 5 miles (8 km) in about 1 hour. Biking 10 miles (16.1 km) or more in about 1 hour. Lap swimming. Roller-skating or in-line skating. Cross-country skiing. Vigorous competitive sports, such as football, basketball, and soccer. Jumping rope. Aerobic dancing. What are some everyday activities that can help me to get exercise? Yard work, such as: Pushing a lawn mower. Raking and bagging leaves. Washing your car. Pushing a stroller. Shoveling snow. Gardening. Washing windows or floors. How can I be more active in my day-to-day activities? Use stairs instead of an elevator. Take a walk during your lunch break. If you drive, park your car farther away from your work or school. If you take public transportation, get off one stop early and walk the rest of the way. Stand up or walk around during all of your indoor phone calls. Get up, stretch, and walk around every 30 minutes throughout the day. Enjoy exercise with a friend. Support to continue exercising will help you keep a regular routine of activity. What guidelines can I follow while exercising? Before you start a new exercise program, talk with your health care provider. Do not exercise so much that you hurt yourself, feel dizzy, or get very short of breath. Wear comfortable clothes and wear shoes with good support. Drink plenty of water while you exercise to prevent dehydration or heat stroke. Work out until your breathing and your heartbeat get faster. Where to find more information [place] Department of Health and Human Services: www.hhs.gov Centers for Disease Control and Prevention (CDC): www.cdc.gov Summary Exercising regularly is important. It will improve your overall fitness, flexibility, and endurance. Regular exercise also will improve your overall health. It can help you control your weight,   reduce stress, and improve your bone  density. Do not exercise so much that you hurt yourself, feel dizzy, or get very short of breath. Before you start a new exercise program, talk with your health care provider. This information is not intended to replace advice given to you by your health care provider. Make sure you discuss any questions you have with your healthcare provider. Document Revised: 01/06/2020 Document Reviewed: 01/06/2020 Elsevier Patient Education  2022 [employer]. Budget-Friendly Healthy Eating There are many ways to save money at the grocery store and continue to eat healthy. You can be successful if you: Plan meals according to your budget. Make a grocery list and only purchase food according to your grocery list. Prepare food yourself at home. What are tips for following this plan? Reading food labels Compare food labels between brand name foods and the store brand. Often the nutritional value is the same, but the store brand is lower cost. Look for products that do not have added sugar, fat, or salt (sodium). These often cost the same but are healthier for you. Products may be labeled as: Sugar-free. Nonfat. Low-fat. Sodium-free. Low-sodium. Look for lean ground beef labeled as at least 92% lean and 8% fat. Shopping  Buy only the items on your grocery list and go only to the areas of the store that have the items on your list. Use coupons only for foods and brands you normally buy. Avoid buying items you wouldn't normally buy simply because they are on sale. Check online and in newspapers for weekly deals. Buy healthy items from the bulk bins when available, such as herbs, spices, flour, pasta, nuts, and dried fruit. Buy fruits and vegetables that are in season. Prices are usually lower on in-season produce. Look at the unit price on the price tag. Use it to compare different brands and sizes to find out which item is the best deal. Choose healthy items that are often low-cost, such as carrots,  potatoes, apples, bananas, and oranges. Dried or canned beans are a low-cost protein source. Buy in bulk and freeze extra food. Items you can buy in bulk include meats, fish, poultry, frozen fruits, and frozen vegetables. Avoid buying "ready-to-eat" foods, such as pre-cut fruits and vegetables and pre-made salads. If possible, shop around to discover where you can find the best prices. Consider other retailers such as dollar stores, larger wholesale stores, local fruit and vegetable stands, and farmers markets. Do not shop when you are hungry. If you shop while hungry, it may be hard to stick to your list and budget. Resist impulse buying. Use your grocery list as your official plan for the week. Buy a variety of vegetables and fruits by purchasing fresh, frozen, and canned items. Look at the top and bottom shelves for deals. Foods at eye level (eye level of an adult or child) are usually more expensive. Be efficient with your time when shopping. The more time you spend at the store, the more money you are likely to spend. To save money when choosing more expensive foods like meats and dairy: Choose cheaper cuts of meat, such as bone-in chicken thighs and drumsticks instead of skinless and boneless chicken. When you are ready to prepare the chicken, you can remove the skin yourself to make it healthier. Choose lean meats like chicken or turkey instead of beef. Choose canned seafood, such as tuna, salmon, or sardines. Buy eggs as a low-cost source of protein. Buy dried beans and peas, such as lentils, split   peas, or kidney beans instead of meats. Dried beans and peas are a good alternative source of protein. Buy the larger tubs of yogurt instead of individual-sized containers. Choose water instead of sodas and other sweetened beverages. Avoid buying chips, cookies, and other "junk food." These items are usually expensive and not healthy.  Cooking Make extra food and freeze the extras in meal-sized  containers or in individual portions for fast meals and snacks. Pre-cook on days when you have extra time to prepare meals in advance. You can keep these meals in the fridge or freezer and reheat for a quick meal. When you come home from the grocery store, wash, peel, and cut fruits and vegetables so they are ready to use and eat. This will help reduce food waste. Meal planning Do not eat out or get fast food. Prepare food at home. Make a grocery list and make sure to bring it with you to the store. If you have a smart phone, you could use your phone to create your shopping list. Plan meals and snacks according to a grocery list and budget you create. Use leftovers in your meal plan for the week. Look for recipes where you can cook once and make enough food for two meals. Prepare budget-friendly types of meals like stews, casseroles, and stir-fry dishes. Try some meatless meals or try "no cook" meals like salads. Make sure that half your plate is filled with fruits or vegetables. Choose from fresh, frozen, or canned fruits and vegetables. If eating canned, remember to rinse them before eating. This will remove any excess salt added for packaging. Summary Eating healthy on a budget is possible if you plan your meals according to your budget, purchase according to your budget and grocery list, and prepare food yourself. Tips for buying more food on a limited budget include buying generic brands, using coupons only for foods you normally buy, and buying healthy items from the bulk bins when available. Tips for buying cheaper food to replace expensive food include choosing cheaper, lean cuts of meat, and buying dried beans and peas. This information is not intended to replace advice given to you by your health care provider. Make sure you discuss any questions you have with your healthcare provider. Document Revised: 11/03/2019 Document Reviewed: 11/03/2019 Elsevier Patient Education  2022 Elsevier  Inc. Bone Health Bones protect organs, store calcium, anchor muscles, and support the whole body. Keeping your bones strong is important, especially as you get older. Youcan take actions to help keep your bones strong and healthy. Why is keeping my bones healthy important?  Keeping your bones healthy is important because your body constantly replaces bone cells. Cells get old, and new cells take their place. As we age, we lose bone cells because the body may not be able to make enough new cells to replace the old cells. The amount of bone cells and bone tissue you have is referred toas bone mass. The higher your bone mass, the stronger your bones. The aging process leads to an overall loss of bone mass in the body, which can increase the likelihood of: Joint pain and stiffness. Broken bones. A condition in which the bones become weak and brittle (osteoporosis). A large decline in bone mass occurs in older adults. In women, it occurs aboutthe time of menopause. What actions can I take to keep my bones healthy? Good health habits are important for maintaining healthy bones. This includes eating nutritious foods and exercising regularly. To have healthy bones,   you need to get enough of the right minerals and vitamins. Most nutrition experts recommend getting these nutrients from the foods that you eat. In some cases, taking supplements may also be recommended. Doing certain types of exercise isalso important for bone health. What are the nutritional recommendations for healthy bones?  Eating a well-balanced diet with plenty of calcium and vitamin D will help to protect your bones. Nutritional recommendations vary from person to person. Ask your health care provider what is healthy for you. Here are some generalguidelines. Get enough calcium Calcium is the most important (essential) mineral for bone health. Most people can get enough calcium from their diet, but supplements may be recommended for people  who are at risk for osteoporosis. Good sources of calcium include: Dairy products, such as low-fat or nonfat milk, cheese, and yogurt. Dark green leafy vegetables, such as bok choy and broccoli. Calcium-fortified foods, such as orange juice, cereal, bread, soy beverages, and tofu products. Nuts, such as almonds. Follow these recommended amounts for daily calcium intake: Children, age 1-3: 700 mg. Children, age 4-8: 1,000 mg. Children, age 9-13: 1,300 mg. Teens, age 14-18: 1,300 mg. Adults, age 19-50: 1,000 mg. Adults, age 51-70: Men: 1,000 mg. Women: 1,200 mg. Adults, age 71 or older: 1,200 mg. Pregnant and breastfeeding females: Teens: 1,300 mg. Adults: 1,000 mg. Get enough vitamin D Vitamin D is the most essential vitamin for bone health. It helps the body absorb calcium. Sunlight stimulates the skin to make vitamin D, so be sure to get enough sunlight. If you live in a cold climate or you do not get outside often, your health care provider may recommend that you take vitamin D supplements. Good sources of vitamin D in your diet include: Egg yolks. Saltwater fish. Milk and cereal fortified with vitamin D. Follow these recommended amounts for daily vitamin D intake: Children and teens, age 1-18: 600 international units. Adults, age 50 or younger: 400-800 international units. Adults, age 51 or older: 800-1,000 international units. Get other important nutrients Other nutrients that are important for bone health include: Phosphorus. This mineral is found in meat, poultry, dairy foods, nuts, and legumes. The recommended daily intake for adult men and adult women is 700 mg. Magnesium. This mineral is found in seeds, nuts, dark green vegetables, and legumes. The recommended daily intake for adult men is 400-420 mg. For adult women, it is 310-320 mg. Vitamin K. This vitamin is found in green leafy vegetables. The recommended daily intake is 120 mg for adult men and 90 mg for adult  women. What type of physical activity is best for building and maintaining healthybones? Weight-bearing and strength-building activities are important for building and maintaining healthy bones. Weight-bearing activities cause muscles and bones to work against gravity. Strength-building activities increase the strength of the muscles that support bones. Weight-bearing and muscle-building activities include: Walking and hiking. Jogging and running. Dancing. Gym exercises. Lifting weights. Tennis and racquetball. Climbing stairs. Aerobics. Adults should get at least 30 minutes of moderate physical activity on most days. Children should get at least 60 minutes of moderate physical activity onmost days. Ask your health care provider what type of exercise is best for you. How can I find out if my bone mass is low? Bone mass can be measured with an X-ray test called a bone mineral density (BMD) test. This test is recommended for all women who are age 65 or older. It may also be recommended for: Men who are age 70 or older. People who   are at risk for osteoporosis because of: Having bones that break easily. Having a long-term disease that weakens bones, such as kidney disease or rheumatoid arthritis. Having menopause earlier than normal. Taking medicine that weakens bones, such as steroids, thyroid hormones, or hormone treatment for breast cancer or prostate cancer. Smoking. Drinking three or more alcoholic drinks a day. If you find that you have a low bone mass, you may be able to preventosteoporosis or further bone loss by changing your diet and lifestyle. Where can I find more information? For more information, check out the following websites: National Osteoporosis Foundation: www.nof.org/patients National Institutes of Health: www.bones.nih.gov International Osteoporosis Foundation: www.iofbonehealth.org Summary The aging process leads to an overall loss of bone mass in the body, which can  increase the likelihood of broken bones and osteoporosis. Eating a well-balanced diet with plenty of calcium and vitamin D will help to protect your bones. Weight-bearing and strength-building activities are also important for building and maintaining strong bones. Bone mass can be measured with an X-ray test called a bone mineral density (BMD) test. This information is not intended to replace advice given to you by your health care provider. Make sure you discuss any questions you have with your healthcare provider. Document Revised: 02/16/2017 Document Reviewed: 02/16/2017 Elsevier Patient Education  2022 Elsevier Inc.  

## 2020-09-20 ENCOUNTER — Ambulatory Visit: Payer: Medicare Other | Admitting: Dermatology

## 2020-09-20 ENCOUNTER — Other Ambulatory Visit: Payer: Self-pay

## 2020-09-20 DIAGNOSIS — D229 Melanocytic nevi, unspecified: Secondary | ICD-10-CM

## 2020-09-20 DIAGNOSIS — L821 Other seborrheic keratosis: Secondary | ICD-10-CM

## 2020-09-20 DIAGNOSIS — L720 Epidermal cyst: Secondary | ICD-10-CM | POA: Diagnosis not present

## 2020-09-20 DIAGNOSIS — D1800 Hemangioma unspecified site: Secondary | ICD-10-CM

## 2020-09-20 DIAGNOSIS — L820 Inflamed seborrheic keratosis: Secondary | ICD-10-CM | POA: Diagnosis not present

## 2020-09-20 DIAGNOSIS — L578 Other skin changes due to chronic exposure to nonionizing radiation: Secondary | ICD-10-CM

## 2020-09-20 DIAGNOSIS — L814 Other melanin hyperpigmentation: Secondary | ICD-10-CM

## 2020-09-20 DIAGNOSIS — Z1283 Encounter for screening for malignant neoplasm of skin: Secondary | ICD-10-CM | POA: Diagnosis not present

## 2020-09-20 DIAGNOSIS — Z86018 Personal history of other benign neoplasm: Secondary | ICD-10-CM

## 2020-09-20 NOTE — Progress Notes (Signed)
New Patient Visit  Subjective  Katrina Huffman is a 64 y.o. 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 She has noticed a lesion on her chest that has been there for years and she would like to discuss having it removed as well as a lesion on her R index ).   The following portions of the chart were reviewed this encounter and updated as appropriate:   Tobacco  Allergies  Meds  Problems  Med Hx  Surg Hx  Fam Hx      Review of Systems:  No other skin or systemic complaints except as noted in HPI or Assessment and Plan.  Objective  Well appearing patient in no apparent distress; mood and affect are within normal limits.  A full examination was performed including scalp, head, eyes, ears, nose, lips, neck, chest, axillae, abdomen, back, buttocks, bilateral upper extremities, bilateral lower extremities, hands, feet, fingers, toes, fingernails, and toenails. All findings within normal limits unless otherwise noted below.  Mid Chest Subcutaneous nodule.   Right 2nd finger Erythematous keratotic or waxy stuck-on papule or plaque.    Assessment & Plan  Epidermal inclusion cyst Mid Chest  Benign-appearing. Exam most consistent with an epidermal inclusion cyst. Discussed that a cyst is a benign growth that can grow over time and sometimes get irritated or inflamed. Recommend observation if it is not bothersome. Discussed option of surgical excision to remove it if it is growing, symptomatic, or other changes noted and advised that this would leave a scar at the site of the current cyst.  She opts to defer removal at this time. Please call for new or changing lesions so they can be evaluated.    Inflamed seborrheic keratosis Right 2nd finger  Prior to procedure, discussed risks of blister formation, small wound, skin dyspigmentation, or  rare scar following cryotherapy. Recommend Vaseline ointment to treated areas while healing.   Destruction of lesion - Right 2nd finger  Destruction method: cryotherapy   Informed consent: discussed and consent obtained   Lesion destroyed using liquid nitrogen: Yes   Cryotherapy cycles:  2 Outcome: patient tolerated procedure well with no complications   Post-procedure details: wound care instructions given    Lentigines - Scattered tan macules - Due to sun exposure - Benign-appering, observe - Recommend daily broad spectrum sunscreen SPF 30+ to sun-exposed areas, reapply every 2 hours as needed. - Call for any changes  Seborrheic Keratoses - Stuck-on, waxy, tan-brown papules and/or plaques  - Benign-appearing - Discussed benign etiology and prognosis. - Observe - Call for any changes  Melanocytic Nevi - Tan-brown and/or pink-flesh-colored symmetric macules and papules - Benign appearing on exam today - Observation - Call clinic for new or changing moles - Recommend daily use of broad spectrum spf 30+ sunscreen to sun-exposed areas.   Hemangiomas - Red papules - Discussed benign nature - Observe - Call for any changes  Actinic Damage - Chronic condition, secondary to cumulative UV/sun exposure - diffuse scaly erythematous macules with underlying dyspigmentation - Recommend daily broad spectrum sunscreen SPF 30+ to sun-exposed areas, reapply every 2 hours as needed.  - Staying in the shade or wearing long sleeves, sun glasses (UVA+UVB protection) and wide brim hats (4-inch brim around the entire circumference of the hat) are also recommended for sun protection.  - Call for new or changing lesions.  Skin cancer screening performed today.  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  - Call  if any new or changing lesions are noted between office visits  Return in about 1 year (around 09/20/2021) for TBSE.  Graciella Belton, RMA, am acting as scribe for Forest Gleason, MD .  Documentation: I have reviewed the above documentation for accuracy and completeness, and I agree with the above.  Forest Gleason, MD

## 2020-09-20 NOTE — Patient Instructions (Addendum)
Cryotherapy Aftercare  Wash gently with soap and water everyday.   Apply Vaseline and Band-Aid daily until healed.   Prior to procedure, discussed risks of blister formation, small wound, skin dyspigmentation, or rare scar following cryotherapy. Recommend Vaseline ointment to treated areas while healing.  Recommend taking Heliocare sun protection supplement daily in sunny weather for additional sun protection. For maximum protection on the sunniest days, you can take up to 2 capsules of regular Heliocare OR take 1 capsule of Heliocare Ultra. For prolonged exposure (such as a full day in the sun), you can repeat your dose of the supplement 4 hours after your first dose. Heliocare can be purchased at [facility] or at [email or web address].    Melanoma ABCDEs  Melanoma is the most dangerous type of skin cancer, and is the leading cause of death from skin disease.  You are more likely to develop melanoma if you: Have light-colored skin, light-colored eyes, or red or blond hair Spend a lot of time in the sun Tan regularly, either outdoors or in a tanning bed Have had blistering sunburns, especially during childhood Have a close family member who has had a melanoma Have atypical moles or large birthmarks  Early detection of melanoma is key since treatment is typically straightforward and cure rates are extremely high if we catch it early.   The first sign of melanoma is often a change in a mole or a new dark spot.  The ABCDE system is a way of remembering the signs of melanoma.  A for asymmetry:  The two halves do not match. B for border:  The edges of the growth are irregular. C for color:  A mixture of colors are present instead of an even brown color. D for diameter:  Melanomas are usually (but not always) greater than 37mm - the size of a pencil eraser. E for evolution:  The spot keeps changing in size, shape, and color.  Please check your skin once per month between visits. You can  use a small mirror in front and a large mirror behind you to keep an eye on the back side or your body.   If you see any new or changing lesions before your next follow-up, please call to schedule a visit.  Please continue daily skin protection including broad spectrum sunscreen SPF 30+ to sun-exposed areas, reapplying every 2 hours as needed when you're outdoors.    If you have any questions or concerns for your doctor, please call our main line at [phone] and press option 4 to reach your doctor's medical assistant. If no one answers, please leave a voicemail as directed and we will return your call as soon as possible. Messages left after 4 pm will be answered the following business day.   You may also send [place] a message via [place]. We typically respond to MyChart messages within 1-2 business days.  For prescription refills, please ask your pharmacy to contact our office. Our fax number is [phone].  If you have an urgent issue when the clinic is closed that cannot wait until the next business day, you can page your doctor at the number below.    Please note that while we do our best to be available for urgent issues outside of office hours, we are not available 24/7.   If you have an urgent issue and are unable to reach [place], you may choose to seek medical care at your doctor's office, retail clinic, urgent care center, or emergency room.  If you have a medical emergency, please immediately call 911 or go to the emergency department.  Pager Numbers  - Dr. Nehemiah Massed: [phone]  - Dr. Laurence Ferrari: [phone]  - Dr. Nicole Kindred: [phone]  In the event of inclement weather, please call our main line at [phone] for an update on the status of any delays or closures.  Dermatology Medication Tips: Please keep the boxes that topical medications come in in order to help keep track of the instructions about where and how to use these. Pharmacies typically print the medication  instructions only on the boxes and not directly on the medication tubes.   If your medication is too expensive, please contact our office at [phone] option 4 or send [place] a message through [place].   We are unable to tell what your co-pay for medications will be in advance as this is different depending on your insurance coverage. However, we may be able to find a substitute medication at lower cost or fill out paperwork to get insurance to cover a needed medication.   If a prior authorization is required to get your medication covered by your insurance company, please allow [place] 1-2 business days to complete this process.  Drug prices often vary depending on where the prescription is filled and some pharmacies may offer cheaper prices.  The website www.goodrx.com contains coupons for medications through different pharmacies. The prices here do not account for what the cost may be with help from insurance (it may be cheaper with your insurance), but the website can give you the price if you did not use any insurance.  - You can print the associated coupon and take it with your prescription to the pharmacy.  - You may also stop by our office during regular business hours and pick up a GoodRx coupon card.  - If you need your prescription sent electronically to a different pharmacy, notify our office through [facility] or by phone at [phone] option 4.

## 2020-09-28 ENCOUNTER — Other Ambulatory Visit: Payer: Self-pay

## 2020-09-28 ENCOUNTER — Ambulatory Visit (INDEPENDENT_AMBULATORY_CARE_PROVIDER_SITE_OTHER): Payer: Medicare Other | Admitting: Obstetrics and Gynecology

## 2020-09-28 ENCOUNTER — Other Ambulatory Visit (HOSPITAL_COMMUNITY)
Admission: RE | Admit: 2020-09-28 | Discharge: 2020-09-28 | Disposition: A | Discharge status: Home / Self Care | Payer: Medicare Other | Source: Ambulatory Visit | Attending: Obstetrics and Gynecology | Admitting: Obstetrics and Gynecology

## 2020-09-28 ENCOUNTER — Encounter: Payer: Self-pay | Admitting: Obstetrics and Gynecology

## 2020-09-28 VITALS — BP 150/90 | Ht 65.0 in | Wt 245.0 lb

## 2020-09-28 DIAGNOSIS — N8500 Endometrial hyperplasia, unspecified: Secondary | ICD-10-CM

## 2020-09-28 DIAGNOSIS — Z8742 Personal history of other diseases of the female genital tract: Secondary | ICD-10-CM | POA: Insufficient documentation

## 2020-09-28 DIAGNOSIS — Z30430 Encounter for insertion of intrauterine contraceptive device: Secondary | ICD-10-CM | POA: Diagnosis not present

## 2020-09-28 NOTE — Progress Notes (Signed)
Patient ID: Katrina Huffman, female   DOB: 07-14-1955, 65 y.o.   MRN: [id number]  Reason for Consult: Mirena Replacement and Endometrial Biopsy   Referred by Abner Greenspan, MD  Subjective:     HPI:  Katrina Huffman is a 65 y.o. female. She has a history of endometrial hyperplasia. She has had an IUD in place for 5 years.  Plan for today is endometrial biopsy and exchange of the IUD.  Gynecological History  No LMP recorded. Patient is postmenopausal.    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   History of 2019 novel coronavirus disease (COVID-19) 02/28/2019   positive results in care everywhere;  12-15-2019  per pt mild symptoms resolved in just over 2 wks with no residual   History of hypertension    per pt hx dx HTN prior to gastric bypass 12/ 2018,  since lost wt no issue   OSA (obstructive sleep apnea)    12-15-2019 per pt has not used cpap since 01/ 2021 stated does not need   PONV (postoperative nausea and vomiting)    PONV after knee arthroplasty   Right ureteral stone    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   Scoliosis    sees chiropracter monthly   Wears glasses    Family History  Problem Relation Age of Onset   Arthritis Father    Cancer Father        prostate CA   Hypertension Father    Diabetes Brother    Arthritis Mother    Hypertension Mother    Cancer Paternal Aunt        breast cancer   Arthritis Maternal Grandmother    Hypertension Maternal Grandmother    Arthritis Maternal Grandfather    Hypertension Maternal Grandfather    Diabetes Maternal Grandfather    Past Surgical History:  Procedure Laterality Date   BREAST SURGERY  1999   breast biopsy   BUNIONECTOMY  2000   CESAREAN SECTION   1983; 1985   COLONOSCOPY WITH PROPOFOL N/A 04/30/2015   Procedure: COLONOSCOPY WITH PROPOFOL;  Surgeon: [name], MD;  Location: [place];  Service: Endoscopy;  Laterality: N/A;   COLONOSCOPY WITH PROPOFOL N/A 05/03/2019   Procedure: COLONOSCOPY WITH PROPOFOL;  Surgeon: [name], MD;  Location[facility] ENDOSCOPY;  Service: Endoscopy;  Laterality: N/A;  patient was COVID POSITIVE on 02/26/2019   CYSTOSCOPY WITH RETROGRADE PYELOGRAM, URETEROSCOPY AND STENT PLACEMENT Right 12/22/2019   Procedure: CYSTOSCOPY WITH RIGHT RETROGRADE PYELOGRAM, URETEROSCOPY WITH HOLMIUM LASER , BASKET EXTRACTION OF STONES AND STENT PLACEMENT;  Surgeon: Ardis Hughs, MD;  Location: [facility];  Service: Urology;  Laterality: Right;   DIAGNOSTIC LAPAROSCOPY  01/19/2017   LOA w/ Gastropexy/ EGD   EXTRACORPOREAL SHOCK WAVE LITHOTRIPSY Right 08/22/2019   Procedure: RIGHT EXTRACORPOREAL SHOCK WAVE LITHOTRIPSY (ESWL);  Surgeon: Ceasar Mons, MD;  Location: [facility];  Service: Urology;  Laterality: Right;   HYSTEROSCOPY WITH D & C  x2  last one 01/ 2017   KNEE ARTHROSCOPY Left 05/09/2015   Procedure: ARTHROSCOPY LEFT KNEE WITH SYNOVECTOMY;  Surgeon: [name], MD;  Location: [facility];  Service: Orthopedics;  Laterality: Left;   KNEE ARTHROSCOPY Left 04/27/2017   Procedure: Left knee arthroscopy; synovectomy;  Surgeon: [name], MD;  Location: [facility];  Service: Orthopedics;  Laterality: Left;   KNEE ARTHROSCOPY Left 2009   LAPAROSCOPIC GASTRIC SLEEVE RESECTION  01/14/2017   POLYPECTOMY  04/30/2015   Procedure: POLYPECTOMY;  Surgeon: [name], MD;  Location: [place];  Service: Endoscopy;;   TONSILLECTOMY AND ADENOIDECTOMY  2007   TOTAL KNEE ARTHROPLASTY Right 04/27/2017   Procedure: RIGHT TOTAL KNEE ARTHROPLASTY;  Surgeon: [name], MD;  Location: [facility];  Service: Orthopedics;  Laterality: Right;   TOTAL KNEE ARTHROPLASTY Left 2014    TOTAL KNEE ARTHROPLASTY WITH REVISION COMPONENTS Left 04/16/2016   Procedure: LEFT TOTAL KNEE ARTHROPLASTY WITH POLY REVISION;  Surgeon: [name], MD;  Location: [facility];  Service: Orthopedics;  Laterality: Left;  with abductor block   TOTAL SHOULDER ARTHROPLASTY Right 10/16/2016   TOTAL SHOULDER ARTHROPLASTY Right 10/16/2016   Procedure: TOTAL SHOULDER ARTHROPLASTY;  Surgeon: [name], MD;  Location: [place];  Service: Orthopedics;  Laterality: Right;   [place] AND STRIPPING  2000    Short Social History:  Social History   Tobacco Use   Smoking status: Never   Smokeless tobacco: Never  Substance Use Topics   Alcohol use: Not Currently    Comment: rare    No Known Allergies  Current Outpatient Medications  Medication Sig Dispense Refill   ALPRAZolam (XANAX) 0.5 MG tablet Take 1 tablet (0.5 mg total) by mouth daily as needed for anxiety. 30 tablet 5   Calcium 500-100 MG-UNIT CHEW Chew by mouth.     Cholecalciferol (VITAMIN D) 50 MCG (2000 UT) tablet Take 4,000 Units by mouth daily.     citalopram (CELEXA) 20 MG tablet TAKE 1 TABLET BY MOUTH  DAILY 90 tablet 3   Cranberry 400 MG CAPS Take by mouth daily.      gabapentin (NEURONTIN) 300 MG capsule TAKE 3 CAPSULES BY MOUTH 3  TIMES DAILY 810 capsule 1   meloxicam (MOBIC) 15 MG tablet Take 1 tablet (15 mg total) by mouth daily as needed for pain. With food 90 tablet 3   Multiple Vitamins-Minerals (BARIATRIC MULTIVITAMINS/IRON) CAPS Take by mouth daily.     phenazopyridine (PYRIDIUM) 200 MG tablet Take 1 tablet (200 mg total) by mouth 3 (three) times daily as needed for pain. (Patient not taking: No sig reported) 10 tablet 0   No current facility-administered medications for this visit.    Review of Systems  Constitutional: Negative for chills, fatigue, fever and unexpected weight change.  HENT: Negative for trouble swallowing.  Eyes: Negative for loss of vision.  Respiratory: Negative for  cough, shortness of breath and wheezing.  Cardiovascular: Negative for chest pain, leg swelling, palpitations and syncope.  GI: Negative for abdominal pain, blood in stool, diarrhea, nausea and vomiting.  GU: Negative for difficulty urinating, dysuria, frequency and hematuria.  Musculoskeletal: Negative for back pain, leg pain and joint pain.  Skin: Negative for rash.  Neurological: Negative for dizziness, headaches, light-headedness, numbness and seizures.  Psychiatric: Negative for behavioral problem, confusion, depressed mood and sleep disturbance.       Objective:  Objective   Vitals:   09/28/20 1459  BP: (!) 150/90  Weight: 245 lb (111.1 kg)  Height: '5\' 5"'$  (1.651 m)   Body mass index is 40.77 kg/m.  Physical Exam Vitals and nursing note reviewed. Exam conducted with a chaperone present.  Constitutional:      Appearance: Normal appearance. She is well-developed.  HENT:     Head: Normocephalic and atraumatic.  Eyes:     Extraocular Movements: Extraocular movements intact.     Pupils: Pupils are equal, round, and reactive to light.  Cardiovascular:     Rate and Rhythm: Normal rate and regular rhythm.  Pulmonary:     Effort: Pulmonary effort is normal. No respiratory distress.     Breath sounds: Normal breath sounds.  Abdominal:     General: Abdomen is flat.     Palpations: Abdomen is soft.  Genitourinary:    Comments: External: Normal appearing vulva. No lesions noted.  Speculum examination: Normal appearing cervix. No blood in the vaginal vault. Musculoskeletal:        General: No signs of injury.  Skin:    General: Skin is warm and dry.  Neurological:     Mental Status: She is alert and oriented to person, place, and time.  Psychiatric:        Behavior: Behavior normal.        Thought Content: Thought content normal.        Judgment: Judgment normal.     IUD placed approximately 5 years ago. Since that time, she states that she has been happy with the IUD  and not had postmenopausal bleeding.Marland Kitchen She wishes to have her IUD removed and replaced with a new  IUD.   Discussed risks of irregular bleeding, cramping, infection, malpositioning or misplacement of the IUD outside the uterus which may require further procedure such as laparoscopy, risk of failure <1%. Time out was performed.   Patient identified, informed consent performed, consent signed.     A bimanual exam showed the uterus to be anteverted.  Speculum placed in the vagina.  Cervix visualized.    IUD Removal Strings of IUD identified and grasped.  IUD removed without problem.  Pt tolerated this well.  IUD noted to be intact.  Endometrial Biopsy After discussion with the patient regarding her history of endometrial hyperplasia. I recommended that she proceed with an endometrial biopsy for further diagnosis. The risks, benefits, alternatives, and indications for an endometrial biopsy were discussed with the patient in detail. She understood the risks including infection, bleeding, cervical laceration and uterine perforation.  Verbal consent was obtained.   PROCEDURE NOTE:  [place] endometrial biopsy was performed using aseptic technique with iodine preparation.  The uterus was sounded to a length of 8 cm.  Adequate sampling was obtained with minimal blood loss.  The patient tolerated the procedure well.  Disposition will be pending pathology.   IUD Placement Cleaned with Betadine x 2.  Grasped anteriorly with a single tooth tenaculum.  Uterus sounded to 8 cm.   Mirena IUD placed per manufacturer's recommendations.  Strings trimmed to 3 cm. Tenaculum was removed, good hemostasis noted.  Patient tolerated procedure well.   Patient was given post-procedure instructions. Patient was also asked to check IUD strings periodically and follow up in 4 weeks for IUD check.   Assessment/Plan:     65 year old with history of endometrial hyperplasia IUD exchange performed today.  Surveillance  endometrial biopsy performed. To follow-up for IUD string check in 4 weeks  More than 20 minutes were spent face to face with the patient in the room, reviewing the medical record, labs and images, and coordinating care for the patient. The plan of management was discussed in detail and counseling was provided.      Adrian Prows MD  [place] 09/28/2020 3:11 PM

## 2020-09-28 NOTE — Patient Instructions (Signed)

## 2020-09-29 ENCOUNTER — Encounter: Payer: Self-pay | Admitting: Dermatology

## 2020-10-02 LAB — SURGICAL PATHOLOGY

## 2020-10-03 NOTE — Telephone Encounter (Signed)
Mirena rcvd/charge 09/28/20

## 2020-10-29 ENCOUNTER — Ambulatory Visit: Payer: Medicare Other | Admitting: Obstetrics and Gynecology

## 2020-10-29 ENCOUNTER — Other Ambulatory Visit: Payer: Self-pay

## 2020-10-29 ENCOUNTER — Encounter: Payer: Self-pay | Admitting: Obstetrics and Gynecology

## 2020-10-29 VITALS — BP 130/70 | Ht 65.0 in | Wt 247.6 lb

## 2020-10-29 DIAGNOSIS — Z0000 Encounter for general adult medical examination without abnormal findings: Secondary | ICD-10-CM

## 2020-10-29 DIAGNOSIS — Z23 Encounter for immunization: Secondary | ICD-10-CM

## 2020-10-29 DIAGNOSIS — Z30431 Encounter for routine checking of intrauterine contraceptive device: Secondary | ICD-10-CM | POA: Diagnosis not present

## 2020-10-29 MED ORDER — MELOXICAM 15 MG PO TABS: 15.0000 mg | ORAL_TABLET | Freq: Every day | ORAL | 3 refills | Status: DC | PRN

## 2020-10-29 NOTE — Progress Notes (Signed)
History of Present Illness:  Katrina Huffman is a 65 y.o. that had a Mirena IUD placed approximately 2 month ago. Since that time, she states that she has had little pain  PMHx: She  has a past medical history of Anxiety, Arthritis, Depression, Facial paresthesia, History of 2019 novel coronavirus disease (COVID-19) (02/28/2019), History of hypertension, OSA (obstructive sleep apnea), PONV (postoperative nausea and vomiting), Right ureteral stone, S/P gastric sleeve procedure (01/14/2017), Scoliosis, and Wears glasses. Also,  has a past surgical history that includes Cesarean section (1983; 1985); Tubal ligation ([place]); Vein ligation and stripping (2000); Bunionectomy (2000); Breast surgery (1999); Colonoscopy with propofol (N/A, 04/30/2015); polypectomy (04/30/2015); Knee arthroscopy (Left, 05/09/2015); Total knee arthroplasty with revision components (Left, 04/16/2016); Total shoulder arthroplasty (Right, 10/16/2016); Total shoulder arthroplasty (Right, 10/16/2016); Total knee arthroplasty (Right, 04/27/2017); Knee arthroscopy (Left, 04/27/2017); Colonoscopy with propofol (N/A, 05/03/2019); Extracorporeal shock wave lithotripsy (Right, 08/22/2019); Tonsillectomy and adenoidectomy (2007); Hysteroscopy with D & C (x2  last one 01/ 2017); Knee arthroscopy (Left, 2009); Total knee arthroplasty (Left, 2014); Laparoscopic gastric sleeve resection (01/14/2017); Diagnostic laparoscopy (01/19/2017); and Cystoscopy with retrograde pyelogram, ureteroscopy and stent placement (Right, 12/22/2019)., family history includes Arthritis in her father, maternal grandfather, maternal grandmother, and mother; Cancer in her father and paternal aunt; Diabetes in her brother and maternal grandfather; Hypertension in her father, maternal grandfather, maternal grandmother, and mother.,  reports that she has never smoked. She has never used smokeless tobacco. She reports that she does not currently use alcohol. She reports that she does  not use drugs. No outpatient medications have been marked as taking for the 10/29/20 encounter (Office Visit) with Homero Fellers, MD.  .  Also, has No Known Allergies..  Review of Systems  Constitutional:  Negative for chills, fever, malaise/fatigue and weight loss.  HENT:  Negative for congestion, hearing loss and sinus pain.   Eyes:  Negative for blurred vision and double vision.  Respiratory:  Negative for cough, sputum production, shortness of breath and wheezing.   Cardiovascular:  Negative for chest pain, palpitations, orthopnea and leg swelling.  Gastrointestinal:  Negative for abdominal pain, constipation, diarrhea, nausea and vomiting.  Genitourinary:  Negative for dysuria, flank pain, frequency, hematuria and urgency.  Musculoskeletal:  Negative for back pain, falls and joint pain.  Skin:  Negative for itching and rash.  Neurological:  Negative for dizziness and headaches.  Psychiatric/Behavioral:  Negative for depression, substance abuse and suicidal ideas. The patient is not nervous/anxious.    Physical Exam:  There were no vitals taken for this visit. There is no height or weight on file to calculate BMI. Constitutional: Well nourished, well developed female in no acute distress.  Abdomen: diffusely non tender to palpation, non distended, and no masses, hernias Neuro: Grossly intact Psych:  Normal mood and affect.    Pelvic exam:  Two IUD strings present seen coming from the cervical os. EGBUS, vaginal vault and cervix: within normal limits  Assessment: IUD strings present in proper location; pt doing well  Plan: She was told to continue to use barrier contraception, in order to prevent any STIs, and to take a home pregnancy test or call us if she ever thinks she may be pregnant, and that her IUD expires in 5 years.  She was amenable to this plan and we will see her back in 1 year/PRN.  A total of 15 minutes were spent face-to-face with the patient as well as  preparation, review, communication, and documentation during this encounter.  Adrian Prows MD, [name], [facility] 10/29/2020 3:30 PM

## 2020-12-07 DIAGNOSIS — G4733 Obstructive sleep apnea (adult) (pediatric): Secondary | ICD-10-CM | POA: Diagnosis not present

## 2021-01-17 ENCOUNTER — Other Ambulatory Visit: Payer: Self-pay | Admitting: Family Medicine

## 2021-01-17 DIAGNOSIS — F32A Depression, unspecified: Secondary | ICD-10-CM

## 2021-02-22 ENCOUNTER — Other Ambulatory Visit: Payer: Self-pay | Admitting: Neurology

## 2021-02-22 ENCOUNTER — Other Ambulatory Visit: Payer: Self-pay | Admitting: Obstetrics and Gynecology

## 2021-02-22 DIAGNOSIS — Z0000 Encounter for general adult medical examination without abnormal findings: Secondary | ICD-10-CM

## 2021-02-25 ENCOUNTER — Ambulatory Visit: Payer: Medicare Other | Admitting: Neurology

## 2021-02-25 ENCOUNTER — Other Ambulatory Visit: Payer: Self-pay

## 2021-02-25 ENCOUNTER — Ambulatory Visit (INDEPENDENT_AMBULATORY_CARE_PROVIDER_SITE_OTHER): Payer: Medicare Other | Admitting: Family

## 2021-02-25 ENCOUNTER — Encounter: Payer: Self-pay | Admitting: Family

## 2021-02-25 VITALS — BP 144/76 | HR 88 | Temp 97.4°F | Ht 65.0 in | Wt 247.0 lb

## 2021-02-25 DIAGNOSIS — J019 Acute sinusitis, unspecified: Secondary | ICD-10-CM | POA: Insufficient documentation

## 2021-02-25 DIAGNOSIS — H938X2 Other specified disorders of left ear: Secondary | ICD-10-CM | POA: Insufficient documentation

## 2021-02-25 MED ORDER — AMOXICILLIN-POT CLAVULANATE 875-125 MG PO TABS: 1.0000 | ORAL_TABLET | Freq: Two times a day (BID) | ORAL | 0 refills | Status: DC

## 2021-02-25 MED ORDER — LORATADINE 10 MG PO TABS: 10.0000 mg | ORAL_TABLET | Freq: Every day | ORAL | 0 refills | Status: DC

## 2021-02-25 NOTE — Assessment & Plan Note (Signed)
Referred to ent as present prior to sinustitis. Also have suggested claritin to see if helps with middle ear effusion

## 2021-02-25 NOTE — Patient Instructions (Addendum)
Prescription given for augmentin 875/125 mg po bid for ten days. Pt to continue tylenol/ibuprofen prn sinus pain. Continue with humidifier prn and steam showers recommended as well. instructed If no symptom improvement in 48 hours please f/u  I recommend you pick up over the counter Claritin (aka loratadine). Generic is same thing but cheaper!   A referral was placed today for ENT for ongoing left ear sensation. Please let us know if you have not heard back within 1 week about your referral.  It was a pleasure seeing you today! Please do not hesitate to reach out with any questions and or concerns.  Regards,   Eugenia Pancoast FNP-C

## 2021-02-25 NOTE — Progress Notes (Signed)
Established Patient Office Visit  Subjective:  Patient ID: Katrina [facility]man, female    DOB: 22-Jan-1956  Age: 66 y.o. MRN: [id number]  CC:  Chief Complaint  Patient presents with   Nasal Congestion    HPI [name] is here today with concerns.   Left ear fullness/feels like cotton in the ear. Does state often wet inside of both her ears, but this is chronic. Also c/o nasal congestion, sinus pressure/pain, post nasal drip, started with sore throat (which has improved but still there), chest congestion and cough is more dry than wet. No wheezing or sob.   Sx have been the past three weeks.   Taking otc mucinex with some mild relief.   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   History of 2019 novel coronavirus disease (COVID-19) 02/28/2019   positive results in care everywhere;  12-15-2019  per pt mild symptoms resolved in just over 2 wks with no residual   History of hypertension    per pt hx dx HTN prior to gastric bypass 12/ 2018,  since lost wt no issue   OSA (obstructive sleep apnea)    12-15-2019 per pt has not used cpap since 01/ 2021 stated does not need   PONV (postoperative nausea and vomiting)    PONV after knee arthroplasty   Right ureteral stone    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   Scoliosis    sees chiropracter monthly   Wears glasses     Past Surgical History:  Procedure Laterality Date   BREAST SURGERY  1999   breast biopsy   BUNIONECTOMY  2000   [place]; 1985   COLONOSCOPY WITH PROPOFOL N/A 04/30/2015   Procedure: COLONOSCOPY WITH PROPOFOL;  Surgeon: [name], MD;  Location: [facility];  Service: Endoscopy;  Laterality: N/A;   COLONOSCOPY WITH PROPOFOL N/A 05/03/2019    Procedure: COLONOSCOPY WITH PROPOFOL;  Surgeon: [name], MD;  Location: [facility];  Service: Endoscopy;  Laterality: N/A;  patient was COVID POSITIVE on 02/26/2019   CYSTOSCOPY WITH RETROGRADE PYELOGRAM, URETEROSCOPY AND STENT PLACEMENT Right 12/22/2019   Procedure: CYSTOSCOPY WITH RIGHT RETROGRADE PYELOGRAM, URETEROSCOPY WITH HOLMIUM LASER , BASKET EXTRACTION OF STONES AND STENT PLACEMENT;  Surgeon: Ardis Hughs, MD;  Location: [facility];  Service: Urology;  Laterality: Right;   DIAGNOSTIC LAPAROSCOPY  01/19/2017   LOA w/ Gastropexy/ EGD   EXTRACORPOREAL SHOCK WAVE LITHOTRIPSY Right 08/22/2019   Procedure: RIGHT EXTRACORPOREAL SHOCK WAVE LITHOTRIPSY (ESWL);  Surgeon: Ceasar Mons, MD;  Location: [facility];  Service: Urology;  Laterality: Right;   HYSTEROSCOPY WITH D & C  x2  last one 01/ 2017   KNEE ARTHROSCOPY Left 05/09/2015   Procedure: ARTHROSCOPY LEFT KNEE WITH SYNOVECTOMY;  Surgeon: [name], MD;  Location: [facility];  Service: Orthopedics;  Laterality: Left;   KNEE ARTHROSCOPY Left 04/27/2017   Procedure: Left knee arthroscopy; synovectomy;  Surgeon: [name], MD;  Location: [facility];  Service: Orthopedics;  Laterality: Left;   KNEE ARTHROSCOPY Left 2009   LAPAROSCOPIC GASTRIC SLEEVE RESECTION  01/14/2017   POLYPECTOMY  04/30/2015   Procedure: POLYPECTOMY;  Surgeon: [name], MD;  Location: [place];  Service: Endoscopy;;   TONSILLECTOMY AND ADENOIDECTOMY  2007   TOTAL KNEE ARTHROPLASTY Right 04/27/2017   Procedure: RIGHT TOTAL KNEE ARTHROPLASTY;  Surgeon: [name], MD;  Location: [facility];  Service: Orthopedics;  Laterality: Right;   TOTAL KNEE ARTHROPLASTY Left 2014   TOTAL KNEE ARTHROPLASTY WITH REVISION COMPONENTS Left 04/16/2016   Procedure: LEFT TOTAL KNEE ARTHROPLASTY WITH POLY REVISION;  Surgeon: [name], MD;  Location: [facility];  Service: Orthopedics;  Laterality: Left;  with abductor block    TOTAL SHOULDER ARTHROPLASTY Right 10/16/2016   TOTAL SHOULDER ARTHROPLASTY Right 10/16/2016   Procedure: TOTAL SHOULDER ARTHROPLASTY;  Surgeon: [name], MD;  Location: [place];  Service: Orthopedics;  Laterality: Right;   TUBAL LIGATION  1986 & 1991   VEIN LIGATION AND STRIPPING  2000    Family History  Problem Relation Age of Onset   Arthritis Father    Cancer Father        prostate CA   Hypertension Father    Diabetes Brother    Arthritis Mother    Hypertension Mother    Cancer Paternal Aunt        breast cancer   Arthritis Maternal Grandmother    Hypertension Maternal Grandmother    Arthritis Maternal Grandfather    Hypertension Maternal Grandfather    Diabetes Maternal Grandfather     Social History   Socioeconomic History   Marital status: Divorced    Spouse name: Not on file   Number of children: Not on file   Years of education: Not on file   Highest education level: Not on file  Occupational History   Not on file  Tobacco Use   Smoking status: Never   Smokeless tobacco: Never  Vaping Use   Vaping Use: Never used  Substance and Sexual Activity   Alcohol use: Not Currently    Comment: rare   Drug use: Never   Sexual activity: Not on file  Other Topics Concern   Not on file  Social History Narrative   Right handed   Drinks caffeine   One story home   Social Determinants of Health   Financial Resource Strain: Not on file  Food Insecurity: Not on file  Transportation Needs: Not on file  Physical Activity: Not on file  Stress: Not on file  Social Connections: Not on file  Intimate Partner Violence: Not on file    Outpatient Medications Prior to Visit  Medication Sig Dispense Refill   ALPRAZolam (XANAX) 0.5 MG tablet Take 1 tablet (0.5 mg total) by mouth daily as needed for anxiety. 30 tablet 5   Calcium 500-100 MG-UNIT CHEW Chew by mouth.     Cholecalciferol (VITAMIN D) 50 MCG (2000 UT) tablet Take 4,000 Units by mouth daily.     citalopram  (CELEXA) 20 MG tablet TAKE 1 TABLET BY MOUTH  DAILY 90 tablet 1   Cranberry 400 MG CAPS Take by mouth daily.      gabapentin (NEURONTIN) 300 MG capsule TAKE 3 CAPSULES BY MOUTH 3  TIMES DAILY 270 capsule 0   meloxicam (MOBIC) 15 MG tablet TAKE 1 TABLET BY MOUTH  DAILY WITH FOOD AS NEEDED  FOR PAIN 90 tablet 3   Multiple Vitamins-Minerals (BARIATRIC MULTIVITAMINS/IRON) CAPS Take by mouth daily.     phenazopyridine (PYRIDIUM) 200 MG tablet Take 1 tablet (200 mg total) by mouth 3 (three) times daily as needed for pain. 10 tablet 0   No facility-administered medications prior to visit.  No Known Allergies  ROS Review of Systems  Constitutional:  Negative for chills and fever.  HENT:  Positive for ear pain (left), postnasal drip, sinus pressure, sinus pain and sore throat. Negative for congestion.   Respiratory:  Positive for cough and wheezing (more nasal). Negative for shortness of breath.   Cardiovascular:  Negative for chest pain and palpitations.     Objective:    Physical Exam Vitals reviewed.  Constitutional:      General: She is not in acute distress.    Appearance: She is obese. She is not ill-appearing, toxic-appearing or diaphoretic.  HENT:     Head: Normocephalic.     Right Ear: Hearing normal. A middle ear effusion (clear) is present. Tympanic membrane is not erythematous or bulging.     Left Ear: Hearing normal. A middle ear effusion is present. Impacted cerumen: clear. Tympanic membrane is not erythematous or bulging.     Nose: Nose normal.     Mouth/Throat:     Mouth: Mucous membranes are moist.     Tonsils: No tonsillar abscesses.     Comments: Left upper ulceration left pharynx   Eyes:     Pupils: Pupils are equal, round, and reactive to light.  Cardiovascular:     Rate and Rhythm: Normal rate and regular rhythm.  Pulmonary:     Effort: Pulmonary effort is normal.     Breath sounds: Normal breath sounds.  Neurological:     General: No focal deficit present.      Mental Status: She is alert and oriented to person, place, and time.  Psychiatric:        Mood and Affect: Mood normal.        Behavior: Behavior normal.        Thought Content: Thought content normal.        Judgment: Judgment normal.    BP (!) 144/76    Pulse 88    Temp (!) 97.4 F (36.3 C) (Temporal)    Ht 5\' 5"  (1.651 m)    Wt 247 lb (112 kg)    SpO2 98%    BMI 41.10 kg/m  Wt Readings from Last 3 Encounters:  02/25/21 247 lb (112 kg)  10/29/20 247 lb 9.6 oz (112.3 kg)  09/28/20 245 lb (111.1 kg)     Health Maintenance Due  Topic Date Due   Zoster Vaccines- Shingrix (2 of 2) 08/06/2016   COVID-19 Vaccine (4 - Booster for Pfizer series) 03/15/2020   MAMMOGRAM  12/07/2020    There are no preventive care reminders to display for this patient.  Lab Results  Component Value Date   TSH 2.44 05/07/2020   Lab Results  Component Value Date   WBC 7.0 05/07/2020   HGB 13.3 05/07/2020   HCT 40.0 05/07/2020   MCV 87.9 05/07/2020   PLT 228.0 05/07/2020   Lab Results  Component Value Date   NA 142 05/07/2020   K 4.2 05/07/2020   CO2 33 (H) 05/07/2020   GLUCOSE 81 05/07/2020   BUN 20 05/07/2020   CREATININE 0.77 05/07/2020   BILITOT 0.4 05/07/2020   ALKPHOS 98 05/07/2020   AST 17 05/07/2020   ALT 13 05/07/2020   PROT 6.4 05/07/2020   ALBUMIN 4.2 05/07/2020   CALCIUM 9.2 05/07/2020   ANIONGAP 8 04/29/2017   GFR 80.47 05/07/2020   No results found for: HGBA1C    Assessment & Plan:   Problem List Items Addressed This Visit  Respiratory   Acute sinusitis with symptoms > 10 days - Primary    Prescription given for augmentin 875/125 mg po bid for ten days. Pt to continue tylenol/ibuprofen prn sinus pain. Continue with humidifier prn and steam showers recommended as well. instructed If no symptom improvement in 48 hours please f/u Also recommended otc claritin        Relevant Medications   amoxicillin-clavulanate (AUGMENTIN) 875-125 MG tablet    loratadine (CLARITIN) 10 MG tablet     Nervous and Auditory   Abnormal ear sensation, left    Referred to ent as present prior to sinustitis. Also have suggested claritin to see if helps with middle ear effusion      Relevant Orders   Ambulatory referral to ENT    Meds ordered this encounter  Medications   amoxicillin-clavulanate (AUGMENTIN) 875-125 MG tablet    Sig: Take 1 tablet by mouth 2 (two) times daily.    Dispense:  20 tablet    Refill:  0    Order Specific Question:   Supervising Provider    Answer:   BEDSOLE, AMY E [[id number]]   loratadine (CLARITIN) 10 MG tablet    Sig: Take 1 tablet (10 mg total) by mouth daily.    Dispense:  90 tablet    Refill:  0    Order Specific Question:   Supervising Provider    Answer:   BEDSOLE, AMY E [[id number]]    Follow-up: Return if symptoms worsen or fail to improve.    Eugenia Pancoast, FNP

## 2021-02-25 NOTE — Assessment & Plan Note (Signed)
Prescription given for augmentin 875/125 mg po bid for ten days. Pt to continue tylenol/ibuprofen prn sinus pain. Continue with humidifier prn and steam showers recommended as well. instructed If no symptom improvement in 48 hours please f/u Also recommended otc claritin

## 2021-03-04 ENCOUNTER — Other Ambulatory Visit: Payer: Self-pay

## 2021-03-04 ENCOUNTER — Ambulatory Visit
Admission: RE | Admit: 2021-03-04 | Discharge: 2021-03-04 | Disposition: A | Discharge status: Home / Self Care | Payer: Medicare Other | Source: Ambulatory Visit | Attending: Obstetrics and Gynecology | Admitting: Obstetrics and Gynecology

## 2021-03-04 DIAGNOSIS — Z1231 Encounter for screening mammogram for malignant neoplasm of breast: Secondary | ICD-10-CM | POA: Diagnosis not present

## 2021-03-05 ENCOUNTER — Encounter: Payer: Self-pay | Admitting: Family

## 2021-03-05 NOTE — Telephone Encounter (Signed)
Yes she can call them to schedule.   Beverly Gust, MD - [facility] Address: [address] [place], [place] [address] Phone: [phone]

## 2021-04-01 NOTE — Progress Notes (Signed)
Virtual Visit via Video Note The purpose of this virtual visit is to provide medical care while limiting exposure to the novel coronavirus.    Consent was obtained for video visit:  Yes.   Answered questions that patient had about telehealth interaction:  Yes.   I discussed the limitations, risks, security and privacy concerns of performing an evaluation and management service by telemedicine. I also discussed with the patient that there may be a patient responsible charge related to this service. The patient expressed understanding and agreed to proceed.  Pt location: [facility] Location: office Name of referring provider:  Tower, [name], MD I connected with Katrina Huffman at patients initiation/request on 04/02/2021 at  1:10 PM EST by video enabled telemedicine application and verified that I am speaking with the correct person using two identifiers. Pt MRN:  [id number] Pt DOB:  30-Dec-1955 Video Participants:  Hughes and Plan:     Atypical left sided facial pain  - overall controlled.  The occasional tooth sensitivity may be related but thankfully nothing frequent or too severe.   Gabapentin 900mg  three times daily - refilled Follow up 6 months.   Subjective:  Katrina Huffman is a 66 year old right-handed female who follows up for left-sided atypical facial pain.   UPDATE: Taking gabapentin 900mg  three times daily.   Neuralgia controlled. Followed up with dentist.  Nothing wrong with her teeth.  The tooth sensitivity occurs once in awhile. For the past 3 months or so, she has had aural fullness in her left ear.  Going to see ENT  HISTORY: Symptoms started in late August 2021.  Sitting at computer and suddenly felt a tingling sensation on the left posterior region on the crown.  It feels like "a thousand ants crawling on her head".  She says it feels like a cold sensation that feels burning.  Each episode lasts around 5 minutes several times daily.   Sometimes it only involves the left side of face, the ear, neck.  No electric pain.  No headache, visual disturbance or involvement of extremities.  Sed rate was 31.  MRI of brain with and without contrast on 12/27/2019 personally reviewed and revealed minimal punctate nonenhancing T2/FLAIR foci in the cerebral white matter but no acute intracranial abnormality.  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   History of 2019 novel coronavirus disease (COVID-19) 02/28/2019   positive results in care everywhere;  12-15-2019  per pt mild symptoms resolved in just over 2 wks with no residual   History of hypertension    per pt hx dx HTN prior to gastric bypass 12/ 2018,  since lost wt no issue   OSA (obstructive sleep apnea)    12-15-2019 per pt has not used cpap since 01/ 2021 stated does not need   PONV (postoperative nausea and vomiting)    PONV after knee arthroplasty   Right ureteral stone    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   amoxicillin-clavulanate (AUGMENTIN) 875-125 MG tablet Take 1 tablet by mouth 2 (two) times daily.   Calcium 500-100 MG-UNIT CHEW Chew by mouth.   Cholecalciferol (VITAMIN D) 50 MCG (2000 UT) tablet Take 4,000 Units by mouth daily.   citalopram (CELEXA) 20 MG tablet TAKE 1 TABLET BY MOUTH  DAILY   Cranberry 400 MG CAPS Take by mouth daily.    loratadine (CLARITIN) 10 MG tablet Take 1 tablet (10 mg total) by mouth daily.   meloxicam (MOBIC) 15 MG tablet  TAKE 1 TABLET BY MOUTH  DAILY WITH FOOD AS NEEDED  FOR PAIN   Multiple Vitamins-Minerals (BARIATRIC MULTIVITAMINS/IRON) CAPS Take by mouth daily.   [DISCONTINUED] gabapentin (NEURONTIN) 300 MG capsule TAKE 3 CAPSULES BY MOUTH 3  TIMES DAILY   gabapentin (NEURONTIN) 300 MG capsule TAKE 3 CAPSULES BY MOUTH 3  TIMES DAILY   No facility-administered encounter medications on file as of 04/02/2021.    Allergies: No Known Allergies  Family History: Family History  Problem Relation Age of Onset   Arthritis Mother    Hypertension Mother    Arthritis Father    Cancer Father        prostate CA   Hypertension Father    Cancer Paternal Aunt        breast cancer   Arthritis Maternal Grandmother    Hypertension Maternal Grandmother    Arthritis Maternal Grandfather    Hypertension Maternal Grandfather    Diabetes Maternal Grandfather    Diabetes Brother    Breast cancer Neg Hx     Observations/Objective:   No acute distress.  Alert and oriented.  Speech fluent and not dysarthric.  Language intact.    Follow Up Instructions:    -I discussed the assessment and treatment plan with the patient. The patient was provided an opportunity to ask questions and all were answered. The patient agreed with the plan and demonstrated an understanding of the instructions.   The patient was advised to call back or seek an in-person evaluation if the symptoms worsen or if the condition fails to improve as anticipated.  Dudley Major, DO

## 2021-04-02 ENCOUNTER — Encounter: Payer: Self-pay | Admitting: Neurology

## 2021-04-02 ENCOUNTER — Other Ambulatory Visit: Payer: Self-pay

## 2021-04-02 ENCOUNTER — Telehealth: Payer: Medicare Other | Admitting: Neurology

## 2021-04-02 VITALS — Ht 64.0 in | Wt 240.0 lb

## 2021-04-02 DIAGNOSIS — G501 Atypical facial pain: Secondary | ICD-10-CM | POA: Diagnosis not present

## 2021-04-02 MED ORDER — GABAPENTIN 300 MG PO CAPS: ORAL_CAPSULE | ORAL | 1 refills | Status: AC

## 2021-04-15 DIAGNOSIS — H903 Sensorineural hearing loss, bilateral: Secondary | ICD-10-CM | POA: Diagnosis not present

## 2021-04-15 DIAGNOSIS — H6982 Other specified disorders of Eustachian tube, left ear: Secondary | ICD-10-CM | POA: Diagnosis not present

## 2021-05-27 DIAGNOSIS — H6982 Other specified disorders of Eustachian tube, left ear: Secondary | ICD-10-CM | POA: Diagnosis not present

## 2021-05-27 DIAGNOSIS — H6122 Impacted cerumen, left ear: Secondary | ICD-10-CM | POA: Diagnosis not present

## 2021-05-28 ENCOUNTER — Other Ambulatory Visit: Payer: Self-pay | Admitting: Neurology

## 2021-06-10 ENCOUNTER — Ambulatory Visit (INDEPENDENT_AMBULATORY_CARE_PROVIDER_SITE_OTHER): Payer: Medicare Other

## 2021-06-10 VITALS — Ht 65.0 in | Wt 240.6 lb

## 2021-06-10 DIAGNOSIS — M419 Scoliosis, unspecified: Secondary | ICD-10-CM | POA: Insufficient documentation

## 2021-06-10 DIAGNOSIS — R7303 Prediabetes: Secondary | ICD-10-CM | POA: Insufficient documentation

## 2021-06-10 DIAGNOSIS — G4733 Obstructive sleep apnea (adult) (pediatric): Secondary | ICD-10-CM | POA: Insufficient documentation

## 2021-06-10 DIAGNOSIS — Z0000 Encounter for general adult medical examination without abnormal findings: Secondary | ICD-10-CM

## 2021-06-10 NOTE — Progress Notes (Signed)
? ?Subjective:  ? Katrina Huffman is a 66 y.o. female who presents for Medicare Annual (Subsequent) preventive examination. ?Virtual Visit via Telephone Note ? ?I connected with  Katrina Huffman on 06/10/21 at  3:30 PM EDT by telephone and verified that I am speaking with the correct person using two identifiers. ? ?Location: ?Patient: HOME ?Provider: [facility] ?Persons participating in the virtual visit: patient/Nurse Health Advisor ?  ?I discussed the limitations, risks, security and privacy concerns of performing an evaluation and management service by telephone and the availability of in person appointments. The patient expressed understanding and agreed to proceed. ? ?Interactive audio and video telecommunications were attempted between this nurse and patient, however failed, due to patient having technical difficulties OR patient did not have access to video capability.  We continued and completed visit with audio only. ? ?Some vital signs may be absent or patient reported.  ? ?Chriss Driver, LPN ? ?Review of Systems    ? ?Cardiac Risk Factors include: advanced age (>29mn, >>73women);hypertension;sedentary lifestyle;obesity (BMI >30kg/m2) ? ?   ?Objective:  ?  ?Today's Vitals  ? 06/10/21 1530  ?Weight: 240 lb 9.6 oz (109.1 kg)  ?Height: '5\' 5"'$  (1.651 m)  ? ?Body mass index is 40.04 kg/m?. ? ? ?  06/10/2021  ?  3:43 PM 04/02/2021  ? 12:50 PM 02/07/2020  ?  1:42 PM 12/22/2019  ?  6:01 AM 08/22/2019  ?  6:44 AM 05/03/2019  ?  9:11 AM 04/27/2017  ?  5:00 PM  ?Advanced Directives  ?Does Patient Have a Medical Advance Directive? Yes Yes No No No No No  ?[place]       ?Would patient like information on creating a medical advance directive? No - Patient declined   Yes (MAU/Ambulatory/Procedural Areas - Information given) No - Patient declined  No - Patient declined  ? ? ?Current Medications (verified) ?Outpatient Encounter Medications as of 06/10/2021   ?Medication Sig  ? ALPRAZolam (XANAX) 0.5 MG tablet Take 1 tablet (0.5 mg total) by mouth daily as needed for anxiety.  ? amoxicillin-clavulanate (AUGMENTIN) 875-125 MG tablet Take 1 tablet by mouth 2 (two) times daily.  ? Calcium 500-100 MG-UNIT CHEW Chew by mouth.  ? Cholecalciferol (VITAMIN D) 50 MCG (2000 UT) tablet Take 4,000 Units by mouth daily.  ? citalopram (CELEXA) 20 MG tablet TAKE 1 TABLET BY MOUTH  DAILY  ? Cranberry 400 MG CAPS Take by mouth daily.   ? gabapentin (NEURONTIN) 300 MG capsule TAKE 3 CAPSULES BY MOUTH 3  TIMES DAILY  ? meloxicam (MOBIC) 15 MG tablet TAKE 1 TABLET BY MOUTH  DAILY WITH FOOD AS NEEDED  FOR PAIN  ? Multiple Vitamins-Minerals (BARIATRIC MULTIVITAMINS/IRON) CAPS Take by mouth daily.  ? loratadine (CLARITIN) 10 MG tablet Take 1 tablet (10 mg total) by mouth daily.  ? ?No facility-administered encounter medications on file as of 06/10/2021.  ? ? ?Allergies (verified) ?Patient has no known allergies.  ? ?History: ?Past Medical History:  ?Diagnosis Date  ? Anxiety   ? Arthritis   ? hands, knees, back  ? Depression   ? Facial paresthesia   ? 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 History of 2019 novel coronavirus disease (COVID-19) 02/28/2019  ? positive results in care everywhere;  12-15-2019  per  pt mild symptoms resolved in just over 2 wks with no residual  ? History of hypertension   ? per pt hx dx HTN prior to gastric bypass 12/ 2018,  since lost wt no issue  ? OSA (obstructive sleep apnea)   ? 12-15-2019 per pt has not used cpap since 01/ 2021 stated does not need  ? PONV (postoperative nausea and vomiting)   ? PONV after knee arthroplasty  ? Right ureteral stone   ? 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 Scoliosis   ? sees chiropracter monthly  ? Wears glasses   ? ?Past Surgical History:  ?Procedure Laterality Date  ? BREAST SURGERY  1999  ?  breast biopsy  ? BUNIONECTOMY  2000  ? [place]; 1985  ? COLONOSCOPY WITH PROPOFOL N/A 04/30/2015  ? Procedure: COLONOSCOPY WITH PROPOFOL;  Surgeon: [name], MD;  Location: [place];  Service: Endoscopy;  Laterality: N/A;  ? COLONOSCOPY WITH PROPOFOL N/A 05/03/2019  ? Procedure: COLONOSCOPY WITH PROPOFOL;  Surgeon: [name], MD;  Location: [facility] ENDOSCOPY;  Service: Endoscopy;  Laterality: N/A;  patient was COVID POSITIVE on 02/26/2019  ? CYSTOSCOPY WITH RETROGRADE PYELOGRAM, URETEROSCOPY AND STENT PLACEMENT Right 12/22/2019  ? Procedure: CYSTOSCOPY WITH RIGHT RETROGRADE PYELOGRAM, URETEROSCOPY WITH HOLMIUM LASER , BASKET EXTRACTION OF STONES AND STENT PLACEMENT;  Surgeon: Ardis Hughs, MD;  Location: [facility];  Service: Urology;  Laterality: Right;  ? DIAGNOSTIC LAPAROSCOPY  01/19/2017  ? LOA w/ Gastropexy/ EGD  ? EXTRACORPOREAL SHOCK WAVE LITHOTRIPSY Right 08/22/2019  ? Procedure: RIGHT EXTRACORPOREAL SHOCK WAVE LITHOTRIPSY (ESWL);  Surgeon: Ceasar Mons, MD;  [facility];  Service: Urology;  Laterality: Right;  ? HYSTEROSCOPY WITH D & C  x2  last one 01/ 2017  ? KNEE ARTHROSCOPY Left 05/09/2015  ? Procedure: ARTHROSCOPY LEFT KNEE WITH SYNOVECTOMY;  Surgeon: [name], MD;  Location: [facility];  Service: Orthopedics;  Laterality: Left;  ? KNEE ARTHROSCOPY Left 04/27/2017  ? Procedure: Left knee arthroscopy; synovectomy;  Surgeon: [name], MD;  Location: [facility];  Service: Orthopedics;  Laterality: Left;  ? KNEE ARTHROSCOPY Left 2009  ? LAPAROSCOPIC GASTRIC SLEEVE RESECTION  01/14/2017  ? POLYPECTOMY  04/30/2015  ? Procedure: POLYPECTOMY;  Surgeon: [name], MD;  Location: [place];  Service: Endoscopy;;  ? TONSILLECTOMY AND ADENOIDECTOMY  2007  ? TOTAL KNEE ARTHROPLASTY Right 04/27/2017  ? Procedure: RIGHT TOTAL KNEE ARTHROPLASTY;  Surgeon: [name], MD;  Location: [facility];  Service: Orthopedics;   Laterality: Right;  ? TOTAL KNEE ARTHROPLASTY Left 2014  ? TOTAL KNEE ARTHROPLASTY WITH REVISION COMPONENTS Left 04/16/2016  ? Procedure: LEFT TOTAL KNEE ARTHROPLASTY WITH POLY REVISION;  Surgeon: [name], MD;  Location: [facility];  Service: Orthopedics;  Laterality: Left;  with abductor block  ? TOTAL SHOULDER ARTHROPLASTY Right 10/16/2016  ? TOTAL SHOULDER ARTHROPLASTY Right 10/16/2016  ? Procedure: TOTAL SHOULDER ARTHROPLASTY;  Surgeon: [name], MD;  Location: [place];  Service: Orthopedics;  Laterality: Right;  ? [place]  ? VEIN LIGATION AND STRIPPING  2000  ? ?Family History  ?Problem Relation Age of Onset  ? Arthritis Mother   ? Hypertension Mother   ? Arthritis Father   ? Cancer Father   ?     prostate CA  ? Hypertension Father   ? Cancer Paternal Aunt   ?     breast cancer  ? Arthritis Maternal Grandmother   ?  Hypertension Maternal Grandmother   ? Arthritis Maternal Grandfather   ? Hypertension Maternal Grandfather   ? Diabetes Maternal Grandfather   ? Diabetes Brother   ? Breast cancer Neg Hx   ? ?Social History  ? ?Socioeconomic History  ? Marital status: Divorced  ?  Spouse name: Not on file  ? Number of children: Not on file  ? Years of education: Not on file  ? Highest education level: Not on file  ?Occupational History  ? Not on file  ?Tobacco Use  ? Smoking status: Never  ? Smokeless tobacco: Never  ?Vaping Use  ? Vaping Use: Never used  ?Substance and Sexual Activity  ? Alcohol use: Not Currently  ?  Comment: rare  ? Drug use: Never  ? Sexual activity: Not on file  ?Other Topics Concern  ? Not on file  ?Social History Narrative  ? Right handed  ? Drinks caffeine  ? One story home  ? ?Social Determinants of Health  ? ?Financial Resource Strain: Low Risk   ? Difficulty of Paying Living Expenses: Not very hard  ?Food Insecurity: No Food Insecurity  ? Worried About Charity fundraiser in the Last Year: Never true  ? Ran Out of Food in the Last Year: Never true  ?Transportation  Needs: No Transportation Needs  ? Lack of Transportation (Medical): No  ? Lack of Transportation (Non-Medical): No  ?Physical Activity: Insufficiently Active  ? Days of Exercise per Week: 5 days  ? Minutes of Exer

## 2021-06-10 NOTE — Patient Instructions (Signed)
Katrina Huffman , ?Thank you for taking time to come for your Medicare Wellness Visit. I appreciate your ongoing commitment to your health goals. Please review the following plan we discussed and let me know if I can assist you in the future.  ? ?Screening recommendations/referrals: ?Colonoscopy: Done 05/03/2019 Repeat in 5 years ? ?Mammogram: Done 03/04/2021 Repeat annually ? ?Bone Density: Discussed. Done 09/02/2017 Repeat every 2 years ? ?Recommended yearly ophthalmology/optometry visit for glaucoma screening and checkup ?Recommended yearly dental visit for hygiene and checkup ? ?Vaccinations: ?Influenza vaccine: Done 10/29/2020 Repeat annually ? ?Pneumococcal vaccine: Done 04/25/2021 and 05/07/2020 ?Tdap vaccine: Done 03/06/2017 Repeat in 10 years ? ?Shingles vaccine: 04/11/2016 and 06/11/2016   ?Covid-19:Done 01/19/2020, 07/05/2019 and 06/10/2019. ? ?Advanced directives: Please bring a copy of your health care power of attorney and living will to the office to be added to your chart at your convenience. ? ? ?Conditions/risks identified: Aim for 30 minutes of exercise or brisk walking, 6-8 glasses of water, and 5 servings of fruits and vegetables each day. ? ? ?Next appointment: Follow up in one year for your annual wellness visit 2024 ? ? ?Preventive Care 68 Years and Older, Female ?Preventive care refers to lifestyle choices and visits with your health care provider that can promote health and wellness. ?What does preventive care include? ?A yearly physical exam. This is also called an annual well check. ?Dental exams once or twice a year. ?Routine eye exams. Ask your health care provider how often you should have your eyes checked. ?Personal lifestyle choices, including: ?Daily care of your teeth and gums. ?Regular physical activity. ?Eating a healthy diet. ?Avoiding tobacco and drug use. ?Limiting alcohol use. ?Practicing safe sex. ?Taking low-dose aspirin every day. ?Taking vitamin and mineral supplements as recommended by your  health care provider. ?What happens during an annual well check? ?The services and screenings done by your health care provider during your annual well check will depend on your age, overall health, lifestyle risk factors, and family history of disease. ?Counseling  ?Your health care provider may ask you questions about your: ?Alcohol use. ?Tobacco use. ?Drug use. ?Emotional well-being. ?Home and relationship well-being. ?Sexual activity. ?Eating habits. ?History of falls. ?Memory and ability to understand (cognition). ?Work and work Statistician. ?Reproductive health. ?Screening  ?You may have the following tests or measurements: ?Height, weight, and BMI. ?Blood pressure. ?Lipid and cholesterol levels. These may be checked every 5 years, or more frequently if you are over 58 years old. ?Skin check. ?Lung cancer screening. You may have this screening every year starting at age 20 if you have a 30-pack-year history of smoking and currently smoke or have quit within the past 15 years. ?Fecal occult blood test (FOBT) of the stool. You may have this test every year starting at age 33. ?Flexible sigmoidoscopy or colonoscopy. You may have a sigmoidoscopy every 5 years or a colonoscopy every 10 years starting at age 24. ?Hepatitis C blood test. ?Hepatitis B blood test. ?Sexually transmitted disease (STD) testing. ?Diabetes screening. This is done by checking your blood sugar (glucose) after you have not eaten for a while (fasting). You may have this done every 1-3 years. ?Bone density scan. This is done to screen for osteoporosis. You may have this done starting at age 69. ?Mammogram. This may be done every 1-2 years. Talk to your health care provider about how often you should have regular mammograms. ?Talk with your health care provider about your test results, treatment options, and if necessary, the  need for more tests. ?Vaccines  ?Your health care provider may recommend certain vaccines, such as: ?Influenza vaccine.  This is recommended every year. ?Tetanus, diphtheria, and acellular pertussis (Tdap, Td) vaccine. You may need a Td booster every 10 years. ?Zoster vaccine. You may need this after age 60. ?Pneumococcal 13-valent conjugate (PCV13) vaccine. One dose is recommended after age 73. ?Pneumococcal polysaccharide (PPSV23) vaccine. One dose is recommended after age 29. ?Talk to your health care provider about which screenings and vaccines you need and how often you need them. ?This information is not intended to replace advice given to you by your health care provider. Make sure you discuss any questions you have with your health care provider. ?Document Released: 02/16/2015 Document Revised: 10/10/2015 Document Reviewed: 11/21/2014 ?Elsevier Interactive Patient Education ? 2017 [place]. ? ?Fall Prevention in the Home ?Falls can cause injuries. They can happen to people of all ages. There are many things you can do to make your home safe and to help prevent falls. ?What can I do on the outside of my home? ?Regularly fix the edges of walkways and driveways and fix any cracks. ?Remove anything that might make you trip as you walk through a door, such as a raised step or threshold. ?Trim any bushes or trees on the path to your home. ?Use bright outdoor lighting. ?Clear any walking paths of anything that might make someone trip, such as rocks or tools. ?Regularly check to see if handrails are loose or broken. Make sure that both sides of any steps have handrails. ?Any raised decks and porches should have guardrails on the edges. ?Have any leaves, snow, or ice cleared regularly. ?Use sand or salt on walking paths during winter. ?Clean up any spills in your garage right away. This includes oil or grease spills. ?What can I do in the bathroom? ?Use night lights. ?Install grab bars by the toilet and in the tub and shower. Do not use towel bars as grab bars. ?Use non-skid mats or decals in the tub or shower. ?If you need to sit  down in the shower, use a plastic, non-slip stool. ?Keep the floor dry. Clean up any water that spills on the floor as soon as it happens. ?Remove soap buildup in the tub or shower regularly. ?Attach bath mats securely with double-sided non-slip rug tape. ?Do not have throw rugs and other things on the floor that can make you trip. ?What can I do in the bedroom? ?Use night lights. ?Make sure that you have a light by your bed that is easy to reach. ?Do not use any sheets or blankets that are too big for your bed. They should not hang down onto the floor. ?Have a firm chair that has side arms. You can use this for support while you get dressed. ?Do not have throw rugs and other things on the floor that can make you trip. ?What can I do in the kitchen? ?Clean up any spills right away. ?Avoid walking on wet floors. ?Keep items that you use a lot in easy-to-reach places. ?If you need to reach something above you, use a strong step stool that has a grab bar. ?Keep electrical cords out of the way. ?Do not use floor polish or wax that makes floors slippery. If you must use wax, use non-skid floor wax. ?Do not have throw rugs and other things on the floor that can make you trip. ?What can I do with my stairs? ?Do not leave any items on the  stairs. ?Make sure that there are handrails on both sides of the stairs and use them. Fix handrails that are broken or loose. Make sure that handrails are as long as the stairways. ?Check any carpeting to make sure that it is firmly attached to the stairs. Fix any carpet that is loose or worn. ?Avoid having throw rugs at the top or bottom of the stairs. If you do have throw rugs, attach them to the floor with carpet tape. ?Make sure that you have a light switch at the top of the stairs and the bottom of the stairs. If you do not have them, ask someone to add them for you. ?What else can I do to help prevent falls? ?Wear shoes that: ?Do not have high heels. ?Have rubber bottoms. ?Are  comfortable and fit you well. ?Are closed at the toe. Do not wear sandals. ?If you use a stepladder: ?Make sure that it is fully opened. Do not climb a closed stepladder. ?Make sure that both sides of the st

## 2021-07-01 ENCOUNTER — Other Ambulatory Visit: Payer: Self-pay | Admitting: Family Medicine

## 2021-07-01 DIAGNOSIS — F32A Anxiety disorder, unspecified: Secondary | ICD-10-CM

## 2021-08-12 ENCOUNTER — Other Ambulatory Visit: Payer: Self-pay | Admitting: Neurology

## 2021-08-20 DIAGNOSIS — H2513 Age-related nuclear cataract, bilateral: Secondary | ICD-10-CM | POA: Diagnosis not present

## 2021-08-28 ENCOUNTER — Other Ambulatory Visit (INDEPENDENT_AMBULATORY_CARE_PROVIDER_SITE_OTHER): Payer: Medicare Other

## 2021-08-28 ENCOUNTER — Telehealth (INDEPENDENT_AMBULATORY_CARE_PROVIDER_SITE_OTHER): Payer: Medicare Other | Admitting: Family Medicine

## 2021-08-28 DIAGNOSIS — R7303 Prediabetes: Secondary | ICD-10-CM

## 2021-08-28 DIAGNOSIS — E559 Vitamin D deficiency, unspecified: Secondary | ICD-10-CM | POA: Diagnosis not present

## 2021-08-28 DIAGNOSIS — I10 Essential (primary) hypertension: Secondary | ICD-10-CM | POA: Diagnosis not present

## 2021-08-28 LAB — COMPREHENSIVE METABOLIC PANEL
ALT: 11 U/L (ref 0–35)
AST: 14 U/L (ref 0–37)
Albumin: 4.1 g/dL (ref 3.5–5.2)
Alkaline Phosphatase: 83 U/L (ref 39–117)
BUN: 22 mg/dL (ref 6–23)
CO2: 33 mEq/L — ABNORMAL HIGH (ref 19–32)
Calcium: 9.1 mg/dL (ref 8.4–10.5)
Chloride: 103 mEq/L (ref 96–112)
Creatinine, Ser: 0.78 mg/dL (ref 0.40–1.20)
GFR: 78.51 mL/min (ref >60.00)
Glucose, Bld: 97 mg/dL (ref 70–99)
Potassium: 4.8 mEq/L (ref 3.5–5.1)
Sodium: 141 mEq/L (ref 135–145)
Total Bilirubin: 0.5 mg/dL (ref 0.2–1.2)
Total Protein: 6.3 g/dL (ref 6.0–8.3)

## 2021-08-28 LAB — LIPID PANEL
Cholesterol: 181 mg/dL (ref 0–200)
HDL: 53.8 mg/dL (ref >39.00)
LDL Cholesterol: 108 mg/dL — ABNORMAL HIGH (ref 0–99)
NonHDL: 127.02
Total CHOL/HDL Ratio: 3
Triglycerides: 96 mg/dL (ref 0.0–149.0)
VLDL: 19.2 mg/dL (ref 0.0–40.0)

## 2021-08-28 LAB — CBC WITH DIFFERENTIAL/PLATELET
Basophils Absolute: 0.1 10*3/uL (ref 0.0–0.1)
Basophils Relative: 1.5 % (ref 0.0–3.0)
Eosinophils Absolute: 0.2 10*3/uL (ref 0.0–0.7)
Eosinophils Relative: 4.1 % (ref 0.0–5.0)
HCT: 38.9 % (ref 36.0–46.0)
Hemoglobin: 12.8 g/dL (ref 12.0–15.0)
Lymphocytes Relative: 32.2 % (ref 12.0–46.0)
Lymphs Abs: 1.5 10*3/uL (ref 0.7–4.0)
MCHC: 32.9 g/dL (ref 30.0–36.0)
MCV: 89.3 fl (ref 78.0–100.0)
Monocytes Absolute: 0.5 10*3/uL (ref 0.1–1.0)
Monocytes Relative: 10.5 % (ref 3.0–12.0)
Neutro Abs: 2.4 10*3/uL (ref 1.4–7.7)
Neutrophils Relative %: 51.7 % (ref 43.0–77.0)
Platelets: 232 10*3/uL (ref 150.0–400.0)
RBC: 4.36 Mil/uL (ref 3.87–5.11)
RDW: 13.9 % (ref 11.5–15.5)
WBC: 4.7 10*3/uL (ref 4.0–10.5)

## 2021-08-28 LAB — HEMOGLOBIN A1C: Hgb A1c MFr Bld: 5.5 % (ref 4.6–6.5)

## 2021-08-28 LAB — VITAMIN D 25 HYDROXY (VIT D DEFICIENCY, FRACTURES): VITD: 42.26 ng/mL (ref 30.00–100.00)

## 2021-08-28 LAB — TSH: TSH: 3.42 u[IU]/mL (ref 0.35–5.50)

## 2021-08-28 NOTE — Telephone Encounter (Signed)
Lab order

## 2021-09-04 ENCOUNTER — Ambulatory Visit (INDEPENDENT_AMBULATORY_CARE_PROVIDER_SITE_OTHER): Payer: Medicare Other | Admitting: Family Medicine

## 2021-09-04 ENCOUNTER — Encounter: Payer: Medicare Other | Admitting: Family Medicine

## 2021-09-04 ENCOUNTER — Encounter: Payer: Self-pay | Admitting: Family Medicine

## 2021-09-04 VITALS — BP 126/82 | HR 65 | Ht 64.5 in | Wt 241.2 lb

## 2021-09-04 DIAGNOSIS — Z9884 Bariatric surgery status: Secondary | ICD-10-CM

## 2021-09-04 DIAGNOSIS — R7303 Prediabetes: Secondary | ICD-10-CM | POA: Diagnosis not present

## 2021-09-04 DIAGNOSIS — I10 Essential (primary) hypertension: Secondary | ICD-10-CM | POA: Diagnosis not present

## 2021-09-04 DIAGNOSIS — N8500 Endometrial hyperplasia, unspecified: Secondary | ICD-10-CM | POA: Diagnosis not present

## 2021-09-04 DIAGNOSIS — E559 Vitamin D deficiency, unspecified: Secondary | ICD-10-CM | POA: Diagnosis not present

## 2021-09-04 DIAGNOSIS — F419 Anxiety disorder, unspecified: Secondary | ICD-10-CM

## 2021-09-04 DIAGNOSIS — F32A Depression, unspecified: Secondary | ICD-10-CM

## 2021-09-04 DIAGNOSIS — Z0000 Encounter for general adult medical examination without abnormal findings: Secondary | ICD-10-CM | POA: Diagnosis not present

## 2021-09-04 MED ORDER — CITALOPRAM HYDROBROMIDE 20 MG PO TABS: 20.0000 mg | ORAL_TABLET | Freq: Every day | ORAL | 3 refills | Status: DC

## 2021-09-04 MED ORDER — MELOXICAM 15 MG PO TABS: 15.0000 mg | ORAL_TABLET | Freq: Every day | ORAL | 0 refills | Status: DC | PRN

## 2021-09-04 NOTE — Assessment & Plan Note (Signed)
Reviewed health habits including diet and exercise and skin cancer prevention Reviewed appropriate screening tests for age  Also reviewed health mt list, fam hx and immunization status , as well as social and family history   See HPI Labs reviewed  Has amw from [facility] at home yearly  Sent for 2nd shingrix imm date  Mammogram and colonoscopy utd Due for annual gyn f/u (iud and endo hyperplasia)  Pap utd 2021 dexa utd/normal bmd , enc her to start back on vitamin D

## 2021-09-04 NOTE — Assessment & Plan Note (Signed)
Discussed how this problem influences overall health and the risks it imposes  Reviewed plan for weight loss with lower calorie diet (via better food choices and also portion control or program like weight watchers) and exercise building up to or more than 30 minutes 5 days per week including some aerobic activity   S/p gastric sleeve procedure in the past  Unfortunately gained  Strongly recommend low glycemic diet Also consider regular exercise program

## 2021-09-04 NOTE — Progress Notes (Signed)
Subjective:    Patient ID: Katrina Huffman, female    DOB: 1955-08-17, 66 y.o.   MRN: [id number]  HPI Here for health maintenance exam and to review chronic medical problems    Wt Readings from Last 3 Encounters:  09/04/21 241 lb 3.2 oz (109.4 kg)  06/10/21 240 lb 9.6 oz (109.1 kg)  04/02/21 240 lb (108.9 kg)   40.76 kg/m  [employer] does her amw in October at her house    Loves her job  Working and very busy  Still some time to care for herself   Bariatric surgery in the past   Immunization History  Administered Date(s) Administered   Fluad Quad(high Dose 65+) 12/06/2019   Influenza,inj,Quad PF,6+ Mos 03/05/2016, 11/27/2016, 12/08/2017, 10/29/2020   Influenza-Unspecified 11/29/2016, 12/04/2017   PFIZER(Purple Top)SARS-COV-2 Vaccination 06/10/2019, 07/05/2019, 01/19/2020   Pneumococcal Conjugate-13 04/26/2019   Pneumococcal Polysaccharide-23 05/07/2020   Tdap 03/06/2017   Zoster Recombinat (Shingrix) 06/11/2016   Health Maintenance Due  Topic Date Due   Zoster Vaccines- Shingrix (2 of 2) 08/06/2016   COVID-19 Vaccine (4 - Booster for Pfizer series) 03/15/2020   INFLUENZA VACCINE  09/03/2021   Had shingrix in 2018 - unsure of 2nd date- sending for 2nd date   Mammogram 02/2021 Self breast exam : no lumps    She sees dermatology-appt upcoming  Has a cyst on chest wall    Colonoscopy 04/2019 5 y recall   Nl pap 08/2019   Dexa  08/2017 at [facility]- normal range  Falls:none Fractures:none  Supplements  4000 iu D -ran out of it/needs to pick some up  Bariatric mvi  Vit D level is 42.2 Exercise : lot of physical work / at home /active      09/04/2021    2:45 PM 06/10/2021    3:36 PM 05/07/2020    3:02 PM 08/31/2019   10:35 AM 04/26/2019   10:20 AM  Depression screen PHQ 2/9  Decreased Interest 0 0 0 0 0  Down, Depressed, Hopeless 0 0 0 0 0  PHQ - 2 Score 0 0 0 0 0  Altered sleeping    0   Tired, decreased energy    0   Change in appetite    0   Feeling bad or failure  about yourself     0   Trouble concentrating    0   Moving slowly or fidgety/restless    0   Suicidal thoughts    0   PHQ-9 Score    0    Takes celexa 20 mg daily  It controls anxiety very well overall  Tried to get off of it years ago when stress improved / was not able to get off of it/anxiety and tearfulness got worse   Occ gets triggered situationally  Takes at most 4 xanax per year - for emergencies   Had a recent hearing test and it went well (at ENT office)  Had some ? Fluid in ear  Did not recommend any nasal spray   Gyn care Has IUD for endometrial hyperplasia   [place])  bp is stable today  No cp or palpitations or headaches or edema  No side effects to medicines  BP Readings from Last 3 Encounters:  09/04/21 126/82  02/25/21 (!) 144/76  10/29/20 130/70     No current medication for this   Lab Results  Component Value Date   CREATININE 0.78 08/28/2021  BUN 22 08/28/2021   NA 141 08/28/2021   K 4.8 08/28/2021   CL 103 08/28/2021   CO2 33 (H) 08/28/2021    Prediabetes Lab Results  Component Value Date   HGBA1C 5.5 08/28/2021   Sugar intake is fair  She snacks too much in general  Does not drink sugar drinks     Cholesterol Lab Results  Component Value Date   CHOL 181 08/28/2021   CHOL 199 05/07/2020   CHOL 197 04/26/2019   Lab Results  Component Value Date   HDL 53.80 08/28/2021   HDL 61.50 05/07/2020   HDL 61.70 04/26/2019   Lab Results  Component Value Date   LDLCALC 108 (H) 08/28/2021   LDLCALC 116 (H) 05/07/2020   LDLCALC 114 (H) 04/26/2019   Lab Results  Component Value Date   TRIG 96.0 08/28/2021   TRIG 106.0 05/07/2020   TRIG 103.0 04/26/2019   Lab Results  Component Value Date   CHOLHDL 3 08/28/2021   CHOLHDL 3 05/07/2020   CHOLHDL 3 04/26/2019   No results found for: "LDLDIRECT"  The 10-year ASCVD risk score (Arnett DK, et al., 2019) is: 6.5%   Values used to calculate the  score:     Age: 56 years     Sex: Female     Is Non-Hispanic African American: No     Diabetic: No     Tobacco smoker: No     Systolic Blood Pressure: 892 mmHg     Is BP treated: No     HDL Cholesterol: 53.8 mg/dL     Total Cholesterol: 181 mg/dL   Lab Results  Component Value Date   WBC 4.7 08/28/2021   HGB 12.8 08/28/2021   HCT 38.9 08/28/2021   MCV 89.3 08/28/2021   PLT 232.0 08/28/2021   Lab Results  Component Value Date   TSH 3.42 08/28/2021    Takes meloxicam daily  No peptic ulcers with this   Patient Active Problem List   Diagnosis Date Noted   OSA on CPAP 06/10/2021   Prediabetes 06/10/2021   Scoliosis 06/10/2021   Facial paresthesia 12/06/2019   Personal history of colonic polyps    Endometrial hyperplasia 04/26/2019   Welcome to Medicare preventive visit 04/26/2019   OA (osteoarthritis) of knee 04/27/2017   Bariatric surgery status 03/06/2017   S/P shoulder replacement 10/16/2016   Esophagitis 10/02/2016   Failed total knee arthroplasty, 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   Failed total knee arthroplasty ([place]) 04/16/2016   Bilateral chronic knee pain 03/20/2016   Vitamin D deficiency 03/05/2016   Anxiety disorder 03/05/2016   Essential hypertension 03/05/2016   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   Special screening for malignant neoplasms, colon    Benign neoplasm of transverse colon    Postmenopausal bleeding 12/16/2014   Screening for lipoid disorders 06/18/2010   Routine general medical examination at a health care facility 06/18/2010   Morbid obesity ([place]) 06/18/2010   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   History of 2019 novel coronavirus disease (COVID-19) 02/28/2019    positive results in care everywhere;  12-15-2019  per pt mild symptoms resolved in just over 2 wks with no residual  History of hypertension    per pt hx dx HTN prior to gastric bypass 12/ 2018,  since lost wt no issue   OSA (obstructive sleep apnea)    12-15-2019 per pt has not used cpap since 01/ 2021 stated does not need   PONV (postoperative nausea and vomiting)    PONV after knee arthroplasty   Right ureteral stone    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   Scoliosis    sees chiropracter monthly   Wears glasses    Past Surgical History:  Procedure Laterality Date   BREAST SURGERY  1999   breast biopsy   BUNIONECTOMY  2000   [place]; 1985   COLONOSCOPY WITH PROPOFOL N/A 04/30/2015   Procedure: COLONOSCOPY WITH PROPOFOL;  Surgeon: [name], MD;  Location: [facility];  Service: Endoscopy;  Laterality: N/A;   COLONOSCOPY WITH PROPOFOL N/A 05/03/2019   Procedure: COLONOSCOPY WITH PROPOFOL;  Surgeon: [name], MD;  Location: [facility] ENDOSCOPY;  Service: Endoscopy;  Laterality: N/A;  patient was COVID POSITIVE on 02/26/2019   CYSTOSCOPY WITH RETROGRADE PYELOGRAM, URETEROSCOPY AND STENT PLACEMENT Right 12/22/2019   Procedure: CYSTOSCOPY WITH RIGHT RETROGRADE PYELOGRAM, URETEROSCOPY WITH HOLMIUM LASER , BASKET EXTRACTION OF STONES AND STENT PLACEMENT;  Surgeon: Ardis Hughs, MD;  Location[facility];  Service: Urology;  Laterality: Right;   DIAGNOSTIC LAPAROSCOPY  01/19/2017   LOA w/ Gastropexy/ EGD   EXTRACORPOREAL SHOCK WAVE LITHOTRIPSY Right 08/22/2019   Procedure: RIGHT EXTRACORPOREAL SHOCK WAVE LITHOTRIPSY (ESWL);  Surgeon: Ceasar Mons, MD;  Location: [facility];  Service: Urology;  Laterality: Right;   HYSTEROSCOPY WITH D & C  x2  last one 01/ 2017   KNEE ARTHROSCOPY Left 05/09/2015   Procedure: ARTHROSCOPY LEFT KNEE WITH SYNOVECTOMY;  Surgeon: [name], MD;  Location: [facility];  Service: Orthopedics;   Laterality: Left;   KNEE ARTHROSCOPY Left 04/27/2017   Procedure: Left knee arthroscopy; synovectomy;  Surgeon: [name], MD;  Location: [facility];  Service: Orthopedics;  Laterality: Left;   KNEE ARTHROSCOPY Left 2009   LAPAROSCOPIC GASTRIC SLEEVE RESECTION  01/14/2017   POLYPECTOMY  04/30/2015   Procedure: POLYPECTOMY;  Surgeon: [name], MD;  Location: [place];  Service: Endoscopy;;   TONSILLECTOMY AND ADENOIDECTOMY  2007   TOTAL KNEE ARTHROPLASTY Right 04/27/2017   Procedure: RIGHT TOTAL KNEE ARTHROPLASTY;  Surgeon: [name], MD;  Location: [facility];  Service: Orthopedics;  Laterality: Right;   TOTAL KNEE ARTHROPLASTY Left 2014   TOTAL KNEE ARTHROPLASTY WITH REVISION COMPONENTS Left 04/16/2016   Procedure: LEFT TOTAL KNEE ARTHROPLASTY WITH POLY REVISION;  Surgeon: [name], MD;  Location: [facility];  Service: Orthopedics;  Laterality: Left;  with abductor block   TOTAL SHOULDER ARTHROPLASTY Right 10/16/2016   TOTAL SHOULDER ARTHROPLASTY Right 10/16/2016   Procedure: TOTAL SHOULDER ARTHROPLASTY;  Surgeon: [name], MD;  Location: [place];  Service: Orthopedics;  Laterality: Right;   [place] AND STRIPPING  2000   Social History   Tobacco Use   Smoking status: Never   Smokeless tobacco: Never  Vaping Use   Vaping Use: Never used  Substance Use Topics   Alcohol use: Not Currently    Comment: rare   Drug use: Never   Family History  Problem Relation Age of Onset   Arthritis Mother    Hypertension Mother    Arthritis Father    Cancer Father  prostate CA   Hypertension Father    Cancer Paternal Aunt        breast cancer   Arthritis Maternal Grandmother    Hypertension Maternal Grandmother    Arthritis Maternal Grandfather    Hypertension Maternal Grandfather    Diabetes Maternal Grandfather    Diabetes Brother    Breast cancer Neg Hx    No Known Allergies Current Outpatient Medications on File Prior to  Visit  Medication Sig Dispense Refill   ALPRAZolam (XANAX) 0.5 MG tablet Take 1 tablet (0.5 mg total) by mouth daily as needed for anxiety. 30 tablet 5   Calcium 500-100 MG-UNIT CHEW Chew by mouth.     Cholecalciferol (VITAMIN D) 50 MCG (2000 UT) tablet Take 4,000 Units by mouth daily.     Cranberry 400 MG CAPS Take by mouth daily.      gabapentin (NEURONTIN) 300 MG capsule TAKE 3 CAPSULES BY MOUTH 3  TIMES DAILY 810 capsule 1   Multiple Vitamins-Minerals (BARIATRIC MULTIVITAMINS/IRON) CAPS Take by mouth daily.     loratadine (CLARITIN) 10 MG tablet Take 1 tablet (10 mg total) by mouth daily. 90 tablet 0   No current facility-administered medications on file prior to visit.     Review of Systems  Constitutional:  Negative for activity change, appetite change, fatigue, fever and unexpected weight change.  HENT:  Negative for congestion, ear pain, rhinorrhea, sinus pressure and sore throat.   Eyes:  Negative for pain, redness and visual disturbance.  Respiratory:  Negative for cough, shortness of breath and wheezing.   Cardiovascular:  Negative for chest pain and palpitations.  Gastrointestinal:  Negative for abdominal pain, blood in stool, constipation and diarrhea.  Endocrine: Negative for polydipsia and polyuria.  Genitourinary:  Negative for dysuria, frequency and urgency.  Musculoskeletal:  Negative for arthralgias, back pain and myalgias.  Skin:  Negative for pallor and rash.  Allergic/Immunologic: Negative for environmental allergies.  Neurological:  Negative for dizziness, syncope and headaches.  Hematological:  Negative for adenopathy. Does not bruise/bleed easily.  Psychiatric/Behavioral:  Negative for decreased concentration and dysphoric mood. The patient is not nervous/anxious.        Objective:   Physical Exam Constitutional:      General: She is not in acute distress.    Appearance: Normal appearance. She is well-developed. She is obese. She is not ill-appearing or  diaphoretic.  HENT:     Head: Normocephalic and atraumatic.     Right Ear: Tympanic membrane, ear canal and external ear normal.     Left Ear: Tympanic membrane, ear canal and external ear normal.     Nose: Nose normal. No congestion.     Mouth/Throat:     Mouth: Mucous membranes are moist.     Pharynx: Oropharynx is clear. No posterior oropharyngeal erythema.  Eyes:     General: No scleral icterus.    Extraocular Movements: Extraocular movements intact.     Conjunctiva/sclera: Conjunctivae normal.     Pupils: Pupils are equal, round, and reactive to light.  Neck:     Thyroid: No thyromegaly.     Vascular: No carotid bruit or JVD.  Cardiovascular:     Rate and Rhythm: Normal rate and regular rhythm.     Pulses: Normal pulses.     Heart sounds: Normal heart sounds.     No gallop.  Pulmonary:     Effort: Pulmonary effort is normal. No respiratory distress.     Breath sounds: Normal breath sounds. No wheezing.  Comments: Good air exch Chest:     Chest wall: No tenderness.  Abdominal:     General: Bowel sounds are normal. There is no distension or abdominal bruit.     Palpations: Abdomen is soft. There is no mass.     Tenderness: There is no abdominal tenderness.     Hernia: No hernia is present.  Genitourinary:    Comments: Breast exam: No mass, nodules, thickening, tenderness, bulging, retraction, inflamation, nipple discharge or skin changes noted.  No axillary or clavicular LA.     Musculoskeletal:        General: No tenderness. Normal range of motion.     Cervical back: Normal range of motion and neck supple. No rigidity. No muscular tenderness.     Right lower leg: No edema.     Left lower leg: No edema.     Comments: No kyphosis   Lymphadenopathy:     Cervical: No cervical adenopathy.  Skin:    General: Skin is warm and dry.     Coloration: Skin is not pale.     Findings: No erythema or rash.     Comments: Solar lentigines diffusely   Neurological:     Mental  Status: She is alert. Mental status is at baseline.     Cranial Nerves: No cranial nerve deficit.     Motor: No abnormal muscle tone.     Coordination: Coordination normal.     Gait: Gait normal.     Deep Tendon Reflexes: Reflexes are normal and symmetric. Reflexes normal.  Psychiatric:        Mood and Affect: Mood normal.        Cognition and Memory: Cognition and memory normal.           Assessment & Plan:   Problem List Items Addressed This Visit       Cardiovascular and Mediastinum   Essential hypertension    bp in fair control at this time  BP Readings from Last 1 Encounters:  09/04/21 126/82  No changes needed (no medications currently) Most recent labs reviewed  Disc lifstyle change with low sodium diet and exercise          Genitourinary   Endometrial hyperplasia    Adv pt f/u with gyn yearly  Has IUD Overall doing well  Her gyn location is changing-will call if she needs a referral         Other   Bariatric surgery status    Had bariatric sleeve in past  She still takes nsaids and says she tolerates them well  Has gained some wt back  Enc her to continue mvi      Morbid obesity ([place])    Discussed how this problem influences overall health and the risks it imposes  Reviewed plan for weight loss with lower calorie diet (via better food choices and also portion control or program like weight watchers) and exercise building up to or more than 30 minutes 5 days per week including some aerobic activity   S/p gastric sleeve procedure in the past  Unfortunately gained  Strongly recommend low glycemic diet Also consider regular exercise program      Prediabetes    Lab Results  Component Value Date   HGBA1C 5.5 08/28/2021  disc imp of low glycemic diet and wt loss to prevent DM2       Routine general medical examination at a health care facility - Primary    Reviewed health habits including diet and  exercise and skin cancer prevention Reviewed  appropriate screening tests for age  Also reviewed health mt list, fam hx and immunization status , as well as social and family history   See HPI Labs reviewed  Has amw from [facility] at home yearly  Sent for 2nd shingrix imm date  Mammogram and colonoscopy utd Due for annual gyn f/u (iud and endo hyperplasia)  Pap utd 2021 dexa utd/normal bmd , enc her to start back on vitamin D       Vitamin D deficiency    Enc her to start back on vit D 2000 iu daily  For bone and overall health       Other Visit Diagnoses     Anxiety and depression       Relevant Medications   citalopram (CELEXA) 20 MG tablet   Health maintenance examination       Relevant Medications   meloxicam (MOBIC) 15 MG tablet

## 2021-09-04 NOTE — Assessment & Plan Note (Signed)
Enc her to start back on vit D 2000 iu daily  For bone and overall health

## 2021-09-04 NOTE — Assessment & Plan Note (Signed)
Adv pt f/u with gyn yearly  Has IUD Overall doing well  Her gyn location is changing-will call if she needs a referral

## 2021-09-04 NOTE — Assessment & Plan Note (Signed)
bp in fair control at this time  BP Readings from Last 1 Encounters:  09/04/21 126/82   No changes needed (no medications currently) Most recent labs reviewed  Disc lifstyle change with low sodium diet and exercise

## 2021-09-04 NOTE — Assessment & Plan Note (Signed)
Had bariatric sleeve in past  She still takes nsaids and says she tolerates them well  Has gained some wt back  Enc her to continue [facility]

## 2021-09-04 NOTE — Patient Instructions (Addendum)
Take at least 2000 iu of vitamin D 3 daily   Consider a regular exercise program (5 days per week)  [facility]   Stick with your gyn- give them a call/ they may be changing practice locations   Flonase or nasacort nasal spray over the counter may help your ear   To prevent diabetes Try to get most of your carbohydrates from produce (with the exception of white potatoes)  Eat less bread/pasta/rice/snack foods/cereals/sweets and other items from the middle of the grocery store (processed carbs)  For cholesterol Avoid red meat/ fried foods/ egg yolks/ fatty breakfast meats/ butter, cheese and high fat dairy/ and shellfish    Use tylenol as needed for pain  Meloxicam only when needed and always with food  There is a long term kidney risk and peptic ulcer risk with it

## 2021-09-04 NOTE — Assessment & Plan Note (Signed)
Lab Results  Component Value Date   HGBA1C 5.5 08/28/2021   disc imp of low glycemic diet and wt loss to prevent DM2

## 2021-09-25 ENCOUNTER — Ambulatory Visit: Payer: Medicare Other | Admitting: Dermatology

## 2021-09-25 DIAGNOSIS — D1800 Hemangioma unspecified site: Secondary | ICD-10-CM

## 2021-09-25 DIAGNOSIS — D229 Melanocytic nevi, unspecified: Secondary | ICD-10-CM | POA: Diagnosis not present

## 2021-09-25 DIAGNOSIS — L821 Other seborrheic keratosis: Secondary | ICD-10-CM | POA: Diagnosis not present

## 2021-09-25 DIAGNOSIS — L814 Other melanin hyperpigmentation: Secondary | ICD-10-CM | POA: Diagnosis not present

## 2021-09-25 DIAGNOSIS — L578 Other skin changes due to chronic exposure to nonionizing radiation: Secondary | ICD-10-CM

## 2021-09-25 DIAGNOSIS — Z1283 Encounter for screening for malignant neoplasm of skin: Secondary | ICD-10-CM | POA: Diagnosis not present

## 2021-09-25 DIAGNOSIS — Z86018 Personal history of other benign neoplasm: Secondary | ICD-10-CM | POA: Diagnosis not present

## 2021-09-25 DIAGNOSIS — L820 Inflamed seborrheic keratosis: Secondary | ICD-10-CM | POA: Diagnosis not present

## 2021-09-25 NOTE — Progress Notes (Signed)
Follow-Up Visit   Subjective  ANDRETTA Huffman is a 66 y.o. female who presents for the following: Annual Exam (The patient presents for Total-Body Skin Exam (TBSE) for skin cancer screening and mole check.  The patient has spots, moles and lesions to be evaluated, some may be new or changing and the patient has concerns that these could be cancer. Patient with hx of dysplastic nevus. She has an ISK tx'd at last visit with LN2 at right index finger, some residual. ).  Patient does have a spot at both arms that she picks at.   The following portions of the chart were reviewed this encounter and updated as appropriate:   Tobacco  Allergies  Meds  Problems  Med Hx  Surg Hx  Fam Hx      Review of Systems:  No other skin or systemic complaints except as noted in HPI or Assessment and Plan.  Objective  Well appearing patient in no apparent distress; mood and affect are within normal limits.  A full examination was performed including scalp, head, eyes, ears, nose, lips, neck, chest, axillae, abdomen, back, buttocks, bilateral upper extremities, bilateral lower extremities, hands, feet, fingers, toes, fingernails, and toenails. All findings within normal limits unless otherwise noted below.  Right 2nd Finger, right upper arm, left forearm (3) Erythematous stuck-on, waxy papule or plaque    Assessment & Plan  Inflamed seborrheic keratosis (3) Right 2nd Finger, right upper arm, left forearm  Residual at right index finger  Symptomatic, irritating, patient would like treated.  Prior to procedure, discussed risks of blister formation, small wound, skin dyspigmentation, or rare scar following cryotherapy. Recommend Vaseline ointment to treated areas while healing.    Destruction of lesion - Right 2nd Finger, right upper arm, left forearm  Destruction method: cryotherapy   Informed consent: discussed and consent obtained   Lesion destroyed using liquid nitrogen: Yes   Cryotherapy  cycles:  2 Outcome: patient tolerated procedure well with no complications   Post-procedure details: wound care instructions given     History of Dysplastic Nevi - No evidence of recurrence today at left shoulder - Recommend regular full body skin exams - Recommend daily broad spectrum sunscreen SPF 30+ to sun-exposed areas, reapply every 2 hours as needed.  - Call if any new or changing lesions are noted between office visits  Lentigines - Scattered tan macules - Due to sun exposure - Benign-appearing, observe - Recommend daily broad spectrum sunscreen SPF 30+ to sun-exposed areas, reapply every 2 hours as needed. - Call for any changes  Seborrheic Keratoses - Stuck-on, waxy, tan-brown papules and/or plaques  - Benign-appearing - Discussed benign etiology and prognosis. - Observe - Call for any changes  Melanocytic Nevi - Tan-brown and/or pink-flesh-colored symmetric macules and papules - Benign appearing on exam today - Observation - Call clinic for new or changing moles - Recommend daily use of broad spectrum spf 30+ sunscreen to sun-exposed areas.   Hemangiomas - Red papules - Discussed benign nature - Observe - Call for any changes  Actinic Damage - Chronic condition, secondary to cumulative UV/sun exposure - diffuse scaly erythematous macules with underlying dyspigmentation - Recommend daily broad spectrum sunscreen SPF 30+ to sun-exposed areas, reapply every 2 hours as needed.  - Staying in the shade or wearing long sleeves, sun glasses (UVA+UVB protection) and wide brim hats (4-inch brim around the entire circumference of the hat) are also recommended for sun protection.  - Call for new or changing lesions.  Skin cancer screening performed today.  Return in about 1 year (around 09/26/2022) for TBSE.  Graciella Belton, RMA, am acting as scribe for Forest Gleason, MD .  Documentation: I have reviewed the above documentation for accuracy and completeness, and I  agree with the above.  Forest Gleason, MD

## 2021-09-25 NOTE — Patient Instructions (Signed)
Cryotherapy Aftercare  Wash gently with soap and water everyday.   Apply Vaseline and Band-Aid daily until healed.   Recommend taking Heliocare sun protection supplement daily in sunny weather for additional sun protection. For maximum protection on the sunniest days, you can take up to 2 capsules of regular Heliocare OR take 1 capsule of Heliocare Ultra. For prolonged exposure (such as a full day in the sun), you can repeat your dose of the supplement 4 hours after your first dose. Heliocare can be purchased at [facility], at some [facility] or at www.[email or web address].    Melanoma ABCDEs  Melanoma is the most dangerous type of skin cancer, and is the leading cause of death from skin disease.  You are more likely to develop melanoma if you: Have light-colored skin, light-colored eyes, or red or blond hair Spend a lot of time in the sun Tan regularly, either outdoors or in a tanning bed Have had blistering sunburns, especially during childhood Have a close family member who has had a melanoma Have atypical moles or large birthmarks  Early detection of melanoma is key since treatment is typically straightforward and cure rates are extremely high if we catch it early.   The first sign of melanoma is often a change in a mole or a new dark spot.  The ABCDE system is a way of remembering the signs of melanoma.  A for asymmetry:  The two halves do not match. B for border:  The edges of the growth are irregular. C for color:  A mixture of colors are present instead of an even brown color. D for diameter:  Melanomas are usually (but not always) greater than 6mm - the size of a pencil eraser. E for evolution:  The spot keeps changing in size, shape, and color.  Please check your skin once per month between visits. You can use a small mirror in front and a large mirror behind you to keep an eye on the back side or your body.   If you see any new or changing lesions before your next follow-up,  please call to schedule a visit.  Please continue daily skin protection including broad spectrum sunscreen SPF 30+ to sun-exposed areas, reapplying every 2 hours as needed when you're outdoors.    Due to recent changes in healthcare laws, you may see results of your pathology and/or laboratory studies on MyChart before the doctors have had a chance to review them. We understand that in some cases there may be results that are confusing or concerning to you. Please understand that not all results are received at the same time and often the doctors may need to interpret multiple results in order to provide you with the best plan of care or course of treatment. Therefore, we ask that you please give us 2 business days to thoroughly review all your results before contacting the office for clarification. Should we see a critical lab result, you will be contacted sooner.   If You Need Anything After Your Visit  If you have any questions or concerns for your doctor, please call our main line at [phone] and press option 4 to reach your doctor's medical assistant. If no one answers, please leave a voicemail as directed and we will return your call as soon as possible. Messages left after 4 pm will be answered the following business day.   You may also send us a message via MyChart. We typically respond to MyChart messages within 1-2 business days.    For prescription refills, please ask your pharmacy to contact our office. Our fax number is [phone].  If you have an urgent issue when the clinic is closed that cannot wait until the next business day, you can page your doctor at the number below.    Please note that while we do our best to be available for urgent issues outside of office hours, we are not available 24/7.   If you have an urgent issue and are unable to reach us, you may choose to seek medical care at your doctor's office, retail clinic, urgent care center, or emergency room.  If you  have a medical emergency, please immediately call 911 or go to the emergency department.  Pager Numbers  - Dr. Kowalski: [phone]  - Dr. Moye: [phone]  - Dr. Stewart: [phone]  In the event of inclement weather, please call our main line at [phone] for an update on the status of any delays or closures.  Dermatology Medication Tips: Please keep the boxes that topical medications come in in order to help keep track of the instructions about where and how to use these. Pharmacies typically print the medication instructions only on the boxes and not directly on the medication tubes.   If your medication is too expensive, please contact our office at [phone] option 4 or send us a message through MyChart.   We are unable to tell what your co-pay for medications will be in advance as this is different depending on your insurance coverage. However, we may be able to find a substitute medication at lower cost or fill out paperwork to get insurance to cover a needed medication.   If a prior authorization is required to get your medication covered by your insurance company, please allow us 1-2 business days to complete this process.  Drug prices often vary depending on where the prescription is filled and some pharmacies may offer cheaper prices.  The website www.goodrx.com contains coupons for medications through different pharmacies. The prices here do not account for what the cost may be with help from insurance (it may be cheaper with your insurance), but the website can give you the price if you did not use any insurance.  - You can print the associated coupon and take it with your prescription to the pharmacy.  - You may also stop by our office during regular business hours and pick up a GoodRx coupon card.  - If you need your prescription sent electronically to a different pharmacy, notify our office through [facility] MyChart or by phone at [phone] option  4.     Si Usted Necesita Algo Despus de Su Visita  Tambin puede enviarnos un mensaje a travs de MyChart. Por lo general respondemos a los mensajes de MyChart en el transcurso de 1 a 2 das hbiles.  Para renovar recetas, por favor pida a su farmacia que se ponga en contacto con nuestra oficina. Nuestro nmero de fax es el [phone].  Si tiene un asunto urgente cuando la clnica est cerrada y que no puede esperar hasta el siguiente da hbil, puede llamar/localizar a su doctor(a) al nmero que aparece a continuacin.   Por favor, tenga en cuenta que aunque hacemos todo lo posible para estar disponibles para asuntos urgentes fuera del horario de oficina, no estamos disponibles las 24 horas del da, los 7 das de la semana.   Si tiene un problema urgente y no puede comunicarse con nosotros, puede optar por buscar atencin mdica  en   el consultorio de su doctor(a), en una clnica privada, en un centro de atencin urgente o en una sala de emergencias.  Si tiene una emergencia mdica, por favor llame inmediatamente al 911 o vaya a la sala de emergencias.  Nmeros de bper  - Dr. Kowalski: [phone]  - Dra. Moye: [phone]  - Dra. Stewart: [phone]  En caso de inclemencias del tiempo, por favor llame a nuestra lnea principal al [phone] para una actualizacin sobre el estado de cualquier retraso o cierre.  Consejos para la medicacin en dermatologa: Por favor, guarde las cajas en las que vienen los medicamentos de uso tpico para ayudarle a seguir las instrucciones sobre dnde y cmo usarlos. Las farmacias generalmente imprimen las instrucciones del medicamento slo en las cajas y no directamente en los tubos del medicamento.   Si su medicamento es muy caro, por favor, pngase en contacto con nuestra oficina llamando al [phone] y presione la opcin 4 o envenos un mensaje a travs de MyChart.   No podemos decirle cul ser su copago por los medicamentos por  adelantado ya que esto es diferente dependiendo de la cobertura de su seguro. Sin embargo, es posible que podamos encontrar un medicamento sustituto a menor costo o llenar un formulario para que el seguro cubra el medicamento que se considera necesario.   Si se requiere una autorizacin previa para que su compaa de seguros cubra su medicamento, por favor permtanos de 1 a 2 das hbiles para completar este proceso.  Los precios de los medicamentos varan con frecuencia dependiendo del lugar de dnde se surte la receta y alguna farmacias pueden ofrecer precios ms baratos.  El sitio web www.goodrx.com tiene cupones para medicamentos de diferentes farmacias. Los precios aqu no tienen en cuenta lo que podra costar con la ayuda del seguro (puede ser ms barato con su seguro), pero el sitio web puede darle el precio si no utiliz ningn seguro.  - Puede imprimir el cupn correspondiente y llevarlo con su receta a la farmacia.  - Tambin puede pasar por nuestra oficina durante el horario de atencin regular y recoger una tarjeta de cupones de GoodRx.  - Si necesita que su receta se enve electrnicamente a una farmacia diferente, informe a nuestra oficina a travs de MyChart de [place] o por telfono llamando al [phone] y presione la opcin 4.  

## 2021-09-30 ENCOUNTER — Encounter: Payer: Self-pay | Admitting: Obstetrics & Gynecology

## 2021-09-30 ENCOUNTER — Encounter: Payer: Self-pay | Admitting: Dermatology

## 2021-09-30 ENCOUNTER — Ambulatory Visit (INDEPENDENT_AMBULATORY_CARE_PROVIDER_SITE_OTHER): Payer: Medicare Other | Admitting: Obstetrics & Gynecology

## 2021-09-30 VITALS — BP 160/94 | HR 66 | Resp 16 | Ht 64.5 in | Wt 242.2 lb

## 2021-09-30 DIAGNOSIS — T8332XA Displacement of intrauterine contraceptive device, initial encounter: Secondary | ICD-10-CM | POA: Diagnosis not present

## 2021-09-30 DIAGNOSIS — R9389 Abnormal findings on diagnostic imaging of other specified body structures: Secondary | ICD-10-CM | POA: Diagnosis not present

## 2021-09-30 NOTE — Progress Notes (Unsigned)
ANNUAL PREVENTATIVE CARE GYNECOLOGY  ENCOUNTER NOTE  Subjective:       Katrina Huffman is a 66 y.o. No obstetric history on file. female here for a routine annual gynecologic exam. The patient {is/is not/has never been:13135} sexually active. The patient {is/is not:13135} taking hormone replacement therapy. Patient denies post-menopausal vaginal bleeding. The patient wears seatbelts: yes. The patient participates in regular exercise: no. Has the patient ever been transfused or tattooed?: no. The patient reports that there is not domestic violence in her life.  Current complaints: 1.  She has a IUD (Mirena) in for post menopausal bleeding. IUD was placed to help shrink the lining of her uterus. She has had an IUD in for 7 years.   Gynecologic History No LMP recorded. Patient is postmenopausal. Contraception: IUD Last Pap: ***. Results were: {norm/abn:16337} Last mammogram: ***. Results were: {norm/abn:16337} Last Colonoscopy:  Last Dexa Scan:    Obstetric History OB History  No obstetric history on file.    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   History of 2019 novel coronavirus disease (COVID-19) 02/28/2019   positive results in care everywhere;  12-15-2019  per pt mild symptoms resolved in just over 2 wks with no residual   History of hypertension    per pt hx dx HTN prior to gastric bypass 12/ 2018,  since lost wt no issue   OSA (obstructive sleep apnea)    12-15-2019 per pt has not used cpap since 01/ 2021 stated does not need   PONV (postoperative nausea and vomiting)    PONV after knee arthroplasty   Right ureteral stone    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   Scoliosis    sees chiropracter monthly   Wears  glasses     Family History  Problem Relation Age of Onset   Arthritis Mother    Hypertension Mother    Arthritis Father    Cancer Father        prostate CA   Hypertension Father    Cancer Paternal Aunt        breast cancer   Arthritis Maternal Grandmother    Hypertension Maternal Grandmother    Arthritis Maternal Grandfather    Hypertension Maternal Grandfather    Diabetes Maternal Grandfather    Diabetes Brother    Breast cancer Neg Hx     Past Surgical History:  Procedure Laterality Date   BREAST SURGERY  1999   breast biopsy   BUNIONECTOMY  2000   CESAREAN SECTION  1983; 1985   COLONOSCOPY WITH PROPOFOL N/A 04/30/2015   Procedure: COLONOSCOPY WITH PROPOFOL;  Surgeon: [name], MD;  Location: [facility];  Service: Endoscopy;  Laterality: N/A;   COLONOSCOPY WITH PROPOFOL N/A 05/03/2019   Procedure: COLONOSCOPY WITH PROPOFOL;  Surgeon: [name], MD;  [facility] ENDOSCOPY;  Service: Endoscopy;  Laterality: N/A;  patient was COVID POSITIVE on 02/26/2019   CYSTOSCOPY WITH RETROGRADE PYELOGRAM, URETEROSCOPY AND STENT PLACEMENT Right 12/22/2019   Procedure: CYSTOSCOPY WITH RIGHT RETROGRADE PYELOGRAM, URETEROSCOPY WITH HOLMIUM LASER , BASKET EXTRACTION OF STONES AND STENT PLACEMENT;  Surgeon: Ardis Hughs, MD;  Location: [facility];  Service: Urology;  Laterality: Right;  DIAGNOSTIC LAPAROSCOPY  01/19/2017   LOA w/ Gastropexy/ EGD   EXTRACORPOREAL SHOCK WAVE LITHOTRIPSY Right 08/22/2019   Procedure: RIGHT EXTRACORPOREAL SHOCK WAVE LITHOTRIPSY (ESWL);  Surgeon: Ceasar Mons, MD;  Location[facility];  Service: Urology;  Laterality: Right;   HYSTEROSCOPY WITH D & C  x2  last one 01/ 2017   KNEE ARTHROSCOPY Left 05/09/2015   Procedure: ARTHROSCOPY LEFT KNEE WITH SYNOVECTOMY;  Surgeon: [name], MD;  Location: [facility];  Service: Orthopedics;  Laterality: Left;   KNEE ARTHROSCOPY Left 04/27/2017   Procedure: Left  knee arthroscopy; synovectomy;  Surgeon: [name], MD;  Location: [facility];  Service: Orthopedics;  Laterality: Left;   KNEE ARTHROSCOPY Left 2009   LAPAROSCOPIC GASTRIC SLEEVE RESECTION  01/14/2017   POLYPECTOMY  04/30/2015   Procedure: POLYPECTOMY;  Surgeon: [name], MD;  Location: [facility];  Service: Endoscopy;;   TONSILLECTOMY AND ADENOIDECTOMY  2007   TOTAL KNEE ARTHROPLASTY Right 04/27/2017   Procedure: RIGHT TOTAL KNEE ARTHROPLASTY;  Surgeon: [name], MD;  Location: [facility];  Service: Orthopedics;  Laterality: Right;   TOTAL KNEE ARTHROPLASTY Left 2014   TOTAL KNEE ARTHROPLASTY WITH REVISION COMPONENTS Left 04/16/2016   Procedure: LEFT TOTAL KNEE ARTHROPLASTY WITH POLY REVISION;  Surgeon: [name], MD;  Location: [facility];  Service: Orthopedics;  Laterality: Left;  with abductor block   TOTAL SHOULDER ARTHROPLASTY Right 10/16/2016   TOTAL SHOULDER ARTHROPLASTY Right 10/16/2016   Procedure: TOTAL SHOULDER ARTHROPLASTY;  Surgeon: [name], MD;  Location: [facility];  Service: Orthopedics;  Laterality: Right;   TUBAL LIGATION  1986 & 1991   VEIN LIGATION AND STRIPPING  2000    Social History   Socioeconomic History   Marital status: Divorced    Spouse name: Not on file   Number of children: Not on file   Years of education: Not on file   Highest education level: Not on file  Occupational History   Not on file  Tobacco Use   Smoking status: Never   Smokeless tobacco: Never  Vaping Use   Vaping Use: Never used  Substance and Sexual Activity   Alcohol use: Not Currently    Comment: rare   Drug use: Never   Sexual activity: Not on file  Other Topics Concern   Not on file  Social History Narrative   Right handed   Drinks caffeine   One story home   Social Determinants of Health   Financial Resource Strain: Low Risk  (06/10/2021)   Overall Financial Resource Strain (CARDIA)    Difficulty of Paying Living Expenses: Not very hard  Food Insecurity:  No Food Insecurity (06/10/2021)   Hunger Vital Sign    Worried About Running Out of Food in the Last Year: Never true    Ran Out of Food in the Last Year: Never true  Transportation Needs: No Transportation Needs (06/10/2021)   PRAPARE - Hydrologist (Medical): No    Lack of Transportation (Non-Medical): No  Physical Activity: Insufficiently Active (06/10/2021)   Exercise Vital Sign    Days of Exercise per Week: 5 days    Minutes of Exercise per Session: 20 min  Stress: Stress Concern Present (06/10/2021)   [place]    Feeling of Stress : To some extent  Social Connections: Moderately Integrated (06/10/2021)   Social Connection and Isolation Panel [NHANES]    Frequency of Communication with Friends and Family:  Once a week    Frequency of Social Gatherings with Friends and Family: More than three times a week    Attends Religious Services: More than 4 times per year    Active Member of [employer] or Organizations: Yes    Attends Music therapist: More than 4 times per year    Marital Status: Divorced  Intimate Partner Violence: Not At Risk (06/10/2021)   Humiliation, Afraid, Rape, and Kick questionnaire    Fear of Current or Ex-Partner: No    Emotionally Abused: No    Physically Abused: No    Sexually Abused: No    Current Outpatient Medications on File Prior to Visit  Medication Sig Dispense Refill   ALPRAZolam (XANAX) 0.5 MG tablet Take 1 tablet (0.5 mg total) by mouth daily as needed for anxiety. 30 tablet 5   Calcium 500-100 MG-UNIT CHEW Chew by mouth.     Cholecalciferol (VITAMIN D) 50 MCG (2000 UT) tablet Take 4,000 Units by mouth daily.     citalopram (CELEXA) 20 MG tablet Take 1 tablet (20 mg total) by mouth daily. 90 tablet 3   Cranberry 400 MG CAPS Take by mouth daily.      gabapentin (NEURONTIN) 300 MG capsule TAKE 3 CAPSULES BY MOUTH 3  TIMES DAILY 810 capsule 1   meloxicam  (MOBIC) 15 MG tablet Take 1 tablet (15 mg total) by mouth daily as needed for pain. With food 90 tablet 0   Multiple Vitamins-Minerals (BARIATRIC MULTIVITAMINS/IRON) CAPS Take by mouth daily.     No current facility-administered medications on file prior to visit.    No Known Allergies    Review of Systems ROS Review of Systems - General ROS: negative for - chills, fatigue, fever, hot flashes, night sweats, weight gain or weight loss Psychological ROS: negative for - anxiety, decreased libido, depression, mood swings, physical abuse or sexual abuse Ophthalmic ROS: negative for - blurry vision, eye pain or loss of vision ENT ROS: negative for - headaches, hearing change, visual changes or vocal changes Allergy and Immunology ROS: negative for - hives, itchy/watery eyes or seasonal allergies Hematological and Lymphatic ROS: negative for - bleeding problems, bruising, swollen lymph nodes or weight loss Endocrine ROS: negative for - galactorrhea, hair pattern changes, hot flashes, malaise/lethargy, mood swings, palpitations, polydipsia/polyuria, skin changes, temperature intolerance or unexpected weight changes Breast ROS: negative for - new or changing breast lumps or nipple discharge Respiratory ROS: negative for - cough or shortness of breath Cardiovascular ROS: negative for - chest pain, irregular heartbeat, palpitations or shortness of breath Gastrointestinal ROS: no abdominal pain, change in bowel habits, or black or bloody stools Genito-Urinary ROS: no dysuria, trouble voiding, or hematuria Musculoskeletal ROS: negative for - joint pain or joint stiffness Neurological ROS: negative for - bowel and bladder control changes Dermatological ROS: negative for rash and skin lesion changes   Objective:   BP (!) 160/94   Pulse 66   Resp 16   Ht 5' 4.5" (1.638 m)   Wt 242 lb 3.2 oz (109.9 kg)   SpO2 97%   BMI 40.93 kg/m  CONSTITUTIONAL: Well-developed, well-nourished female in no acute  distress.  PSYCHIATRIC: Normal mood and affect. Normal behavior. Normal judgment and thought content. [place]: Alert and oriented to person, place, and time. Normal muscle tone coordination. No cranial nerve deficit noted. HENT:  Normocephalic, atraumatic, External right and left ear normal. Oropharynx is clear and moist EYES: Conjunctivae and EOM are normal. Pupils are equal, round, and reactive  to light. No scleral icterus.  NECK: Normal range of motion, supple, no masses.  Normal thyroid.  SKIN: Skin is warm and dry. No rash noted. Not diaphoretic. No erythema. No pallor. CARDIOVASCULAR: Normal heart rate noted, regular rhythm, no murmur. RESPIRATORY: Clear to auscultation bilaterally. Effort and breath sounds normal, no problems with respiration noted. BREASTS: Symmetric in size. No masses, skin changes, nipple drainage, or lymphadenopathy. ABDOMEN: Soft, normal bowel sounds, no distention noted.  No tenderness, rebound or guarding.  BLADDER: Normal PELVIC:  Bladder {:311640}  Urethra: {:311719}  Vulva: {:311722}  Vagina: {:311643}  Cervix: {:311644}  Uterus: {:311718}  Adnexa: {:311645}  RV: {Blank multiple:19196::"External Exam NormaI","No Rectal Masses","Normal Sphincter tone"}  MUSCULOSKELETAL: Normal range of motion. No tenderness.  No cyanosis, clubbing, or edema.  2+ distal pulses. LYMPHATIC: No Axillary, Supraclavicular, or Inguinal Adenopathy.   Labs: Lab Results  Component Value Date   WBC 4.7 08/28/2021   HGB 12.8 08/28/2021   HCT 38.9 08/28/2021   MCV 89.3 08/28/2021   PLT 232.0 08/28/2021    Lab Results  Component Value Date   CREATININE 0.78 08/28/2021   BUN 22 08/28/2021   NA 141 08/28/2021   K 4.8 08/28/2021   CL 103 08/28/2021   CO2 33 (H) 08/28/2021    Lab Results  Component Value Date   ALT 11 08/28/2021   AST 14 08/28/2021   ALKPHOS 83 08/28/2021   BILITOT 0.5 08/28/2021    Lab Results  Component Value Date   CHOL 181 08/28/2021   HDL  53.80 08/28/2021   LDLCALC 108 (H) 08/28/2021   TRIG 96.0 08/28/2021   CHOLHDL 3 08/28/2021    Lab Results  Component Value Date   TSH 3.42 08/28/2021    Lab Results  Component Value Date   HGBA1C 5.5 08/28/2021     Assessment:   No diagnosis found.   Plan:  Pap: {Blank multiple:19196::"Pap, Reflex if ASCUS","Pap Co Test","GC/CT NAAT","Not needed","Not done"} Mammogram: {Blank multiple:19196::"***","Ordered","Not Ordered","Not Indicated"} Colon Screening:  {Blank multiple:19196::"***","Ordered","Not Ordered","Not Indicated"} Labs: {Blank multiple:19196::"Lipid 1","FBS","TSH","Hemoglobin A1C","Vit D Level""***"} Routine preventative health maintenance measures emphasized: {Blank multiple:19196::"Exercise/Diet/Weight control","Tobacco Warnings","Alcohol/Substance use risks","Stress Management","Peer Pressure Issues","Safe Sex"} COVID Vaccination status: Return to [place], [place]

## 2021-10-10 ENCOUNTER — Other Ambulatory Visit: Payer: Medicare Other

## 2021-10-21 ENCOUNTER — Ambulatory Visit: Payer: Medicare Other | Admitting: Obstetrics & Gynecology

## 2021-10-28 ENCOUNTER — Ambulatory Visit
Admission: RE | Admit: 2021-10-28 | Discharge: 2021-10-28 | Disposition: A | Discharge status: Home / Self Care | Payer: Medicare Other | Source: Ambulatory Visit | Attending: Obstetrics & Gynecology | Admitting: Obstetrics & Gynecology

## 2021-10-28 DIAGNOSIS — T8332XA Displacement of intrauterine contraceptive device, initial encounter: Secondary | ICD-10-CM | POA: Diagnosis not present

## 2021-10-28 DIAGNOSIS — R9389 Abnormal findings on diagnostic imaging of other specified body structures: Secondary | ICD-10-CM | POA: Diagnosis not present

## 2021-10-29 ENCOUNTER — Other Ambulatory Visit: Payer: Self-pay | Admitting: Family Medicine

## 2021-10-29 DIAGNOSIS — Z0000 Encounter for general adult medical examination without abnormal findings: Secondary | ICD-10-CM

## 2021-10-30 NOTE — Telephone Encounter (Signed)
Refill request Mobic Last office visit 09/04/21 Last refill 09/04/21 #[phone]

## 2021-11-10 IMAGING — MR MR HEAD WO/W CM
12 series · 48 of 48 positions shown · IV contrast (multihance)
Comparison: None.

CLINICAL DATA: Headache, left trigeminal paresthesias and pain

EXAM:
MRI HEAD WITHOUT AND WITH CONTRAST
TECHNIQUE: Multiplanar, multiecho pulse sequences of the brain and surrounding
structures were obtained without and with intravenous contrast.
CONTRAST:  20mL MULTIHANCE GADOBENATE DIMEGLUMINE 529 MG/ML IV SOLN

[Series 2: t1_se_sag · sagittal · 5.0mm · 0.45mm/px · 1 of 24 slices shown]
[im 1/24]
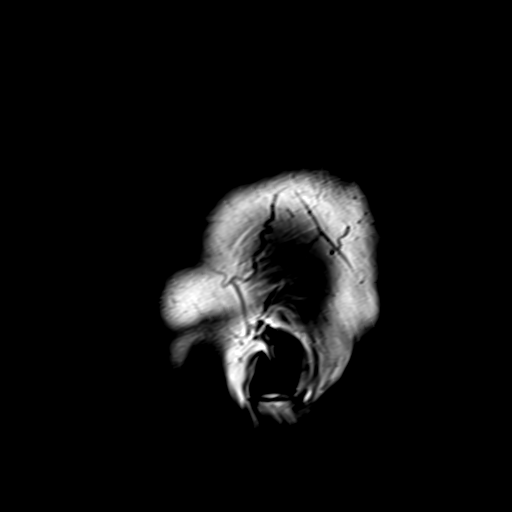

[Series 3: ep2d_diff_3 · axial · 3.0mm · 1.80mm/px · z∈[-47,+103]mm · 6 of 100 slices shown]
[im 1/100]
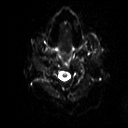
[im 20/100]
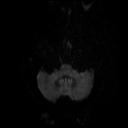
[im 40/100]
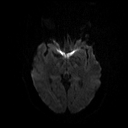
[im 60/100]
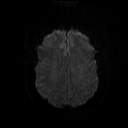
[im 80/100]
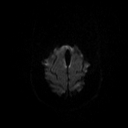
[im 100/100]
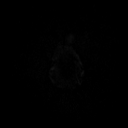

[Series 4: ep2d_diff_3_adc · axial · 3.0mm · 1.80mm/px · z∈[-47,+103]mm · 3 of 53 slices shown]
[im 1/53]
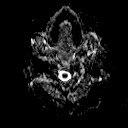
[im 27/53]
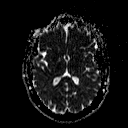
[im 53/53]
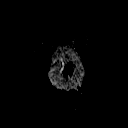

[Series 5: ep2d_diff_cor · coronal · 5.0mm · 1.77mm/px · 3 of 54 slices shown]
[im 1/54]
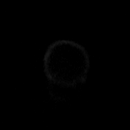
[im 27/54]
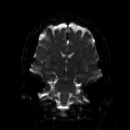
[im 54/54]
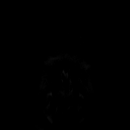

[Series 6: ep2d_diff_cor_adc · coronal · 5.0mm · 1.77mm/px · 2 of 28 slices shown]
[im 1/28]
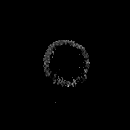
[im 28/28]
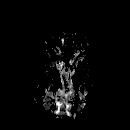

[Series 8: swi_images · axial · 2.0mm · 0.90mm/px · z∈[-47,+105]mm · 5 of 80 slices shown]
[im 1/80]
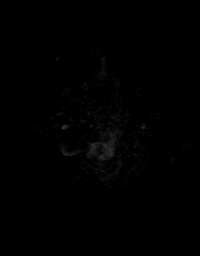
[im 20/80]
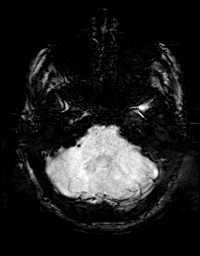
[im 40/80]
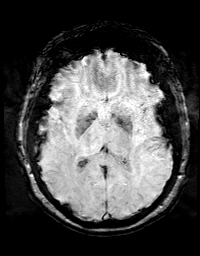
[im 60/80]
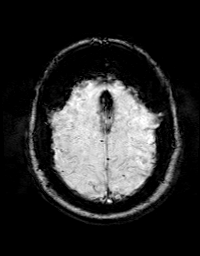
[im 80/80]
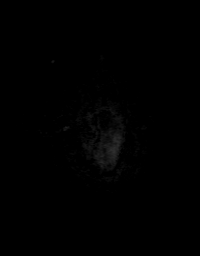

[Series 9: FLAIR · axial · 3.0mm · 0.43mm/px · z∈[-48,+102]mm · 2 of 27 slices shown]
[im 1/27]
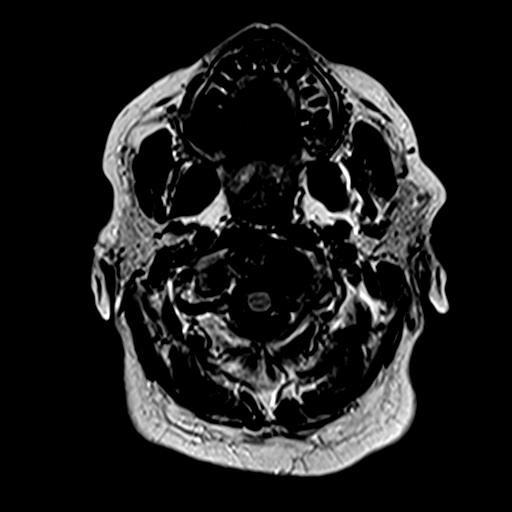
[im 27/27]
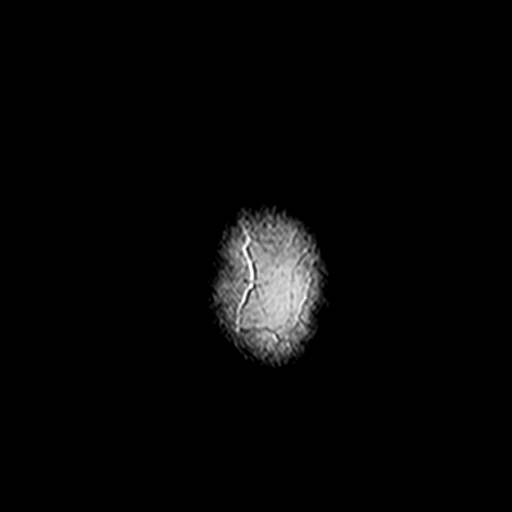

[Series 10: t2_tse_tra_512 · axial · 5.0mm · 0.60mm/px · z∈[-44,+101]mm · 2 of 26 slices shown]
[im 1/26]
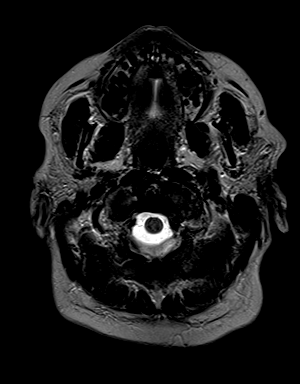
[im 26/26]
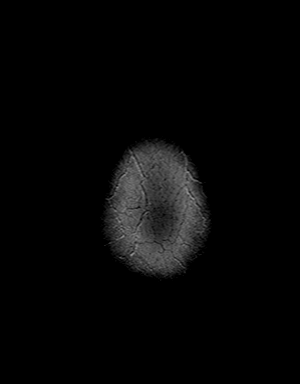

[Series 11: t1_mpr_tra · axial · 1.0mm · 0.72mm/px · z∈[-48,+105]mm · 10 of 160 slices shown]
[im 1/160]
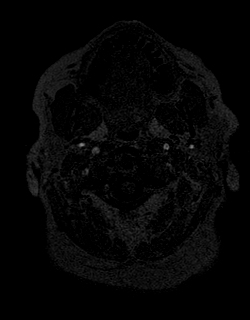
[im 18/160]
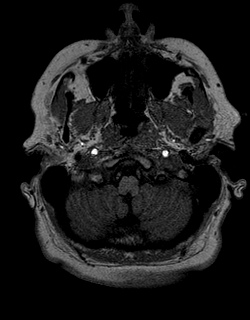
[im 36/160]
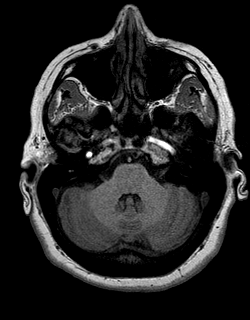
[im 54/160]
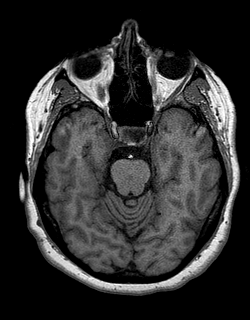
[im 71/160]
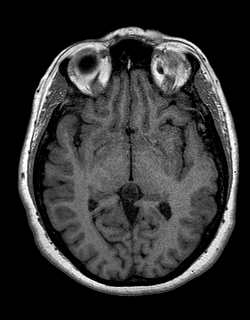
[im 89/160]
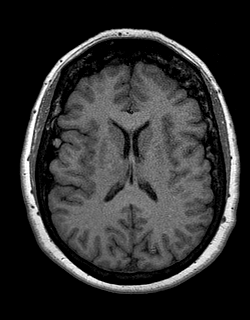
[im 107/160]
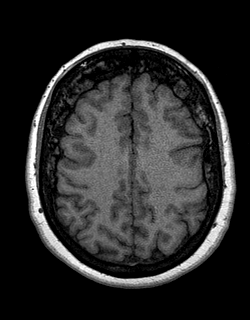
[im 124/160]
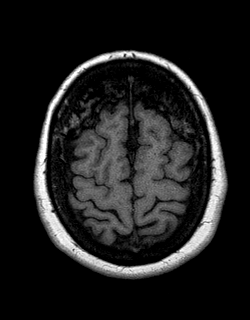
[im 142/160]
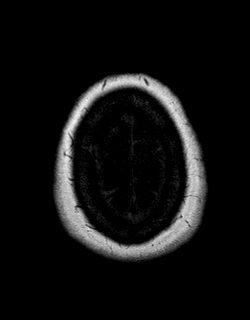
[im 160/160]
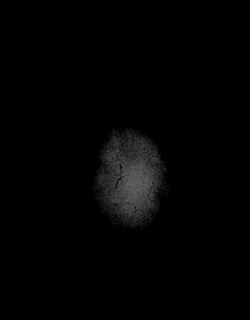

[Series 12: T2 · coronal · 5.0mm · 0.45mm/px · 2 of 28 slices shown]
[im 1/28]
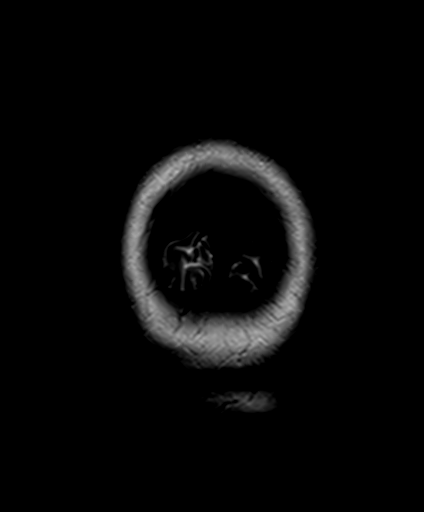
[im 28/28]
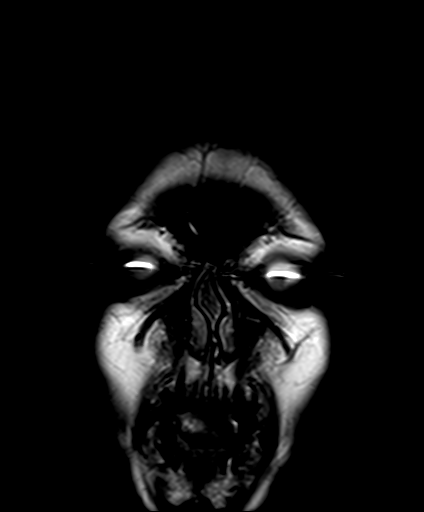

[Series 13: T1 post-contrast · coronal · 5.0mm · 0.72mm/px · 2 of 28 slices shown]
[im 1/28]
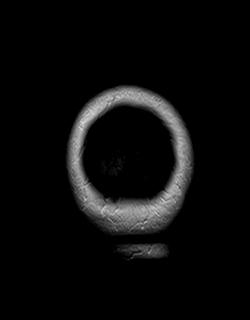
[im 28/28]
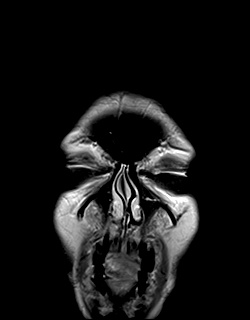

[Series 14: post t1_mpr_tra · axial · 1.0mm · 0.72mm/px · z∈[-48,+105]mm · 10 of 160 slices shown]
[im 1/160]
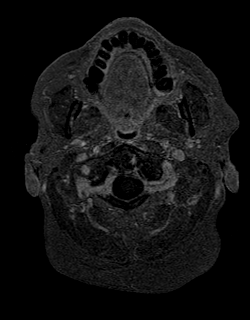
[im 18/160]
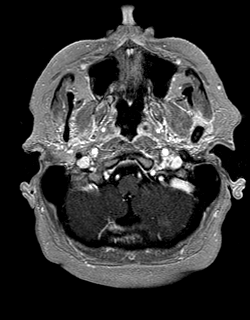
[im 36/160]
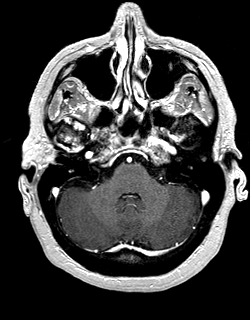
[im 54/160]
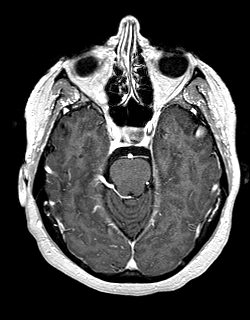
[im 71/160]
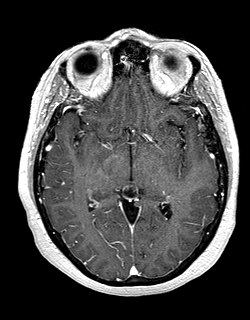
[im 89/160]
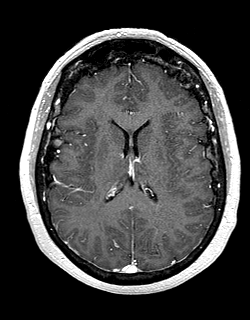
[im 107/160]
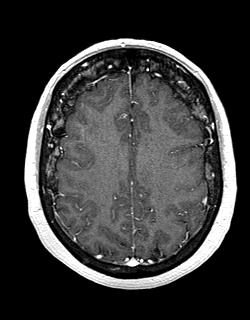
[im 124/160]
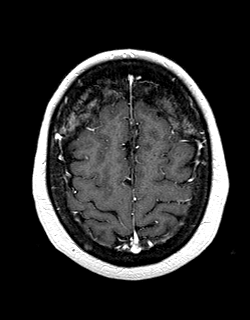
[im 142/160]
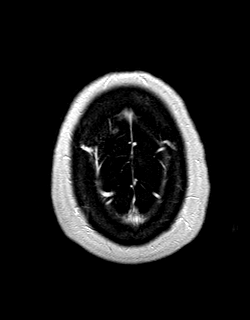
[im 160/160]
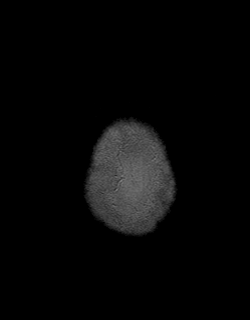

[48 of 48 positions shown; findings below may reference images not displayed]

FINDINGS: Brain: There is no acute infarction or intracranial hemorrhage.
There is no intracranial mass, mass effect, or edema. There is no
hydrocephalus or extra-axial fluid collection. Ventricles and sulci
are normal in size and configuration. Minimal small foci of T2
hyperintensity in the supratentorial white matter likely reflect
nonspecific gliosis/demyelination of doubtful clinical significance
no abnormal enhancement.

Vascular: Major vessel flow voids at the skull base are preserved.

Skull and upper cervical spine: Normal marrow signal is preserved.

Sinuses/Orbits: Paranasal sinuses are aerated. Orbits are
unremarkable.

Other: Incidental note is made of a partially empty sella. Mastoid
air cells are clear.
IMPRESSION: No intracranial mass or abnormal enhancement.

## 2021-11-21 ENCOUNTER — Ambulatory Visit: Payer: Medicare Other | Admitting: Obstetrics and Gynecology

## 2021-11-21 ENCOUNTER — Encounter: Payer: Self-pay | Admitting: Obstetrics and Gynecology

## 2021-11-21 VITALS — BP 157/74 | HR 72 | Resp 16 | Ht 65.5 in | Wt 244.8 lb

## 2021-11-21 DIAGNOSIS — T8332XD Displacement of intrauterine contraceptive device, subsequent encounter: Secondary | ICD-10-CM

## 2021-11-21 DIAGNOSIS — Z8742 Personal history of other diseases of the female genital tract: Secondary | ICD-10-CM

## 2021-11-21 DIAGNOSIS — N83201 Unspecified ovarian cyst, right side: Secondary | ICD-10-CM | POA: Diagnosis not present

## 2021-11-21 NOTE — Progress Notes (Signed)
GYNECOLOGY PROGRESS NOTE  Subjective:    Patient ID: Katrina Huffman, female    DOB: 1955/12/27, 66 y.o.   MRN: [id number]  HPI  Patient is a 66 y.o. postmenopausal female who presents for follow up after pelvic ultrasound.  She currently has an IUD in place, inserted last year. This is her second IUD (first one was inserted ~ 6 years ago due to findings of endometrial hyperplasia). IUD threads unable to be visualized on last exam performed by Dr. Langley Gauss in August during routine exam. Patient reports intermittent twinges of pain since insertion, reports very difficult and painful insertion.   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  She  has a past surgical history that includes Cesarean section (1983; 1985); Tubal ligation ([place]); Vein ligation and stripping (2000); Bunionectomy (2000); Breast surgery (1999); Colonoscopy with propofol (N/A, 04/30/2015); polypectomy (04/30/2015); Knee arthroscopy (Left, 05/09/2015); Total knee arthroplasty with revision components (Left, 04/16/2016); Total shoulder arthroplasty (Right, 10/16/2016); Total shoulder arthroplasty (Right, 10/16/2016); Total knee arthroplasty (Right, 04/27/2017); Knee arthroscopy (Left, 04/27/2017); Colonoscopy with propofol (N/A, 05/03/2019); Extracorporeal shock wave lithotripsy (Right, 08/22/2019); Tonsillectomy and adenoidectomy (2007); Hysteroscopy with D & C (x2  last one 01/ 2017); Knee arthroscopy (Left, 2009); Total knee arthroplasty (Left, 2014); Laparoscopic gastric sleeve resection (01/14/2017); Diagnostic laparoscopy (01/19/2017);  and Cystoscopy with retrograde pyelogram, ureteroscopy and stent placement (Right, 12/22/2019).  Her family history includes Arthritis in her father, maternal grandfather, maternal grandmother, and mother; Cancer in her father and paternal aunt; Diabetes in her brother and maternal grandfather; Hypertension in her father, maternal grandfather, maternal grandmother, and mother.  She  reports that she has never smoked. She has never used smokeless tobacco. She reports that she does not currently use alcohol. She reports that she does not use drugs.  Current Outpatient Medications on File Prior to Visit  Medication Sig Dispense Refill   ALPRAZolam (XANAX) 0.5 MG tablet Take 1 tablet (0.5 mg total) by mouth daily as needed for anxiety. 30 tablet 5   Calcium 500-100 MG-UNIT CHEW Chew by mouth.     Cholecalciferol (VITAMIN D) 50 MCG (2000 UT) tablet Take 4,000 Units by mouth daily.     citalopram (CELEXA) 20 MG tablet Take 1 tablet (20 mg total) by mouth daily. 90 tablet 3   Cranberry 400 MG CAPS Take by mouth daily.      gabapentin (NEURONTIN) 300 MG capsule TAKE 3 CAPSULES BY MOUTH 3  TIMES DAILY 810 capsule 1   meloxicam (MOBIC) 15 MG tablet TAKE 1 TABLET BY MOUTH DAILY AS  NEEDED FOR PAIN WITH FOOD 100 tablet 1   Multiple Vitamins-Minerals (BARIATRIC MULTIVITAMINS/IRON) CAPS Take by mouth daily.     No current facility-administered medications on file prior to visit.   She has No Known Allergies..  Review of Systems Pertinent items noted in HPI and remainder of comprehensive ROS otherwise negative.   Objective:   Blood pressure (!) 157/74, pulse 72, resp. rate 16, height 5' 5.5" (1.664 m), weight 244 lb 12.8 oz (111 kg). Body mass index is 40.12 kg/m. General appearance: alert and no distress Remainder of exam deferred   Imaging:  US PELVIS TRANSVAGINAL NON-OB (TV  ONLY) CLINICAL DATA:  IUD position and endometrial thickness  EXAM: ULTRASOUND PELVIS  TRANSVAGINAL  TECHNIQUE: Transvaginal ultrasound examination of the pelvis was performed including evaluation of the uterus, ovaries, adnexal regions, and pelvic cul-de-sac.  COMPARISON:  None Available.  FINDINGS: Uterus  Measurements: 7.1 x 3.1 x 4.2 cm = volume: 48 mL. No fibroids or other mass visualized.  Endometrium  Thickness: 4 mm. IUD difficult to visualized. Appears to be within the upper uterine segment, T arms poorly visual ball on transverse images.  Right ovary  Measurements: 2.5 x 1.4 x 1.9 cm = volume: 3.5 mL. Cyst measuring 13 x 11 x 11 mm  Left ovary  Not seen  Other findings:  No abnormal free fluid  IMPRESSION: 1. Difficult visualization of IUD, appears to be within the upper uterine segment endometrium 2. Normal postmenopausal endometrial thickness of 4 mm 3. 13 mm right ovarian cyst. No followup imaging recommended. Note: This recommendation does not apply to premenarchal patients or to those with increased risk (genetic, family history, elevated tumor markers or other high-risk factors) of ovarian cancer. Reference: Radiology 2019 Nov; 293(2):359-371.  Electronically Signed   By: Donavan Foil M.D.   On: 10/28/2021 20:06   Assessment:   1. Intrauterine contraceptive device threads lost, subsequent encounter   2. Right ovarian cyst   3. History of endometrial hyperplasia     Plan:   1. Intrauterine contraceptive device threads lost, subsequent encounter - Exam deferred today. Discussed ultrasound findings, IUD in upper uterine segment but arms unable to be visualized. Threads lost. Discussed whether IUD was still necessary at this time as patient well beyond menopausal years. Patient notes she is fine with removal if IUD no longer necessary, but would prefer to have it performed under Anesthesia, as removal attempt with last visit was very painful and traumatic for her.   2. Right ovarian cyst - Small right ovarian cyst. Discussed that  this was incidental finding, no concerns at this time as it appears simple and benign.  No further surveillance warranted.   3. History of endometrial hyperplasia - Patient with prior history of endometrial hyperplasia, with IUD in place. Discussed that risk for hyperplasia was likely lower now based on age. Not likely to require further management at this time. Can keep IUD in place if desired or can opt for removal. Patient would like removal if device is not necessary but will wait until after the new year. Currently endometrial lining wnl for postmenopausal female.    Return in about 4 months (around 03/24/2022) for f/u lost IUD threads.   A total of 30 minutes were spent during this encounter, including review of previous progress notes, recent imaging and labs, face-to-face with time with patient involving counseling and coordination of care, as well as documentation for current visit.   Rubie Maid, MD [facility]

## 2021-12-12 ENCOUNTER — Encounter: Payer: Self-pay | Admitting: Internal Medicine

## 2021-12-12 ENCOUNTER — Ambulatory Visit (INDEPENDENT_AMBULATORY_CARE_PROVIDER_SITE_OTHER): Payer: Medicare Other | Admitting: Internal Medicine

## 2021-12-12 VITALS — BP 120/72 | HR 95 | Temp 98.1°F | Ht 65.5 in | Wt 237.0 lb

## 2021-12-12 DIAGNOSIS — J0110 Acute frontal sinusitis, unspecified: Secondary | ICD-10-CM | POA: Insufficient documentation

## 2021-12-12 MED ORDER — AMOXICILLIN-POT CLAVULANATE 875-125 MG PO TABS: 1.0000 | ORAL_TABLET | Freq: Two times a day (BID) | ORAL | 0 refills | Status: DC

## 2021-12-12 NOTE — Assessment & Plan Note (Signed)
Sick and not improving after a week Will try augmentin 875 bid for a week Discussed analgesics

## 2021-12-12 NOTE — Progress Notes (Signed)
Subjective:    Patient ID: Katrina Huffman, female    DOB: [birth year], 66 y.o.   MRN: [id number]  HPI Here due to a respiratory infection  Symptoms started a week ago Able to work the next day--but didn't feel well Sick since then Fever earlier this week--up to 101.7 (but not yesterday or today) Increased sleeping--fatigued Now feels worse than before  Feels like pressure on chest Runny nose Voice off Some cough--swallows sputum Not really SOB--or a little   Had headache--thought it may be caffeine withdrawal (went back and it went away)  COVID test negative--3 days after illness Tried theraflu then OTC geltabs for cold---no clear help  Current Outpatient Medications on File Prior to Visit  Medication Sig Dispense Refill   ALPRAZolam (XANAX) 0.5 MG tablet Take 1 tablet (0.5 mg total) by mouth daily as needed for anxiety. 30 tablet 5   Calcium 500-100 MG-UNIT CHEW Chew by mouth.     Cholecalciferol (VITAMIN D) 50 MCG (2000 UT) tablet Take 4,000 Units by mouth daily.     citalopram (CELEXA) 20 MG tablet Take 1 tablet (20 mg total) by mouth daily. 90 tablet 3   Cranberry 400 MG CAPS Take by mouth daily.      gabapentin (NEURONTIN) 300 MG capsule TAKE 3 CAPSULES BY MOUTH 3  TIMES DAILY 810 capsule 1   meloxicam (MOBIC) 15 MG tablet TAKE 1 TABLET BY MOUTH DAILY AS  NEEDED FOR PAIN WITH FOOD 100 tablet 1   Multiple Vitamins-Minerals (BARIATRIC MULTIVITAMINS/IRON) CAPS Take by mouth daily.     No current facility-administered medications on file prior to visit.    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   History of 2019 novel coronavirus disease (COVID-19) 02/28/2019   positive results in care everywhere;   12-15-2019  per pt mild symptoms resolved in just over 2 wks with no residual   History of hypertension    per pt hx dx HTN prior to gastric bypass 12/ 2018,  since lost wt no issue   OSA (obstructive sleep apnea)    12-15-2019 per pt has not used cpap since 01/ 2021 stated does not need   PONV (postoperative nausea and vomiting)    PONV after knee arthroplasty   Right ureteral stone    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   Scoliosis    sees chiropracter monthly   Wears glasses     Past Surgical History:  Procedure Laterality Date   BREAST SURGERY  1999   breast biopsy   BUNIONECTOMY  2000   [place]; 1985   COLONOSCOPY WITH PROPOFOL N/A 04/30/2015   Procedure: COLONOSCOPY WITH PROPOFOL;  Surgeon: [name], MD;  Location: [place];  Service: Endoscopy;  Laterality: N/A;   COLONOSCOPY WITH PROPOFOL N/A 05/03/2019   Procedure: COLONOSCOPY WITH PROPOFOL;  Surgeon: [name], MD;  Location: [facility] ENDOSCOPY;  Service: Endoscopy;  Laterality: N/A;  patient was COVID POSITIVE on 02/26/2019   CYSTOSCOPY WITH RETROGRADE PYELOGRAM, URETEROSCOPY AND STENT PLACEMENT Right 12/22/2019   Procedure: CYSTOSCOPY WITH RIGHT RETROGRADE PYELOGRAM, URETEROSCOPY WITH HOLMIUM LASER , BASKET EXTRACTION OF STONES AND STENT PLACEMENT;  Surgeon: Louis Meckel,  Viona Gilmore, MD;  Location[facility];  Service: Urology;  Laterality: Right;   DIAGNOSTIC LAPAROSCOPY  01/19/2017   LOA w/ Gastropexy/ EGD   EXTRACORPOREAL SHOCK WAVE LITHOTRIPSY Right 08/22/2019   Procedure: RIGHT EXTRACORPOREAL SHOCK WAVE LITHOTRIPSY (ESWL);  Surgeon: Ceasar Mons, MD;  Location: [facility];  Service: Urology;  Laterality: Right;   HYSTEROSCOPY WITH D & C  x2  last one 01/ 2017   KNEE ARTHROSCOPY Left 05/09/2015   Procedure: ARTHROSCOPY LEFT KNEE WITH SYNOVECTOMY;  Surgeon: [name], MD;  Location: [facility];  Service: Orthopedics;  Laterality: Left;   KNEE  ARTHROSCOPY Left 04/27/2017   Procedure: Left knee arthroscopy; synovectomy;  Surgeon: [name], MD;  Location: [facility];  Service: Orthopedics;  Laterality: Left;   KNEE ARTHROSCOPY Left 2009   LAPAROSCOPIC GASTRIC SLEEVE RESECTION  01/14/2017   POLYPECTOMY  04/30/2015   Procedure: POLYPECTOMY;  Surgeon: [name], MD;  Location: [place];  Service: Endoscopy;;   TONSILLECTOMY AND ADENOIDECTOMY  2007   TOTAL KNEE ARTHROPLASTY Right 04/27/2017   Procedure: RIGHT TOTAL KNEE ARTHROPLASTY;  Surgeon: [name], MD;  Location: [facility];  Service: Orthopedics;  Laterality: Right;   TOTAL KNEE ARTHROPLASTY Left 2014   TOTAL KNEE ARTHROPLASTY WITH REVISION COMPONENTS Left 04/16/2016   Procedure: LEFT TOTAL KNEE ARTHROPLASTY WITH POLY REVISION;  Surgeon: [name], MD;  Location: [facility];  Service: Orthopedics;  Laterality: Left;  with abductor block   TOTAL SHOULDER ARTHROPLASTY Right 10/16/2016   TOTAL SHOULDER ARTHROPLASTY Right 10/16/2016   Procedure: TOTAL SHOULDER ARTHROPLASTY;  Surgeon: [name], MD;  Location: [place];  Service: Orthopedics;  Laterality: Right;   TUBAL LIGATION  1986 & 1991   VEIN LIGATION AND STRIPPING  2000    Family History  Problem Relation Age of Onset   Arthritis Mother    Hypertension Mother    Arthritis Father    Cancer Father        prostate CA   Hypertension Father    Cancer Paternal Aunt        breast cancer   Arthritis Maternal Grandmother    Hypertension Maternal Grandmother    Arthritis Maternal Grandfather    Hypertension Maternal Grandfather    Diabetes Maternal Grandfather    Diabetes Brother    Breast cancer Neg Hx     Social History   Socioeconomic History   Marital status: Divorced    Spouse name: Not on file   Number of children: Not on file   Years of education: Not on file   Highest education level: Not on file  Occupational History   Not on file  Tobacco Use   Smoking status: Never   Smokeless tobacco:  Never  Vaping Use   Vaping Use: Never used  Substance and Sexual Activity   Alcohol use: Not Currently    Comment: rare   Drug use: Never   Sexual activity: Not on file  Other Topics Concern   Not on file  Social History Narrative   Right handed   Drinks caffeine   One story home   Social Determinants of Health   Financial Resource Strain: Low Risk  (06/10/2021)   Overall Financial Resource Strain (CARDIA)    Difficulty of Paying Living Expenses: Not very hard  Food Insecurity: No Food Insecurity (06/10/2021)   Hunger Vital Sign    Worried About Running Out of Food in the Last Year: Never true    Ran Out of  Food in the Last Year: Never true  Transportation Needs: No Transportation Needs (06/10/2021)   PRAPARE - Hydrologist (Medical): No    Lack of Transportation (Non-Medical): No  Physical Activity: Insufficiently Active (06/10/2021)   Exercise Vital Sign    Days of Exercise per Week: 5 days    Minutes of Exercise per Session: 20 min  Stress: Stress Concern Present (06/10/2021)   [place]    Feeling of Stress : To some extent  Social Connections: Moderately Integrated (06/10/2021)   Social Connection and Isolation Panel [NHANES]    Frequency of Communication with Friends and Family: Once a week    Frequency of Social Gatherings with Friends and Family: More than three times a week    Attends Religious Services: More than 4 times per year    Active Member of [employer] or Organizations: Yes    Attends Music therapist: More than 4 times per year    Marital Status: Divorced  Intimate Partner Violence: Not At Risk (06/10/2021)   Humiliation, Afraid, Rape, and Kick questionnaire    Fear of Current or Ex-Partner: No    Emotionally Abused: No    Physically Abused: No    Sexually Abused: No   Review of Systems No N/V Appetite off--but having a litte (fluids/fruit/oatmeal) Some  discharge from left eye--not red     Objective:   Physical Exam Constitutional:      Appearance: Normal appearance.  HENT:     Head:     Comments: Mild frontal tenderness    Right Ear: Tympanic membrane and ear canal normal.     Left Ear: Tympanic membrane and ear canal normal.     Nose: No congestion.     Mouth/Throat:     Pharynx: No oropharyngeal exudate or posterior oropharyngeal erythema.  Pulmonary:     Effort: Pulmonary effort is normal.     Breath sounds: Normal breath sounds. No wheezing or rales.  Musculoskeletal:     Cervical back: Neck supple.  Lymphadenopathy:     Cervical: No cervical adenopathy.  Neurological:     Mental Status: She is alert.            Assessment & Plan:

## 2021-12-18 DIAGNOSIS — G4733 Obstructive sleep apnea (adult) (pediatric): Secondary | ICD-10-CM | POA: Diagnosis not present

## 2022-04-03 ENCOUNTER — Other Ambulatory Visit: Payer: Self-pay | Admitting: Family Medicine

## 2022-04-03 DIAGNOSIS — Z0000 Encounter for general adult medical examination without abnormal findings: Secondary | ICD-10-CM

## 2022-04-04 ENCOUNTER — Other Ambulatory Visit: Payer: Self-pay | Admitting: Family Medicine

## 2022-04-04 DIAGNOSIS — Z1231 Encounter for screening mammogram for malignant neoplasm of breast: Secondary | ICD-10-CM

## 2022-04-04 NOTE — Telephone Encounter (Signed)
Last filled on 10/30/21 #100 tab/ 1 refill last CPE was 09/04/21

## 2022-04-21 ENCOUNTER — Encounter: Payer: Self-pay | Admitting: Family Medicine

## 2022-04-21 ENCOUNTER — Ambulatory Visit (INDEPENDENT_AMBULATORY_CARE_PROVIDER_SITE_OTHER): Payer: Medicare Other | Admitting: Family Medicine

## 2022-04-21 ENCOUNTER — Ambulatory Visit (INDEPENDENT_AMBULATORY_CARE_PROVIDER_SITE_OTHER)
Admission: RE | Admit: 2022-04-21 | Discharge: 2022-04-21 | Disposition: A | Discharge status: Home / Self Care | Payer: Medicare Other | Source: Ambulatory Visit | Attending: Family Medicine | Admitting: Family Medicine

## 2022-04-21 VITALS — BP 126/68 | HR 96 | Temp 102.1°F | Ht 65.5 in | Wt 230.2 lb

## 2022-04-21 DIAGNOSIS — R509 Fever, unspecified: Secondary | ICD-10-CM | POA: Diagnosis not present

## 2022-04-21 DIAGNOSIS — R21 Rash and other nonspecific skin eruption: Secondary | ICD-10-CM | POA: Diagnosis not present

## 2022-04-21 DIAGNOSIS — F411 Generalized anxiety disorder: Secondary | ICD-10-CM | POA: Diagnosis not present

## 2022-04-21 DIAGNOSIS — J069 Acute upper respiratory infection, unspecified: Secondary | ICD-10-CM

## 2022-04-21 DIAGNOSIS — F419 Anxiety disorder, unspecified: Secondary | ICD-10-CM | POA: Diagnosis not present

## 2022-04-21 DIAGNOSIS — F32A Depression, unspecified: Secondary | ICD-10-CM

## 2022-04-21 DIAGNOSIS — R059 Cough, unspecified: Secondary | ICD-10-CM | POA: Diagnosis not present

## 2022-04-21 DIAGNOSIS — R051 Acute cough: Secondary | ICD-10-CM | POA: Insufficient documentation

## 2022-04-21 DIAGNOSIS — R053 Chronic cough: Secondary | ICD-10-CM | POA: Insufficient documentation

## 2022-04-21 LAB — POCT INFLUENZA A/B
Influenza A, POC: NEGATIVE
Influenza B, POC: NEGATIVE

## 2022-04-21 LAB — POC COVID19 BINAXNOW: SARS Coronavirus 2 Ag: NEGATIVE

## 2022-04-21 MED ORDER — ALPRAZOLAM 0.5 MG PO TABS: 0.5000 mg | ORAL_TABLET | Freq: Every day | ORAL | 0 refills | Status: AC | PRN

## 2022-04-21 MED ORDER — ALBUTEROL SULFATE HFA 108 (90 BASE) MCG/ACT IN AERS: 2.0000 | INHALATION_SPRAY | RESPIRATORY_TRACT | 0 refills | Status: DC | PRN

## 2022-04-21 MED ORDER — DOXYCYCLINE HYCLATE 100 MG PO TABS: 100.0000 mg | ORAL_TABLET | Freq: Two times a day (BID) | ORAL | 0 refills | Status: DC

## 2022-04-21 MED ORDER — PREDNISONE 10 MG PO TABS: ORAL_TABLET | ORAL | 0 refills | Status: DC

## 2022-04-21 NOTE — Assessment & Plan Note (Signed)
Pt has some scattered excoriated papules on back (this is only area)  She thinks from sweating/fever / lying in bed   Recommend keeping clean/dry  Update if not starting to improve in a week or if worsening

## 2022-04-21 NOTE — Patient Instructions (Signed)
Take predniosne as directed for wheezing Take antibiotic (doxycycline)  Chest xray now- we will call you with result   Drink fluids and rest  mucinex DM is good for cough and congestion  Nasal saline for congestion as needed  Tylenol for fever or pain or headache  Please alert [place] if symptoms worsen (if severe or short of breath please go to the ER)

## 2022-04-21 NOTE — Assessment & Plan Note (Signed)
Worse recently with grief  To start grief group tx (but has bad uri and had to put off) Good support  Refilled alprazolam (has not used in years) with caution of sedation and habit  Enc starting with group when well enough

## 2022-04-21 NOTE — Assessment & Plan Note (Signed)
Suspect acute bronchitis Flu and covid tests neg despite fever Cxr reassuring -no infiltrate   Px doxycycline Prednisone taper 40 mg - disc poss side eff, also albuterol mdi Fluids/rest sympt care-see [facility] ER precautions noted

## 2022-04-21 NOTE — Progress Notes (Signed)
Subjective:    Patient ID: Katrina Huffman, female    DOB: Jun 23, 1955, 67 y.o.   MRN: [id number]  HPI Pt presents for cough/congestion and fever Also rash on back   Needs xanax refill Worse anxiety with recent grief    Wt Readings from Last 3 Encounters:  04/21/22 230 lb 4 oz (104.4 kg)  12/12/21 237 lb (107.5 kg)  11/21/21 244 lb 12.8 oz (111 kg)   37.73 kg/m  Vitals:   04/21/22 1151  BP: 126/68  Pulse: 96  Temp: (!) 102.1 F (38.9 C)  SpO2: 99%   Started getting sick on Thursday night  Had chills Fever - at home 101.8   Coughing - notes phlegm but does not look at color  Congestion  Some wheezing  ? Sob when moving around   Throat is scratchy  Ears -fine     Otc Tylenol    Anxiety  Just lost her best friend  Grief sharing- could not start last night since she was sick  CXR today DG Chest 2 View  Result Date: 04/21/2022 CLINICAL DATA:  Cough with fever EXAM: CHEST - 2 VIEW COMPARISON:  None Available. FINDINGS: Cardiac and mediastinal contours are within normal limits when accounting for patient's scoliosis. Both lungs are clear. Scoliotic curvature of the thoracic spine. No pleural effusion or pneumothorax. IMPRESSION: No active cardiopulmonary disease. Electronically Signed   By: Yetta Glassman M.D.   On: 04/21/2022 12:57    Results for orders placed or performed in visit on 04/21/22  POC COVID-19  Result Value Ref Range   SARS Coronavirus 2 Ag Negative Negative  POCT Influenza A/B  Result Value Ref Range   Influenza A, POC Negative Negative   Influenza B, POC Negative Negative     Patient Active Problem List   Diagnosis Date Noted   URI with cough and congestion 04/21/2022   OSA on CPAP 06/10/2021   Prediabetes 06/10/2021   Scoliosis 06/10/2021   Facial paresthesia 12/06/2019   Personal history of colonic polyps    Endometrial hyperplasia 04/26/2019   Welcome to Medicare preventive visit 04/26/2019   OA (osteoarthritis) of knee  04/27/2017   Bariatric surgery status 03/06/2017   S/P shoulder replacement 10/16/2016   Esophagitis 10/02/2016   Failed total knee arthroplasty, 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   Failed total knee arthroplasty ([place]) 04/16/2016   Bilateral chronic knee pain 03/20/2016   Vitamin D deficiency 03/05/2016   Anxiety disorder 03/05/2016   Essential hypertension 03/05/2016   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   Special screening for malignant neoplasms, colon    Benign neoplasm of transverse colon    Postmenopausal bleeding 12/16/2014   Screening for lipoid disorders 06/18/2010   Routine general medical examination at a health care facility 06/18/2010   Morbid obesity ([place]) 06/18/2010   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   History of 2019 novel coronavirus disease (COVID-19) 02/28/2019   positive results in care everywhere;  12-15-2019  per pt mild symptoms resolved in just over 2 wks with no residual   History of hypertension    per pt hx dx HTN prior to gastric bypass 12/ 2018,  since lost  wt no issue   OSA (obstructive sleep apnea)    12-15-2019 per pt has not used cpap since 01/ 2021 stated does not need   PONV (postoperative nausea and vomiting)    PONV after knee arthroplasty   Right ureteral stone    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   Scoliosis    sees chiropracter monthly   Wears glasses    Past Surgical History:  Procedure Laterality Date   BREAST SURGERY  1999   breast biopsy   BUNIONECTOMY  2000   [place]; 1985   COLONOSCOPY WITH PROPOFOL N/A 04/30/2015   Procedure: COLONOSCOPY WITH PROPOFOL;  Surgeon: [name], MD;  Location: [place];  Service: Endoscopy;   Laterality: N/A;   COLONOSCOPY WITH PROPOFOL N/A 05/03/2019   Procedure: COLONOSCOPY WITH PROPOFOL;  Surgeon: [name], MD;  Location: [facility] ENDOSCOPY;  Service: Endoscopy;  Laterality: N/A;  patient was COVID POSITIVE on 02/26/2019   CYSTOSCOPY WITH RETROGRADE PYELOGRAM, URETEROSCOPY AND STENT PLACEMENT Right 12/22/2019   Procedure: CYSTOSCOPY WITH RIGHT RETROGRADE PYELOGRAM, URETEROSCOPY WITH HOLMIUM LASER , BASKET EXTRACTION OF STONES AND STENT PLACEMENT;  Surgeon: Ardis Hughs, MD;  Location: [facility];  Service: Urology;  Laterality: Right;   DIAGNOSTIC LAPAROSCOPY  01/19/2017   LOA w/ Gastropexy/ EGD   EXTRACORPOREAL SHOCK WAVE LITHOTRIPSY Right 08/22/2019   Procedure: RIGHT EXTRACORPOREAL SHOCK WAVE LITHOTRIPSY (ESWL);  Surgeon: Ceasar Mons, MD;  Location: [facility];  Service: Urology;  Laterality: Right;   HYSTEROSCOPY WITH D & C  x2  last one 01/ 2017   KNEE ARTHROSCOPY Left 05/09/2015   Procedure: ARTHROSCOPY LEFT KNEE WITH SYNOVECTOMY;  Surgeon: [name], MD;  Location: [facility];  Service: Orthopedics;  Laterality: Left;   KNEE ARTHROSCOPY Left 04/27/2017   Procedure: Left knee arthroscopy; synovectomy;  Surgeon: [name], MD;  Location: [facility];  Service: Orthopedics;  Laterality: Left;   KNEE ARTHROSCOPY Left 2009   LAPAROSCOPIC GASTRIC SLEEVE RESECTION  01/14/2017   POLYPECTOMY  04/30/2015   Procedure: POLYPECTOMY;  Surgeon: [name], MD;  Location: [place];  Service: Endoscopy;;   TONSILLECTOMY AND ADENOIDECTOMY  2007   TOTAL KNEE ARTHROPLASTY Right 04/27/2017   Procedure: RIGHT TOTAL KNEE ARTHROPLASTY;  Surgeon: [name], MD;  Location: [facility];  Service: Orthopedics;  Laterality: Right;   TOTAL KNEE ARTHROPLASTY Left 2014   TOTAL KNEE ARTHROPLASTY WITH REVISION COMPONENTS Left 04/16/2016   Procedure: LEFT TOTAL KNEE ARTHROPLASTY WITH POLY REVISION;  Surgeon: [name], MD;  Location: [facility];   Service: Orthopedics;  Laterality: Left;  with abductor block   TOTAL SHOULDER ARTHROPLASTY Right 10/16/2016   TOTAL SHOULDER ARTHROPLASTY Right 10/16/2016   Procedure: TOTAL SHOULDER ARTHROPLASTY;  Surgeon: [name], MD;  Location: [place];  Service: Orthopedics;  Laterality: Right;   [place] AND STRIPPING  2000   Social History   Tobacco Use   Smoking status: Never   Smokeless tobacco: Never  Vaping Use   Vaping Use: Never used  Substance Use Topics   Alcohol use: Not Currently    Comment: rare   Drug use: Never   Family History  Problem Relation Age of Onset   Arthritis Mother    Hypertension Mother    Arthritis Father    Cancer Father        prostate CA   Hypertension Father    Cancer Paternal Aunt  breast cancer   Arthritis Maternal Grandmother    Hypertension Maternal Grandmother    Arthritis Maternal Grandfather    Hypertension Maternal Grandfather    Diabetes Maternal Grandfather    Diabetes Brother    Breast cancer Neg Hx    No Known Allergies Current Outpatient Medications on File Prior to Visit  Medication Sig Dispense Refill   Calcium 500-100 MG-UNIT CHEW Chew by mouth.     Cholecalciferol (VITAMIN D) 50 MCG (2000 UT) tablet Take 4,000 Units by mouth daily.     citalopram (CELEXA) 20 MG tablet Take 1 tablet (20 mg total) by mouth daily. 90 tablet 3   Cranberry 400 MG CAPS Take by mouth daily.      gabapentin (NEURONTIN) 300 MG capsule TAKE 3 CAPSULES BY MOUTH 3  TIMES DAILY 810 capsule 1   meloxicam (MOBIC) 15 MG tablet TAKE 1 TABLET BY MOUTH DAILY  WITH FOOD AS NEEDED FOR PAIN 100 tablet 1   Multiple Vitamins-Minerals (BARIATRIC MULTIVITAMINS/IRON) CAPS Take by mouth daily.     No current facility-administered medications on file prior to visit.    Review of Systems  Constitutional:  Positive for appetite change and fatigue. Negative for fever.  HENT:  Positive for congestion, postnasal drip, rhinorrhea,  sinus pressure, sneezing and sore throat. Negative for ear pain.   Eyes:  Negative for pain and discharge.  Respiratory:  Positive for cough and wheezing. Negative for shortness of breath and stridor.   Cardiovascular:  Negative for chest pain.  Gastrointestinal:  Negative for diarrhea, nausea and vomiting.  Genitourinary:  Negative for frequency, hematuria and urgency.  Musculoskeletal:  Negative for arthralgias and myalgias.  Skin:  Negative for rash.  Neurological:  Positive for headaches. Negative for dizziness, weakness and light-headedness.  Psychiatric/Behavioral:  Negative for confusion and dysphoric mood.        Objective:   Physical Exam Constitutional:      General: She is not in acute distress.    Appearance: Normal appearance. She is well-developed. She is obese. She is not ill-appearing, toxic-appearing or diaphoretic.  HENT:     Head: Normocephalic and atraumatic.     Comments: Nares are injected and congested    No sinus tenderness    Right Ear: Tympanic membrane, ear canal and external ear normal.     Left Ear: Tympanic membrane, ear canal and external ear normal.     Nose: Congestion and rhinorrhea present.     Mouth/Throat:     Mouth: Mucous membranes are moist.     Pharynx: Oropharynx is clear. No oropharyngeal exudate or posterior oropharyngeal erythema.     Comments: Clear pnd  Eyes:     General:        Right eye: No discharge.        Left eye: No discharge.     Conjunctiva/sclera: Conjunctivae normal.     Pupils: Pupils are equal, round, and reactive to light.  Cardiovascular:     Rate and Rhythm: Normal rate.     Heart sounds: Normal heart sounds.  Pulmonary:     Effort: Pulmonary effort is normal. No respiratory distress.     Breath sounds: No stridor. Wheezing and rhonchi present. No rales.     Comments: Diffuse rhonchi Exp wheezes worse at bases No rales  Chest:     Chest wall: No tenderness.  Musculoskeletal:     Cervical back: Normal range  of motion and neck supple.  Lymphadenopathy:     Cervical: No cervical  adenopathy.  Skin:    General: Skin is warm and dry.     Capillary Refill: Capillary refill takes less than 2 seconds.     Findings: No rash.     Comments: Some partially excoriated papules on back  No scale  No erythema    No other rash areas  Neurological:     Mental Status: She is alert.     Cranial Nerves: No cranial nerve deficit.  Psychiatric:        Mood and Affect: Mood normal.           Assessment & Plan:   Problem List Items Addressed This Visit       Respiratory   URI with cough and congestion - Primary    Suspect acute bronchitis Flu and covid tests neg despite fever Cxr reassuring -no infiltrate   Px doxycycline Prednisone taper 40 mg - disc poss side eff, also albuterol mdi Fluids/rest sympt care-see AVS ER precautions noted       Relevant Orders   DG Chest 2 View (Completed)     Musculoskeletal and Integument   Rash of back    Pt has some scattered excoriated papules on back (this is only area)  She thinks from sweating/fever / lying in bed   Recommend keeping clean/dry  Update if not starting to improve in a week or if worsening          Other   Anxiety disorder    Worse recently with grief  To start grief group tx (but has bad uri and had to put off) Good support  Refilled alprazolam (has not used in years) with caution of sedation and habit  Enc starting with group when well enough       Relevant Medications   ALPRAZolam (XANAX) 0.5 MG tablet   Other Visit Diagnoses     Fever, unspecified fever cause       Relevant Orders   POC COVID-19 (Completed)   POCT Influenza A/B (Completed)   Anxiety and depression       Relevant Medications   ALPRAZolam (XANAX) 0.5 MG tablet

## 2022-05-13 ENCOUNTER — Ambulatory Visit
Admission: RE | Admit: 2022-05-13 | Discharge: 2022-05-13 | Disposition: A | Discharge status: Home / Self Care | Payer: Medicare Other | Source: Ambulatory Visit | Attending: Family Medicine | Admitting: Family Medicine

## 2022-05-13 DIAGNOSIS — Z1231 Encounter for screening mammogram for malignant neoplasm of breast: Secondary | ICD-10-CM

## 2022-06-25 ENCOUNTER — Ambulatory Visit (INDEPENDENT_AMBULATORY_CARE_PROVIDER_SITE_OTHER): Payer: Medicare Other

## 2022-06-25 VITALS — Ht 65.0 in | Wt 234.0 lb

## 2022-06-25 DIAGNOSIS — Z780 Asymptomatic menopausal state: Secondary | ICD-10-CM

## 2022-06-25 DIAGNOSIS — Z0000 Encounter for general adult medical examination without abnormal findings: Secondary | ICD-10-CM | POA: Diagnosis not present

## 2022-06-25 NOTE — Progress Notes (Signed)
I connected with  DREW MITRE on 06/25/22 by a audio enabled telemedicine application and verified that I am speaking with the correct person using two identifiers.  Patient Location: Other:  [facility]  Provider Location: Home Office  I discussed the limitations of evaluation and management by telemedicine. The patient expressed understanding and agreed to proceed.  Subjective:   Katrina Huffman is a 67 y.o. female who presents for Medicare Annual (Subsequent) preventive examination.  Review of Systems      Cardiac Risk Factors include: advanced age (>56men, >83 women);hypertension;sedentary lifestyle     Objective:    Today's Vitals   06/25/22 1440  Weight: 234 lb (106.1 kg)  Height: 5\' 5"  (1.651 m)  PainSc: 3    Body mass index is 38.94 kg/m.     06/25/2022    2:51 PM 06/10/2021    3:43 PM 04/02/2021   12:50 PM 02/07/2020    1:42 PM 12/22/2019    6:01 AM 08/22/2019    6:44 AM 05/03/2019    9:11 AM  Advanced Directives  Does Patient Have a Medical Advance Directive? Yes Yes Yes No No No No  Type of Estate agent of [place];Living will Living will Healthcare Power of [place];Living will      Copy of Healthcare Power of Attorney in Chart? No - copy requested        Would patient like information on creating a medical advance directive?  No - Patient declined   Yes (MAU/Ambulatory/Procedural Areas - Information given) No - Patient declined     Current Medications (verified) Outpatient Encounter Medications as of 06/25/2022  Medication Sig   ALPRAZolam (XANAX) 0.5 MG tablet Take 1 tablet (0.5 mg total) by mouth daily as needed for anxiety.   Calcium 500-100 MG-UNIT CHEW Chew by mouth.   Cholecalciferol (VITAMIN D) 50 MCG (2000 UT) tablet Take 4,000 Units by mouth daily.   citalopram (CELEXA) 20 MG tablet Take 1 tablet (20 mg total) by mouth daily.   Cranberry 400 MG CAPS Take by mouth daily.    gabapentin (NEURONTIN) 300 MG capsule TAKE 3 CAPSULES  BY MOUTH 3  TIMES DAILY   meloxicam (MOBIC) 15 MG tablet TAKE 1 TABLET BY MOUTH DAILY  WITH FOOD AS NEEDED FOR PAIN   Multiple Vitamins-Minerals (BARIATRIC MULTIVITAMINS/IRON) CAPS Take by mouth daily.   albuterol (VENTOLIN HFA) 108 (90 Base) MCG/ACT inhaler Inhale 2 puffs into the lungs every 4 (four) hours as needed for wheezing. (Patient not taking: Reported on 06/25/2022)   doxycycline (VIBRA-TABS) 100 MG tablet Take 1 tablet (100 mg total) by mouth 2 (two) times daily. (Patient not taking: Reported on 06/25/2022)   predniSONE (DELTASONE) 10 MG tablet Take 4 pills once daily by mouth for 3 days, then 3 pills daily for 3 days, then 2 pills daily for 3 days then 1 pill daily for 3 days then stop (Patient not taking: Reported on 06/25/2022)   No facility-administered encounter medications on file as of 06/25/2022.    Allergies (verified) Patient has no known allergies.   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   History of 2019 novel coronavirus disease (COVID-19) 02/28/2019  positive results in care everywhere;  12-15-2019  per pt mild symptoms resolved in just over 2 wks with no residual   History of hypertension    per pt hx dx HTN prior to gastric bypass 12/ 2018,  since lost wt no issue   OSA (obstructive sleep apnea)    12-15-2019 per pt has not used cpap since 01/ 2021 stated does not need   PONV (postoperative nausea and vomiting)    PONV after knee arthroplasty   Right ureteral stone    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   Scoliosis    sees chiropracter monthly   Wears glasses    Past Surgical History:  Procedure Laterality Date   BREAST SURGERY  1999   breast biopsy   BUNIONECTOMY  2000   CESAREAN SECTION   1983; 1985   COLONOSCOPY WITH PROPOFOL N/A 04/30/2015   Procedure: COLONOSCOPY WITH PROPOFOL;  Surgeon: Midge Minium, MD;  Location[facility];  Service: Endoscopy;  Laterality: N/A;   COLONOSCOPY WITH PROPOFOL N/A 05/03/2019   Procedure: COLONOSCOPY WITH PROPOFOL;  Surgeon: Midge Minium, MD;  Location: [facility];  Service: Endoscopy;  Laterality: N/A;  patient was COVID POSITIVE on 02/26/2019   CYSTOSCOPY WITH RETROGRADE PYELOGRAM, URETEROSCOPY AND STENT PLACEMENT Right 12/22/2019   Procedure: CYSTOSCOPY WITH RIGHT RETROGRADE PYELOGRAM, URETEROSCOPY WITH HOLMIUM LASER , BASKET EXTRACTION OF STONES AND STENT PLACEMENT;  Surgeon: Crist Fat, MD;  Location: [facility];  Service: Urology;  Laterality: Right;   DIAGNOSTIC LAPAROSCOPY  01/19/2017   LOA w/ Gastropexy/ EGD   EXTRACORPOREAL SHOCK WAVE LITHOTRIPSY Right 08/22/2019   Procedure: RIGHT EXTRACORPOREAL SHOCK WAVE LITHOTRIPSY (ESWL);  Surgeon: Rene Paci, MD;  Location[facility];  Service: Urology;  Laterality: Right;   HYSTEROSCOPY WITH D & C  x2  last one 01/ 2017   KNEE ARTHROSCOPY Left 05/09/2015   Procedure: ARTHROSCOPY LEFT KNEE WITH SYNOVECTOMY;  Surgeon: Ollen Gross, MD;  Location: [facility];  Service: Orthopedics;  Laterality: Left;   KNEE ARTHROSCOPY Left 04/27/2017   Procedure: Left knee arthroscopy; synovectomy;  Surgeon: Ollen Gross, MD;  Location: [facility];  Service: Orthopedics;  Laterality: Left;   KNEE ARTHROSCOPY Left 2009   LAPAROSCOPIC GASTRIC SLEEVE RESECTION  01/14/2017   POLYPECTOMY  04/30/2015   Procedure: POLYPECTOMY;  Surgeon: Midge Minium, MD;  Location: [facility];  Service: Endoscopy;;   TONSILLECTOMY AND ADENOIDECTOMY  2007   TOTAL KNEE ARTHROPLASTY Right 04/27/2017   Procedure: RIGHT TOTAL KNEE ARTHROPLASTY;  Surgeon: Ollen Gross, MD;  Location: [facility];  Service: Orthopedics;  Laterality: Right;   TOTAL KNEE ARTHROPLASTY Left 2014    TOTAL KNEE ARTHROPLASTY WITH REVISION COMPONENTS Left 04/16/2016   Procedure: LEFT TOTAL KNEE ARTHROPLASTY WITH POLY REVISION;  Surgeon: Ollen Gross, MD;  Location: [facility];  Service: Orthopedics;  Laterality: Left;  with abductor block   TOTAL SHOULDER ARTHROPLASTY Right 10/16/2016   TOTAL SHOULDER ARTHROPLASTY Right 10/16/2016   Procedure: TOTAL SHOULDER ARTHROPLASTY;  Surgeon: Francena Hanly, MD;  Location: [facility];  Service: Orthopedics;  Laterality: Right;   TUBAL LIGATION  1986 & 1991   VEIN LIGATION AND STRIPPING  2000   Family History  Problem Relation Age of Onset   Arthritis Mother    Hypertension Mother    Arthritis Father    Cancer Father        prostate CA   Hypertension Father    Breast cancer Paternal Aunt    Cancer Paternal Aunt  breast cancer   Arthritis Maternal Grandmother    Hypertension Maternal Grandmother    Arthritis Maternal Grandfather    Hypertension Maternal Grandfather    Diabetes Maternal Grandfather    Diabetes Brother    Social History   Socioeconomic History   Marital status: Divorced    Spouse name: Not on file   Number of children: Not on file   Years of education: Not on file   Highest education level: Not on file  Occupational History   Not on file  Tobacco Use   Smoking status: Never   Smokeless tobacco: Never  Vaping Use   Vaping Use: Never used  Substance and Sexual Activity   Alcohol use: Not Currently    Comment: rare   Drug use: Never   Sexual activity: Not on file  Other Topics Concern   Not on file  Social History Narrative   Right handed   Drinks caffeine   One story home   Social Determinants of Health   Financial Resource Strain: Low Risk  (06/25/2022)   Overall Financial Resource Strain (CARDIA)    Difficulty of Paying Living Expenses: Not hard at all  Food Insecurity: No Food Insecurity (06/25/2022)   Hunger Vital Sign    Worried About Running Out of Food in the Last Year: Never true    Ran Out of Food in  the Last Year: Never true  Transportation Needs: No Transportation Needs (06/25/2022)   PRAPARE - Administrator, Civil Service (Medical): No    Lack of Transportation (Non-Medical): No  Physical Activity: Insufficiently Active (06/25/2022)   Exercise Vital Sign    Days of Exercise per Week: 2 days    Minutes of Exercise per Session: 30 min  Stress: No Stress Concern Present (06/25/2022)   [employer] of Occupational Health - Occupational Stress Questionnaire    Feeling of Stress : Not at all  Social Connections: Socially Integrated (06/25/2022)   Social Connection and Isolation Panel [NHANES]    Frequency of Communication with Friends and Family: More than three times a week    Frequency of Social Gatherings with Friends and Family: More than three times a week    Attends Religious Services: More than 4 times per year    Active Member of [employer] or Organizations: Yes    Attends Engineer, structural: More than 4 times per year    Marital Status: Married    Tobacco Counseling Counseling given: Not Answered   Clinical Intake:  Pre-visit preparation completed: Yes  Pain : 0-10 Pain Score: 3  Pain Type: Chronic pain Pain Location: Generalized Pain Descriptors / Indicators: Aching, Sharp Pain Onset:  (years) Pain Frequency:  (years)     Nutritional Risks: None Diabetes: No  How often do you need to have someone help you when you read instructions, pamphlets, or other written materials from your doctor or pharmacy?: 1 - Never  Diabetic?no  Interpreter Needed?: No  Information entered by :: C.Maleigh Bagot LPN   Activities of Daily Living    06/25/2022    2:52 PM 06/23/2022   10:34 AM  In your present state of health, do you have any difficulty performing the following activities:  Hearing? 0 0  Vision? 0 0  Difficulty concentrating or making decisions? 1 0  Comment occasionally forgets   Walking or climbing stairs? 0 0  Dressing or bathing? 0 0   Doing errands, shopping? 0 0  Preparing Food and eating ? N N  Using the Toilet? N N  In the past six months, have you accidently leaked urine? N N  Do you have problems with loss of bowel control? N N  Managing your Medications? N N  Managing your Finances? N N  Housekeeping or managing your Housekeeping? N N    Patient Care Team: Tower, Audrie Gallus, MD as PCP - General (Family Medicine)  Indicate any recent Medical Services you may have received from other than [facility] providers in the past year (date may be approximate).     Assessment:   This is a routine wellness examination for [place].  Hearing/Vision screen Hearing Screening - Comments:: No aids Vision Screening - Comments:: Glasses  Dietary issues and exercise activities discussed: Current Exercise Habits: Home exercise routine, Type of exercise: walking, Time (Minutes): 30, Frequency (Times/Week): 2, Weekly Exercise (Minutes/Week): 60, Intensity: Mild, Exercise limited by: None identified   Goals Addressed             This Visit's Progress    Patient Stated       Lose 30 pounds.       Depression Screen    06/25/2022    2:47 PM 04/21/2022   12:09 PM 09/30/2021    3:13 PM 09/04/2021    2:45 PM 06/10/2021    3:36 PM 05/07/2020    3:02 PM 08/31/2019   10:35 AM  PHQ 2/9 Scores  PHQ - 2 Score 0 1 0 0 0 0 0  PHQ- 9 Score  8     0    Fall Risk    06/25/2022    2:52 PM 06/23/2022   10:34 AM 04/21/2022   12:09 PM 11/21/2021    2:19 PM 09/30/2021    3:13 PM  Fall Risk   Falls in the past year? 0 0 0 1 0  Number falls in past yr: 0 0 0 0 0  Injury with Fall? 0 0 0 1 0  Risk for fall due to : No Fall Risks  No Fall Risks No Fall Risks No Fall Risks  Follow up Falls prevention discussed;Falls evaluation completed  Falls evaluation completed Falls evaluation completed Falls evaluation completed    FALL RISK PREVENTION PERTAINING TO THE HOME:  Any stairs in or around the home? No  If so, are there any without handrails? No   Home free of loose throw rugs in walkways, pet beds, electrical cords, etc? Yes  Adequate lighting in your home to reduce risk of falls? Yes   ASSISTIVE DEVICES UTILIZED TO PREVENT FALLS:  Life alert? No  Use of a cane, walker or w/c? No  Grab bars in the bathroom? No  Shower chair or bench in shower? Yes  Elevated toilet seat or a handicapped toilet? Yes   Cognitive Function:        06/25/2022    2:54 PM 06/10/2021    3:48 PM  6CIT Screen  What Year? 0 points 0 points  What month? 0 points 0 points  What time? 0 points 0 points  Count back from 20 0 points 0 points  Months in reverse 0 points 0 points  Repeat phrase 0 points 0 points  Total Score 0 points 0 points    Immunizations Immunization History  Administered Date(s) Administered   Fluad Quad(high Dose 65+) 12/06/2019   Influenza,inj,Quad PF,6+ Mos 03/05/2016, 11/27/2016, 12/08/2017, 10/29/2020   Influenza-Unspecified 11/29/2016, 12/04/2017   PFIZER(Purple Top)SARS-COV-2 Vaccination 06/10/2019, 07/05/2019, 01/19/2020   Pneumococcal Conjugate-13 04/26/2019   Pneumococcal Polysaccharide-23 05/07/2020  Tdap 03/06/2017   Zoster Recombinat (Shingrix) 06/11/2016    TDAP status: Up to date  Flu Vaccine status: Up to date  Pneumococcal vaccine status: Up to date  Covid-19 vaccine status: Information provided on how to obtain vaccines.   Qualifies for Shingles Vaccine? Yes   Zostavax completed No   Shingrix Completed?: Yes  Screening Tests Health Maintenance  Topic Date Due   Zoster Vaccines- Shingrix (2 of 2) 08/06/2016   COVID-19 Vaccine (4 - 2023-24 season) 10/04/2021   Hepatitis C Screening  03/08/2023 (Originally 02/02/1974)   INFLUENZA VACCINE  09/04/2022   Medicare Annual Wellness (AWV)  06/25/2023   COLONOSCOPY (Pts 45-54yrs Insurance coverage will need to be confirmed)  05/02/2024   MAMMOGRAM  05/12/2024   DTaP/Tdap/Td (2 - Td or Tdap) 03/07/2027   Pneumonia Vaccine 64+ Years old  Completed    DEXA SCAN  Completed   HPV VACCINES  Aged Out    Health Maintenance  Health Maintenance Due  Topic Date Due   Zoster Vaccines- Shingrix (2 of 2) 08/06/2016   COVID-19 Vaccine (4 - 2023-24 season) 10/04/2021    Colorectal cancer screening: Type of screening: Colonoscopy. Completed 05/03/19. Repeat every 5 years  Mammogram status: Completed 05/13/22. Repeat every year  Bone Density status: Completed 09/02/17. Results reflect: Bone density results: NORMAL. Repeat every 5 years. Order placed  Lung Cancer Screening: (Low Dose CT Chest recommended if Age 9-80 years, 30 pack-year currently smoking OR have quit w/in 15years.) does not qualify.   Lung Cancer Screening Referral: no  Additional Screening:  Hepatitis C Screening: does qualify; Completed DUE AT NEXT VISIT  Vision Screening: Recommended annual ophthalmology exams for early detection of glaucoma and other disorders of the eye. Is the patient up to date with their annual eye exam?  Yes  Who is the provider or what is the name of the office in which the patient attends annual eye exams? [facility] If pt is not established with a provider, would they like to be referred to a provider to establish care? Yes .   Dental Screening: Recommended annual dental exams for proper oral hygiene  Community Resource Referral / Chronic Care Management: CRR required this visit?  No   CCM required this visit?  No      Plan:     I have personally reviewed and noted the following in the patient's chart:   Medical and social history Use of alcohol, tobacco or illicit drugs  Current medications and supplements including opioid prescriptions. Patient is not currently taking opioid prescriptions. Functional ability and status Nutritional status Physical activity Advanced directives List of other physicians Hospitalizations, surgeries, and ER visits in previous 12 months Vitals Screenings to include cognitive, depression, and  falls Referrals and appointments  In addition, I have reviewed and discussed with patient certain preventive protocols, quality metrics, and best practice recommendations. A written personalized care plan for preventive services as well as general preventive health recommendations were provided to patient.     Maryan Puls, LPN   1/61/0960   Nurse Notes: Order Placed for Dexa Scan.

## 2022-06-25 NOTE — Patient Instructions (Signed)
Katrina Huffman , Thank you for taking time to come for your Medicare Wellness Visit. I appreciate your ongoing commitment to your health goals. Please review the following plan we discussed and let me know if I can assist you in the future.   These are the goals we discussed:  Goals      Exercise 3x per week (30 min per time)     Increase exercise as tolerated. Try to work until age 67 and then retire.       Patient Stated     Lose 30 pounds.        This is a list of the screening recommended for you and due dates:  Health Maintenance  Topic Date Due   Zoster (Shingles) Vaccine (2 of 2) 08/06/2016   COVID-19 Vaccine (4 - 2023-24 season) 10/04/2021   Hepatitis C Screening: USPSTF Recommendation to screen - Ages 18-79 yo.  03/08/2023*   Flu Shot  09/04/2022   Medicare Annual Wellness Visit  06/25/2023   Colon Cancer Screening  05/02/2024   Mammogram  05/12/2024   DTaP/Tdap/Td vaccine (2 - Td or Tdap) 03/07/2027   Pneumonia Vaccine  Completed   DEXA scan (bone density measurement)  Completed   HPV Vaccine  Aged Out  *Topic was postponed. The date shown is not the original due date.    Advanced directives: Please bring a copy of your health care power of attorney and living will to the office to be added to your chart at your convenience.   Conditions/risks identified: Aim for 30 minutes of exercise or brisk walking, 6-8 glasses of water, and 5 servings of fruits and vegetables each day.   Next appointment: Follow up in one year for your annual wellness visit 06/30/23 @ 3:00 telephone   Preventive Care 65 Years and Older, Female Preventive care refers to lifestyle choices and visits with your health care provider that can promote health and wellness. What does preventive care include? A yearly physical exam. This is also called an annual well check. Dental exams once or twice a year. Routine eye exams. Ask your health care provider how often you should have your eyes  checked. Personal lifestyle choices, including: Daily care of your teeth and gums. Regular physical activity. Eating a healthy diet. Avoiding tobacco and drug use. Limiting alcohol use. Practicing safe sex. Taking low-dose aspirin every day. Taking vitamin and mineral supplements as recommended by your health care provider. What happens during an annual well check? The services and screenings done by your health care provider during your annual well check will depend on your age, overall health, lifestyle risk factors, and family history of disease. Counseling  Your health care provider may ask you questions about your: Alcohol use. Tobacco use. Drug use. Emotional well-being. Home and relationship well-being. Sexual activity. Eating habits. History of falls. Memory and ability to understand (cognition). Work and work Astronomer. Reproductive health. Screening  You may have the following tests or measurements: Height, weight, and BMI. Blood pressure. Lipid and cholesterol levels. These may be checked every 5 years, or more frequently if you are over 50 years old. Skin check. Lung cancer screening. You may have this screening every year starting at age 75 if you have a 30-pack-year history of smoking and currently smoke or have quit within the past 15 years. Fecal occult blood test (FOBT) of the stool. You may have this test every year starting at age 5. Flexible sigmoidoscopy or colonoscopy. You may have a sigmoidoscopy every  5 years or a colonoscopy every 10 years starting at age 21. Hepatitis C blood test. Hepatitis B blood test. Sexually transmitted disease (STD) testing. Diabetes screening. This is done by checking your blood sugar (glucose) after you have not eaten for a while (fasting). You may have this done every 1-3 years. Bone density scan. This is done to screen for osteoporosis. You may have this done starting at age 58. Mammogram. This may be done every 1-2  years. Talk to your health care provider about how often you should have regular mammograms. Talk with your health care provider about your test results, treatment options, and if necessary, the need for more tests. Vaccines  Your health care provider may recommend certain vaccines, such as: Influenza vaccine. This is recommended every year. Tetanus, diphtheria, and acellular pertussis (Tdap, Td) vaccine. You may need a Td booster every 10 years. Zoster vaccine. You may need this after age 16. Pneumococcal 13-valent conjugate (PCV13) vaccine. One dose is recommended after age 80. Pneumococcal polysaccharide (PPSV23) vaccine. One dose is recommended after age 62. Talk to your health care provider about which screenings and vaccines you need and how often you need them. This information is not intended to replace advice given to you by your health care provider. Make sure you discuss any questions you have with your health care provider. Document Released: 02/16/2015 Document Revised: 10/10/2015 Document Reviewed: 11/21/2014 Elsevier Interactive Patient Education  2017 [employer].  Fall Prevention in the Home Falls can cause injuries. They can happen to people of all ages. There are many things you can do to make your home safe and to help prevent falls. What can I do on the outside of my home? Regularly fix the edges of walkways and driveways and fix any cracks. Remove anything that might make you trip as you walk through a door, such as a raised step or threshold. Trim any bushes or trees on the path to your home. Use bright outdoor lighting. Clear any walking paths of anything that might make someone trip, such as rocks or tools. Regularly check to see if handrails are loose or broken. Make sure that both sides of any steps have handrails. Any raised decks and porches should have guardrails on the edges. Have any leaves, snow, or ice cleared regularly. Use sand or salt on walking paths  during winter. Clean up any spills in your garage right away. This includes oil or grease spills. What can I do in the bathroom? Use night lights. Install grab bars by the toilet and in the tub and shower. Do not use towel bars as grab bars. Use non-skid mats or decals in the tub or shower. If you need to sit down in the shower, use a plastic, non-slip stool. Keep the floor dry. Clean up any water that spills on the floor as soon as it happens. Remove soap buildup in the tub or shower regularly. Attach bath mats securely with double-sided non-slip rug tape. Do not have throw rugs and other things on the floor that can make you trip. What can I do in the bedroom? Use night lights. Make sure that you have a light by your bed that is easy to reach. Do not use any sheets or blankets that are too big for your bed. They should not hang down onto the floor. Have a firm chair that has side arms. You can use this for support while you get dressed. Do not have throw rugs and other things on  the floor that can make you trip. What can I do in the kitchen? Clean up any spills right away. Avoid walking on wet floors. Keep items that you use a lot in easy-to-reach places. If you need to reach something above you, use a strong step stool that has a grab bar. Keep electrical cords out of the way. Do not use floor polish or wax that makes floors slippery. If you must use wax, use non-skid floor wax. Do not have throw rugs and other things on the floor that can make you trip. What can I do with my stairs? Do not leave any items on the stairs. Make sure that there are handrails on both sides of the stairs and use them. Fix handrails that are broken or loose. Make sure that handrails are as long as the stairways. Check any carpeting to make sure that it is firmly attached to the stairs. Fix any carpet that is loose or worn. Avoid having throw rugs at the top or bottom of the stairs. If you do have throw  rugs, attach them to the floor with carpet tape. Make sure that you have a light switch at the top of the stairs and the bottom of the stairs. If you do not have them, ask someone to add them for you. What else can I do to help prevent falls? Wear shoes that: Do not have high heels. Have rubber bottoms. Are comfortable and fit you well. Are closed at the toe. Do not wear sandals. If you use a stepladder: Make sure that it is fully opened. Do not climb a closed stepladder. Make sure that both sides of the stepladder are locked into place. Ask someone to hold it for you, if possible. Clearly mark and make sure that you can see: Any grab bars or handrails. First and last steps. Where the edge of each step is. Use tools that help you move around (mobility aids) if they are needed. These include: Canes. Walkers. Scooters. Crutches. Turn on the lights when you go into a dark area. Replace any light bulbs as soon as they burn out. Set up your furniture so you have a clear path. Avoid moving your furniture around. If any of your floors are uneven, fix them. If there are any pets around you, be aware of where they are. Review your medicines with your doctor. Some medicines can make you feel dizzy. This can increase your chance of falling. Ask your doctor what other things that you can do to help prevent falls. This information is not intended to replace advice given to you by your health care provider. Make sure you discuss any questions you have with your health care provider. Document Released: 11/16/2008 Document Revised: 06/28/2015 Document Reviewed: 02/24/2014 Elsevier Interactive Patient Education  2017 [employer].

## 2022-07-11 ENCOUNTER — Ambulatory Visit (INDEPENDENT_AMBULATORY_CARE_PROVIDER_SITE_OTHER): Payer: Medicare Other | Admitting: Primary Care

## 2022-07-11 ENCOUNTER — Encounter: Payer: Self-pay | Admitting: Primary Care

## 2022-07-11 VITALS — BP 128/68 | HR 96 | Temp 97.5°F | Ht 65.0 in | Wt 229.0 lb

## 2022-07-11 DIAGNOSIS — R051 Acute cough: Secondary | ICD-10-CM

## 2022-07-11 LAB — POC COVID19 BINAXNOW: SARS Coronavirus 2 Ag: NEGATIVE

## 2022-07-11 MED ORDER — BENZONATATE 200 MG PO CAPS
200.0000 mg | ORAL_CAPSULE | Freq: Three times a day (TID) | ORAL | 0 refills | Status: DC | PRN
Start: 2022-07-11 — End: 2022-08-21

## 2022-07-11 MED ORDER — DOXYCYCLINE HYCLATE 100 MG PO TABS: 100.0000 mg | ORAL_TABLET | Freq: Two times a day (BID) | ORAL | 0 refills | Status: DC

## 2022-07-11 NOTE — Progress Notes (Signed)
Subjective:    Patient ID: Katrina Huffman, female    DOB: May 10, 1955, 67 y.o.   MRN: [id number]  Cough Associated symptoms include chills, a fever and postnasal drip. Pertinent negatives include no shortness of breath.    Katrina Huffman is a very pleasant 67 y.o. female patient of Dr. Milinda Antis with a history of hypertension, OSA, prediabetes who presents today to discuss cough.  Symptom onset 6 days ago with fatigue. She then developed sore throat, body aches, chills, cough, fever with Tmax of 103.8. She's having a lot of work done in her home including new flooring which contained bothersome fumes. Her last fever was 101.2 this morning. She slept all afternoon yesterday.   Today she's not feeling much better. She continues to feel very fatigued, her fevers have continued. Her most bothersome symptom is her cough and chills. She's been taken Tylenol.   Evaluated and treated by PCP 3 months ago for similar symptoms, diagnosed with acute bronchitis and treated with doxycycline and prednisone. Chest xray was negative for pneumonia. Her symptoms quickly improved after treatment.   She did smoke on occasion, quit about 15 years ago. She denies a history of asthma.   BP Readings from Last 3 Encounters:  07/11/22 128/68  04/21/22 126/68  12/12/21 120/72        Review of Systems  Constitutional:  Positive for chills, fatigue and fever.  HENT:  Positive for postnasal drip. Negative for congestion.   Respiratory:  Positive for cough. Negative for shortness of breath.          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   History of 2019 novel coronavirus disease (COVID-19) 02/28/2019   positive results in care everywhere;  12-15-2019  per  pt mild symptoms resolved in just over 2 wks with no residual   History of hypertension    per pt hx dx HTN prior to gastric bypass 12/ 2018,  since lost wt no issue   OSA (obstructive sleep apnea)    12-15-2019 per pt has not used cpap since 01/ 2021 stated does not need   PONV (postoperative nausea and vomiting)    PONV after knee arthroplasty   Right ureteral stone    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   Scoliosis    sees chiropracter monthly   Wears glasses     Social History   Socioeconomic History   Marital status: Divorced    Spouse name: Not on file   Number of children: Not on file   Years of education: Not on file   Highest education level: Not on file  Occupational History   Not on file  Tobacco Use   Smoking status: Never   Smokeless tobacco: Never  Vaping Use   Vaping Use: Never used  Substance and Sexual Activity   Alcohol use: Not Currently    Comment: rare   Drug use: Never   Sexual activity: Not on file  Other Topics Concern   Not on file  Social History Narrative   Right handed   Drinks caffeine   One story home   Social Determinants of Health   Financial Resource Strain: Low Risk  (06/25/2022)  Overall Financial Resource Strain (CARDIA)    Difficulty of Paying Living Expenses: Not hard at all  Food Insecurity: No Food Insecurity (06/25/2022)   Hunger Vital Sign    Worried About Running Out of Food in the Last Year: Never true    Ran Out of Food in the Last Year: Never true  Transportation Needs: No Transportation Needs (06/25/2022)   PRAPARE - Administrator, Civil Service (Medical): No    Lack of Transportation (Non-Medical): No  Physical Activity: Insufficiently Active (06/25/2022)   Exercise Vital Sign    Days of Exercise per Week: 2 days    Minutes of Exercise per Session: 30 min  Stress: No Stress Concern Present (06/25/2022)   [employer] of Occupational Health - Occupational Stress Questionnaire    Feeling of  Stress : Not at all  Social Connections: Socially Integrated (06/25/2022)   Social Connection and Isolation Panel [NHANES]    Frequency of Communication with Friends and Family: More than three times a week    Frequency of Social Gatherings with Friends and Family: More than three times a week    Attends Religious Services: More than 4 times per year    Active Member of Clubs or Organizations: Yes    Attends Banker Meetings: More than 4 times per year    Marital Status: Married  Catering manager Violence: Not At Risk (06/25/2022)   Humiliation, Afraid, Rape, and Kick questionnaire    Fear of Current or Ex-Partner: No    Emotionally Abused: No    Physically Abused: No    Sexually Abused: No    Past Surgical History:  Procedure Laterality Date   BREAST SURGERY  1999   breast biopsy   BUNIONECTOMY  2000   CESAREAN SECTION  1983; 1985   COLONOSCOPY WITH PROPOFOL N/A 04/30/2015   Procedure: COLONOSCOPY WITH PROPOFOL;  Surgeon: Midge Minium, MD;  Location: [facility];  Service: Endoscopy;  Laterality: N/A;   COLONOSCOPY WITH PROPOFOL N/A 05/03/2019   Procedure: COLONOSCOPY WITH PROPOFOL;  Surgeon: Midge Minium, MD;  Location: [facility];  Service: Endoscopy;  Laterality: N/A;  patient was COVID POSITIVE on 02/26/2019   CYSTOSCOPY WITH RETROGRADE PYELOGRAM, URETEROSCOPY AND STENT PLACEMENT Right 12/22/2019   Procedure: CYSTOSCOPY WITH RIGHT RETROGRADE PYELOGRAM, URETEROSCOPY WITH HOLMIUM LASER , BASKET EXTRACTION OF STONES AND STENT PLACEMENT;  Surgeon: Crist Fat, MD;  Location: [facility];  Service: Urology;  Laterality: Right;   DIAGNOSTIC LAPAROSCOPY  01/19/2017   LOA w/ Gastropexy/ EGD   EXTRACORPOREAL SHOCK WAVE LITHOTRIPSY Right 08/22/2019   Procedure: RIGHT EXTRACORPOREAL SHOCK WAVE LITHOTRIPSY (ESWL);  Surgeon: Rene Paci, MD;  Location: [facility];  Service: Urology;  Laterality: Right;   HYSTEROSCOPY WITH  D & C  x2  last one 01/ 2017   KNEE ARTHROSCOPY Left 05/09/2015   Procedure: ARTHROSCOPY LEFT KNEE WITH SYNOVECTOMY;  Surgeon: Ollen Gross, MD;  Location: [facility];  Service: Orthopedics;  Laterality: Left;   KNEE ARTHROSCOPY Left 04/27/2017   Procedure: Left knee arthroscopy; synovectomy;  Surgeon: Ollen Gross, MD;  Location: [facility];  Service: Orthopedics;  Laterality: Left;   KNEE ARTHROSCOPY Left 2009   LAPAROSCOPIC GASTRIC SLEEVE RESECTION  01/14/2017   POLYPECTOMY  04/30/2015   Procedure: POLYPECTOMY;  Surgeon: Midge Minium, MD;  Location: [facility];  Service: Endoscopy;;   TONSILLECTOMY AND ADENOIDECTOMY  2007   TOTAL KNEE ARTHROPLASTY Right 04/27/2017   Procedure: RIGHT TOTAL KNEE ARTHROPLASTY;  Surgeon: Ollen Gross, MD;  Location: [facility];  Service: Orthopedics;  Laterality: Right;   TOTAL KNEE ARTHROPLASTY Left 2014   TOTAL KNEE ARTHROPLASTY WITH REVISION COMPONENTS Left 04/16/2016   Procedure: LEFT TOTAL KNEE ARTHROPLASTY WITH POLY REVISION;  Surgeon: Ollen Gross, MD;  Location: [facility];  Service: Orthopedics;  Laterality: Left;  with abductor block   TOTAL SHOULDER ARTHROPLASTY Right 10/16/2016   TOTAL SHOULDER ARTHROPLASTY Right 10/16/2016   Procedure: TOTAL SHOULDER ARTHROPLASTY;  Surgeon: Francena Hanly, MD;  Location: [facility];  Service: Orthopedics;  Laterality: Right;   TUBAL LIGATION  1986 & 1991   VEIN LIGATION AND STRIPPING  2000    Family History  Problem Relation Age of Onset   Arthritis Mother    Hypertension Mother    Arthritis Father    Cancer Father        prostate CA   Hypertension Father    Breast cancer Paternal Aunt    Cancer Paternal Aunt        breast cancer   Arthritis Maternal Grandmother    Hypertension Maternal Grandmother    Arthritis Maternal Grandfather    Hypertension Maternal Grandfather    Diabetes Maternal Grandfather    Diabetes Brother     No Known Allergies  Current Outpatient Medications on File Prior to Visit  Medication  Sig Dispense Refill   albuterol (VENTOLIN HFA) 108 (90 Base) MCG/ACT inhaler Inhale 2 puffs into the lungs every 4 (four) hours as needed for wheezing. 1 each 0   ALPRAZolam (XANAX) 0.5 MG tablet Take 1 tablet (0.5 mg total) by mouth daily as needed for anxiety. 20 tablet 0   Calcium 500-100 MG-UNIT CHEW Chew by mouth.     Cholecalciferol (VITAMIN D) 50 MCG (2000 UT) tablet Take 4,000 Units by mouth daily.     citalopram (CELEXA) 20 MG tablet Take 1 tablet (20 mg total) by mouth daily. 90 tablet 3   Cranberry 400 MG CAPS Take by mouth daily.      gabapentin (NEURONTIN) 300 MG capsule TAKE 3 CAPSULES BY MOUTH 3  TIMES DAILY 810 capsule 1   meloxicam (MOBIC) 15 MG tablet TAKE 1 TABLET BY MOUTH DAILY  WITH FOOD AS NEEDED FOR PAIN 100 tablet 1   Multiple Vitamins-Minerals (BARIATRIC MULTIVITAMINS/IRON) CAPS Take by mouth daily.     No current facility-administered medications on file prior to visit.    BP 128/68   Pulse 96   Temp (!) 97.5 F (36.4 C) (Temporal)   Ht 5\' 5"  (1.651 m)   Wt 229 lb (103.9 kg)   SpO2 98%   BMI 38.11 kg/m  Objective:   Physical Exam Constitutional:      Appearance: She is ill-appearing.  HENT:     Right Ear: Tympanic membrane and ear canal normal.     Left Ear: Tympanic membrane and ear canal normal.     Nose:     Right Sinus: No maxillary sinus tenderness or frontal sinus tenderness.     Left Sinus: No maxillary sinus tenderness or frontal sinus tenderness.     Mouth/Throat:     Pharynx: No posterior oropharyngeal erythema.  Eyes:     Conjunctiva/sclera: Conjunctivae normal.  Cardiovascular:     Rate and Rhythm: Normal rate and regular rhythm.  Pulmonary:     Effort: Pulmonary effort is normal.     Breath sounds: Normal breath sounds. No wheezing or rales.     Comments: Dry cough noted several times during visit Musculoskeletal:  Cervical back: Neck supple.  Lymphadenopathy:     Cervical: No cervical adenopathy.  Skin:    General: Skin is  warm and dry.           Assessment & Plan:  Acute cough -     POC COVID-19 BinaxNow -     Doxycycline Hyclate; Take 1 tablet (100 mg total) by mouth 2 (two) times daily.  Dispense: 14 tablet; Refill: 0 -     Benzonatate; Take 1 capsule (200 mg total) by mouth 3 (three) times daily as needed for cough.  Dispense: 15 capsule; Refill: 0        Doreene Nest, NP

## 2022-07-11 NOTE — Assessment & Plan Note (Signed)
Unclear etiology. Rapid COVID test today negative.  Do not suspect influenza.  Reviewed chest x-ray from last visit.  Symptoms today are quite similar to her prior visit with PCP.  Discussed with patient that her symptoms could be viral, but since she is not feeling better and is nearly 1 week into her symptoms we can proceed with antibiotic treatment.  She did very well on doxycycline previously, given her prior smoking history this could be a good option.  Start doxycycline 100 mg.  Take 1 tablet by mouth twice daily x 7 days.  Prescription for [employer] provided to use as needed.  Follow-up as needed.

## 2022-07-11 NOTE — Patient Instructions (Signed)
Start Doxycycline antibiotic for the infection. Take 1 tablet by mouth twice daily for 7 days.  You may take Benzonatate capsules for cough. Take 1 capsule by mouth three times daily as needed for cough.  It was a pleasure meeting you!

## 2022-08-20 ENCOUNTER — Other Ambulatory Visit: Payer: Self-pay | Admitting: Family Medicine

## 2022-08-20 DIAGNOSIS — F419 Anxiety disorder, unspecified: Secondary | ICD-10-CM

## 2022-08-21 ENCOUNTER — Encounter: Payer: Self-pay | Admitting: Obstetrics and Gynecology

## 2022-08-21 ENCOUNTER — Ambulatory Visit: Payer: Medicare Other | Admitting: Obstetrics and Gynecology

## 2022-08-21 VITALS — BP 143/87 | HR 76 | Resp 16 | Ht 65.0 in | Wt 232.2 lb

## 2022-08-21 DIAGNOSIS — N9089 Other specified noninflammatory disorders of vulva and perineum: Secondary | ICD-10-CM

## 2022-08-21 DIAGNOSIS — Z01818 Encounter for other preprocedural examination: Secondary | ICD-10-CM

## 2022-08-21 DIAGNOSIS — T8332XD Displacement of intrauterine contraceptive device, subsequent encounter: Secondary | ICD-10-CM

## 2022-08-21 DIAGNOSIS — Z8742 Personal history of other diseases of the female genital tract: Secondary | ICD-10-CM

## 2022-08-21 NOTE — Telephone Encounter (Signed)
Refilled once  Please schedule next annual exam

## 2022-08-21 NOTE — Progress Notes (Signed)
GYNECOLOGY PROGRESS NOTE  Subjective:    Patient ID: Katrina Huffman, female    DOB: 1955/10/16, 67 y.o.   MRN: [id number]  HPI   Patient is a 67 y.o. female who presents for further management of  of lost IUD threads. IUD was inserted on 09/30/2021 for postmenopausal bleeding and endometrial hyperplasia (this is her second IU. Patient reports intermittent twinges of pain since insertion, reports very difficult and painful insertion. She had an ultrasound done on 10/28/2021 to determine location which noted IUD in upper uterine segment but unable to completely visualize arms.  Desires removal atht is time as she is noting twinges of pain with the IUD.    Patient also complaining of external vulvar itching intermittently over the past several months.    The following portions of the patient's history were reviewed and updated as appropriate: allergies, current medications, past family history, past medical history, past social history, past surgical history, and problem list.  Review of Systems Pertinent items noted in HPI and remainder of comprehensive ROS otherwise negative.   Objective:   Blood pressure (!) 143/87, pulse 76, resp. rate 16, height 5\' 5"  (1.651 m), weight 232 lb 3.2 oz (105.3 kg).  Body mass index is 38.64 kg/m. General appearance: alert and no distress Abdomen: Non-tender, no masses palpable Pelvis: external genitalia normal, rectovaginal septum normal.  Vagina with scant thin discharge. Unable to visualize cervix due to discomfort with speculum exam.   Imaging:  CLINICAL DATA:  IUD position and endometrial thickness   EXAM: ULTRASOUND PELVIS TRANSVAGINAL   TECHNIQUE: Transvaginal ultrasound examination of the pelvis was performed including evaluation of the uterus, ovaries, adnexal regions, and pelvic cul-de-sac.   COMPARISON:  None Available.   FINDINGS: Uterus   Measurements: 7.1 x 3.1 x 4.2 cm = volume: 48 mL. No fibroids or other mass visualized.    Endometrium   Thickness: 4 mm. IUD difficult to visualized. Appears to be within the upper uterine segment, T arms poorly visual ball on transverse images.   Right ovary   Measurements: 2.5 x 1.4 x 1.9 cm = volume: 3.5 mL. Cyst measuring 13 x 11 x 11 mm   Left ovary   Not seen   Other findings:  No abnormal free fluid   IMPRESSION: 1. Difficult visualization of IUD, appears to be within the upper uterine segment endometrium 2. Normal postmenopausal endometrial thickness of 4 mm 3. 13 mm right ovarian cyst. No followup imaging recommended. Note: This recommendation does not apply to premenarchal patients or to those with increased risk (genetic, family history, elevated tumor markers or other high-risk factors) of ovarian cancer. Reference: Radiology 2019 Nov; 293(2):359-371.     Electronically Signed   By: Jasmine Pang M.D.   On: 10/28/2021 20:06   Assessment:   1. History of endometrial hyperplasia   2. Intrauterine contraceptive device threads lost, subsequent encounter   3. Vulvar irritation   4. Preoperative exam for gynecologic surgery     Plan:   - Discussed removal with patient.  Patient with traumatic insertion, desires removal under sedation or anesthesia. Will plan for Hysteroscopic IUD removal, also will perform D&C to sample endometrial lining.  The risks of surgery were discussed in detail with the patient including but not limited to: bleeding which may require transfusion or reoperation; infection which may require prolonged hospitalization or re-hospitalization and antibiotic therapy; injury to bowel, bladder, ureters and major vessels or other surrounding organs which may lead to other procedures; formation  of adhesions; need for additional procedures including laparotomy or subsequent procedures secondary to intraoperative injury or abnormal pathology; thromboembolic phenomenon; incisional problems and other postoperative or anesthesia complications. All  questions were answered.  She was told that she will be contacted by our surgical scheduler regarding the time and date of her surgery; routine preoperative instructions will be given to her by the preoperative nursing team.  Desires to have procedure some time in September. Patient education handouts about the procedure were given to the patient to review at home.  - Vulvar irritation, no obvious skin changes, advised on OTC topical remedies including coconut oil, tea tree oil, or hydrocortisone cream.    Hildred Laser, MD  [facility]

## 2022-08-21 NOTE — Telephone Encounter (Signed)
Last filled on 09/04/21 #90 tab with 3 refills, pt's had recent acute appts but last CPE was 09/04/21 and no future appts.

## 2022-08-22 NOTE — Telephone Encounter (Signed)
Patient has been scheduled

## 2022-09-02 DIAGNOSIS — H2513 Age-related nuclear cataract, bilateral: Secondary | ICD-10-CM | POA: Diagnosis not present

## 2022-09-02 DIAGNOSIS — G500 Trigeminal neuralgia: Secondary | ICD-10-CM | POA: Diagnosis not present

## 2022-09-10 ENCOUNTER — Telehealth: Payer: Self-pay | Admitting: Family Medicine

## 2022-09-10 DIAGNOSIS — Z13220 Encounter for screening for lipoid disorders: Secondary | ICD-10-CM

## 2022-09-10 DIAGNOSIS — R7303 Prediabetes: Secondary | ICD-10-CM

## 2022-09-10 DIAGNOSIS — E559 Vitamin D deficiency, unspecified: Secondary | ICD-10-CM

## 2022-09-10 DIAGNOSIS — I10 Essential (primary) hypertension: Secondary | ICD-10-CM

## 2022-09-10 NOTE — Telephone Encounter (Signed)
-----   Message from Lovena Neighbours sent at 08/26/2022  2:00 PM EDT ----- Regarding: Labs for 8.8.24 Please put physical lab orders in future. Thank you, Denny Peon

## 2022-09-11 ENCOUNTER — Other Ambulatory Visit (INDEPENDENT_AMBULATORY_CARE_PROVIDER_SITE_OTHER): Payer: Medicare Other

## 2022-09-11 DIAGNOSIS — Z13220 Encounter for screening for lipoid disorders: Secondary | ICD-10-CM

## 2022-09-11 DIAGNOSIS — R7303 Prediabetes: Secondary | ICD-10-CM

## 2022-09-11 DIAGNOSIS — E559 Vitamin D deficiency, unspecified: Secondary | ICD-10-CM | POA: Diagnosis not present

## 2022-09-11 DIAGNOSIS — I10 Essential (primary) hypertension: Secondary | ICD-10-CM

## 2022-09-11 LAB — COMPREHENSIVE METABOLIC PANEL
ALT: 11 U/L (ref 0–35)
AST: 15 U/L (ref 0–37)
Albumin: 4.1 g/dL (ref 3.5–5.2)
Alkaline Phosphatase: 72 U/L (ref 39–117)
BUN: 17 mg/dL (ref 6–23)
CO2: 32 mEq/L (ref 19–32)
Calcium: 9 mg/dL (ref 8.4–10.5)
Chloride: 104 mEq/L (ref 96–112)
Creatinine, Ser: 0.78 mg/dL (ref 0.40–1.20)
GFR: 77.94 mL/min (ref >60.00)
Glucose, Bld: 106 mg/dL — ABNORMAL HIGH (ref 70–99)
Potassium: 4.2 mEq/L (ref 3.5–5.1)
Sodium: 143 mEq/L (ref 135–145)
Total Bilirubin: 0.5 mg/dL (ref 0.2–1.2)
Total Protein: 6.3 g/dL (ref 6.0–8.3)

## 2022-09-11 LAB — LIPID PANEL
Cholesterol: 175 mg/dL (ref 0–200)
HDL: 56.7 mg/dL (ref >39.00)
LDL Cholesterol: 97 mg/dL (ref 0–99)
NonHDL: 118.05
Total CHOL/HDL Ratio: 3
Triglycerides: 103 mg/dL (ref 0.0–149.0)
VLDL: 20.6 mg/dL (ref 0.0–40.0)

## 2022-09-11 LAB — CBC WITH DIFFERENTIAL/PLATELET
Basophils Absolute: 0.1 10*3/uL (ref 0.0–0.1)
Basophils Relative: 1.1 % (ref 0.0–3.0)
Eosinophils Absolute: 0.3 10*3/uL (ref 0.0–0.7)
Eosinophils Relative: 4 % (ref 0.0–5.0)
HCT: 41.3 % (ref 36.0–46.0)
Hemoglobin: 13.1 g/dL (ref 12.0–15.0)
Lymphocytes Relative: 25.4 % (ref 12.0–46.0)
Lymphs Abs: 1.6 10*3/uL (ref 0.7–4.0)
MCHC: 31.7 g/dL (ref 30.0–36.0)
MCV: 87.6 fl (ref 78.0–100.0)
Monocytes Absolute: 0.5 10*3/uL (ref 0.1–1.0)
Monocytes Relative: 8.5 % (ref 3.0–12.0)
Neutro Abs: 3.9 10*3/uL (ref 1.4–7.7)
Neutrophils Relative %: 61 % (ref 43.0–77.0)
Platelets: 238 10*3/uL (ref 150.0–400.0)
RBC: 4.71 Mil/uL (ref 3.87–5.11)
RDW: 14.8 % (ref 11.5–15.5)
WBC: 6.4 10*3/uL (ref 4.0–10.5)

## 2022-09-11 LAB — HEMOGLOBIN A1C: Hgb A1c MFr Bld: 5.6 % (ref 4.6–6.5)

## 2022-09-11 LAB — TSH: TSH: 3.7 u[IU]/mL (ref 0.35–5.50)

## 2022-09-11 LAB — VITAMIN D 25 HYDROXY (VIT D DEFICIENCY, FRACTURES): VITD: 63.23 ng/mL (ref 30.00–100.00)

## 2022-09-12 ENCOUNTER — Other Ambulatory Visit: Payer: Self-pay | Admitting: Family Medicine

## 2022-09-12 DIAGNOSIS — Z0000 Encounter for general adult medical examination without abnormal findings: Secondary | ICD-10-CM

## 2022-09-15 ENCOUNTER — Ambulatory Visit: Payer: Medicare Other | Admitting: Family Medicine

## 2022-09-15 ENCOUNTER — Encounter: Payer: Self-pay | Admitting: Family Medicine

## 2022-09-15 VITALS — BP 130/80 | HR 76 | Temp 97.9°F | Ht 64.25 in | Wt 229.1 lb

## 2022-09-15 DIAGNOSIS — Z0000 Encounter for general adult medical examination without abnormal findings: Secondary | ICD-10-CM | POA: Diagnosis not present

## 2022-09-15 DIAGNOSIS — F411 Generalized anxiety disorder: Secondary | ICD-10-CM

## 2022-09-15 DIAGNOSIS — M19049 Primary osteoarthritis, unspecified hand: Secondary | ICD-10-CM | POA: Insufficient documentation

## 2022-09-15 DIAGNOSIS — M19041 Primary osteoarthritis, right hand: Secondary | ICD-10-CM | POA: Diagnosis not present

## 2022-09-15 DIAGNOSIS — Z13220 Encounter for screening for lipoid disorders: Secondary | ICD-10-CM

## 2022-09-15 DIAGNOSIS — R7303 Prediabetes: Secondary | ICD-10-CM

## 2022-09-15 DIAGNOSIS — E559 Vitamin D deficiency, unspecified: Secondary | ICD-10-CM | POA: Diagnosis not present

## 2022-09-15 DIAGNOSIS — R053 Chronic cough: Secondary | ICD-10-CM | POA: Diagnosis not present

## 2022-09-15 DIAGNOSIS — M19042 Primary osteoarthritis, left hand: Secondary | ICD-10-CM

## 2022-09-15 DIAGNOSIS — Z9884 Bariatric surgery status: Secondary | ICD-10-CM

## 2022-09-15 DIAGNOSIS — M419 Scoliosis, unspecified: Secondary | ICD-10-CM

## 2022-09-15 DIAGNOSIS — I10 Essential (primary) hypertension: Secondary | ICD-10-CM

## 2022-09-15 DIAGNOSIS — N8500 Endometrial hyperplasia, unspecified: Secondary | ICD-10-CM

## 2022-09-15 MED ORDER — FAMOTIDINE 20 MG PO TABS: 20.0000 mg | ORAL_TABLET | Freq: Every day | ORAL | 3 refills | Status: DC

## 2022-09-15 NOTE — Assessment & Plan Note (Signed)
Disc goals for lipids and reasons to control them Rev last labs with pt Rev low sat fat diet in detail Improved LDL this time Encouraged her to keep up the good work

## 2022-09-15 NOTE — Assessment & Plan Note (Signed)
Lab Results  Component Value Date   HGBA1C 5.6 09/11/2022   disc imp of low glycemic diet and wt loss to prevent DM2 (also strength building exercise)

## 2022-09-15 NOTE — Assessment & Plan Note (Signed)
Had a rough year  Continues celexa 20 mg daily and prn xanax  Hopes for less stress upcoming

## 2022-09-15 NOTE — Patient Instructions (Addendum)
Get a flu shot in the fall   Check in with your pharmacy about RSV vaccine    Get back to walking  Add some strength training to your routine, this is important for bone and brain health and can reduce your risk of falls and help your body use insulin properly and regulate weight  Light weights, exercise bands , and internet videos are a good way to start  Yoga (chair or regular), machines , floor exercises or a gym with machines are also good options    Try pepcid 20 mg in evening  Follow up in about a month to discuss chronic cough   Take care of yourself

## 2022-09-15 NOTE — Assessment & Plan Note (Signed)
BP: 130/80  No medicines currently  Minimizes nsaids Encouraged diet with less processed foods and exercise as tolerated

## 2022-09-15 NOTE — Assessment & Plan Note (Signed)
Bmi is 30.02 Reviewed last D and B12 levels  No anemia A1c 5.6

## 2022-09-15 NOTE — Assessment & Plan Note (Addendum)
Reviewed health habits including diet and exercise and skin cancer prevention Reviewed appropriate screening tests for age  Also reviewed health mt list, fam hx and immunization status , as well as social and family history   See HPI Labs reviewed and ordered Pt will return to discuss chronic cough and hand/wrist pain  Had her shingrix vaccines  Encouraged RSV shot - since she had multiple uri last year  Also plans flu shot in the fall  Mammogram utd 05/2022 Sees gyn for gyn care  Colonoscopy 04/2019 with 5 y recall for polyps Dexa is scheduled later this month/ no falls or fractures and D level is good  PHQ up a bit after emotionally hard year, continues citalopram
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
## 2022-09-15 NOTE — Assessment & Plan Note (Signed)
Has had weight loss surgery  Bmi of 39.02 Discussed how this problem influences overall health and the risks it imposes  Reviewed plan for weight loss with lower calorie diet (via better food choices (lower glycemic and portion control) along with exercise building up to or more than 30 minutes 5 days per week including some aerobic activity and strength training

## 2022-09-15 NOTE — Assessment & Plan Note (Signed)
Last vitamin D Lab Results  Component Value Date   VD25OH 63.23 09/11/2022   Vitamin D level is therapeutic with current supplementation Disc importance of this to bone and overall health

## 2022-09-15 NOTE — Telephone Encounter (Signed)
Pt has CPE scheduled this afternoon, last filled 04/04/22 #100 tabs/ 1 refill, ? If to soon to fill

## 2022-09-15 NOTE — Assessment & Plan Note (Signed)
Baseline noted No kyphosis today

## 2022-09-15 NOTE — Assessment & Plan Note (Signed)
Pt has iud but strings were not able to be located Planning IUD removal and endo bx and (? D and C) in hospital with gyn this fall

## 2022-09-15 NOTE — Assessment & Plan Note (Signed)
Since march Had several uri in a row  Normal exam Sarcoidosis runs in family    Will try pepcid 20 mg at bedtime to see if GERD plays a role   Follow up in 1 mo for visit / may do imaging

## 2022-09-18 ENCOUNTER — Encounter (INDEPENDENT_AMBULATORY_CARE_PROVIDER_SITE_OTHER): Payer: Self-pay

## 2022-09-19 ENCOUNTER — Encounter: Payer: Self-pay | Admitting: Family Medicine

## 2022-09-25 ENCOUNTER — Encounter: Payer: Self-pay | Admitting: Obstetrics and Gynecology

## 2022-09-25 ENCOUNTER — Telehealth: Payer: Self-pay

## 2022-09-25 NOTE — Telephone Encounter (Signed)
Message has been routed to Dr. Valentino Saxon.

## 2022-09-25 NOTE — Telephone Encounter (Signed)
Pt calling; wants to speak to [facility]'s nurse - is soon to be scheduled in hospital for IUD removeal; also has a breast cyst that is changing, is very painful, and purple around it; doesn't want it to get infected; can't get in c Dermatology.  How soon will the hosp appt be scheduled? [phone]

## 2022-09-27 ENCOUNTER — Other Ambulatory Visit: Payer: Self-pay | Admitting: Obstetrics and Gynecology

## 2022-09-27 NOTE — H&P (View-Only) (Signed)
GYNECOLOGY PREOPERATIVE HISTORY AND PHYSICAL   Subjective:  Katrina Huffman is a 67 y.o. female here for surgical management of lost IUD threads.   Indications for procedure include: pelvic pain, and lost IUD threads.  She has a history of endometrial hyperplasia.  This is her second IUD insertion for management. She had an ultrasound done on 10/28/2021 to determine location which noted IUD in upper uterine segment but unable to completely visualize arms. Desires removal atht is time as she is noting twinges of pain with the IUD. No significant preoperative concerns.  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   History of 2019 novel coronavirus disease (COVID-19) 02/28/2019   positive results in care everywhere;  12-15-2019  per pt mild symptoms resolved in just over 2 wks with no residual   History of hypertension    per pt hx dx HTN prior to gastric bypass 12/ 2018,  since lost wt no issue   OSA (obstructive sleep apnea)    12-15-2019 per pt has not used cpap since 01/ 2021 stated does not need   PONV (postoperative nausea and vomiting)    PONV after knee arthroplasty   Right ureteral stone    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   Scoliosis    sees chiropracter monthly   Sleep apnea    Wears glasses     Past Surgical History:  Procedure Laterality Date   BREAST SURGERY  1999   breast biopsy   BUNIONECTOMY  2000   CESAREAN SECTION  1983; 1985   COLONOSCOPY WITH PROPOFOL N/A 04/30/2015   Procedure: COLONOSCOPY WITH PROPOFOL;   Surgeon: Midge Minium, MD;  Location: [facility];  Service: Endoscopy;  Laterality: N/A;   COLONOSCOPY WITH PROPOFOL N/A 05/03/2019   Procedure: COLONOSCOPY WITH PROPOFOL;  Surgeon: Midge Minium, MD;  Location: [facility];  Service: Endoscopy;  Laterality: N/A;  patient was COVID POSITIVE on 02/26/2019   CYSTOSCOPY WITH RETROGRADE PYELOGRAM, URETEROSCOPY AND STENT PLACEMENT Right 12/22/2019   Procedure: CYSTOSCOPY WITH RIGHT RETROGRADE PYELOGRAM, URETEROSCOPY WITH HOLMIUM LASER , BASKET EXTRACTION OF STONES AND STENT PLACEMENT;  Surgeon: Crist Fat, MD;  Location: [facility];  Service: Urology;  Laterality: Right;   DIAGNOSTIC LAPAROSCOPY  01/19/2017   LOA w/ Gastropexy/ EGD   EXTRACORPOREAL SHOCK WAVE LITHOTRIPSY Right 08/22/2019   Procedure: RIGHT EXTRACORPOREAL SHOCK WAVE LITHOTRIPSY (ESWL);  Surgeon: Rene Paci, MD;  Location: [facility];  Service: Urology;  Laterality: Right;   EYE SURGERY     HYSTEROSCOPY WITH D & C  x2  last one 01/ 2017   JOINT REPLACEMENT     KNEE ARTHROSCOPY Left 05/09/2015   Procedure: ARTHROSCOPY LEFT KNEE WITH SYNOVECTOMY;  Surgeon: Ollen Gross, MD;  Location: [facility];  Service: Orthopedics;  Laterality: Left;   KNEE ARTHROSCOPY Left 04/27/2017   Procedure: Left knee arthroscopy; synovectomy;  Surgeon: Ollen Gross, MD;  Location: [facility];  Service: Orthopedics;  Laterality: Left;   KNEE ARTHROSCOPY Left 2009  LAPAROSCOPIC GASTRIC SLEEVE RESECTION  01/14/2017   POLYPECTOMY  04/30/2015   Procedure: POLYPECTOMY;  Surgeon: Midge Minium, MD;  Location: [facility];  Service: Endoscopy;;   TONSILLECTOMY AND ADENOIDECTOMY  2007   TOTAL KNEE ARTHROPLASTY Right 04/27/2017   Procedure: RIGHT TOTAL KNEE ARTHROPLASTY;  Surgeon: Ollen Gross, MD;  Location: [facility];  Service: Orthopedics;  Laterality: Right;   TOTAL KNEE ARTHROPLASTY Left 2014   TOTAL KNEE ARTHROPLASTY WITH REVISION COMPONENTS Left  04/16/2016   Procedure: LEFT TOTAL KNEE ARTHROPLASTY WITH POLY REVISION;  Surgeon: Ollen Gross, MD;  Location: [facility];  Service: Orthopedics;  Laterality: Left;  with abductor block   TOTAL SHOULDER ARTHROPLASTY Right 10/16/2016   TOTAL SHOULDER ARTHROPLASTY Right 10/16/2016   Procedure: TOTAL SHOULDER ARTHROPLASTY;  Surgeon: Francena Hanly, MD;  Location: [facility];  Service: Orthopedics;  Laterality: Right;   TUBAL LIGATION  1986 & 1991   VEIN LIGATION AND STRIPPING  2000    OB History  No obstetric history on file.    Family History  Problem Relation Age of Onset   Arthritis Mother    Hypertension Mother    Varicose Veins Mother    Arthritis Father    Cancer Father        prostate CA   Hypertension Father    Breast cancer Paternal Aunt    Cancer Paternal Aunt        breast cancer   Arthritis Maternal Grandmother    Hypertension Maternal Grandmother    Hearing loss Maternal Grandmother    Vision loss Maternal Grandmother    Arthritis Maternal Grandfather    Hypertension Maternal Grandfather    Diabetes Maternal Grandfather    Diabetes Brother    Anxiety disorder Brother    Depression Brother     Social History   Socioeconomic History   Marital status: Divorced    Spouse name: Not on file   Number of children: Not on file   Years of education: Not on file   Highest education level: Not on file  Occupational History   Not on file  Tobacco Use   Smoking status: Never   Smokeless tobacco: Never  Vaping Use   Vaping status: Never Used  Substance and Sexual Activity   Alcohol use: Not Currently    Comment: rare   Drug use: Never   Sexual activity: Not Currently  Other Topics Concern   Not on file  Social History Narrative   Right handed   Drinks caffeine   One story home   Social Determinants of Health   Financial Resource Strain: Low Risk  (06/25/2022)   Overall Financial Resource Strain (CARDIA)    Difficulty of Paying Living Expenses: Not hard at all   Food Insecurity: No Food Insecurity (06/25/2022)   Hunger Vital Sign    Worried About Running Out of Food in the Last Year: Never true    Ran Out of Food in the Last Year: Never true  Transportation Needs: No Transportation Needs (06/25/2022)   PRAPARE - Administrator, Civil Service (Medical): No    Lack of Transportation (Non-Medical): No  Physical Activity: Insufficiently Active (06/25/2022)   Exercise Vital Sign    Days of Exercise per Week: 2 days    Minutes of Exercise per Session: 30 min  Stress: No Stress Concern Present (06/25/2022)   [employer] of Occupational Health - Occupational Stress Questionnaire    Feeling of Stress : Not at all  Social Connections: Socially Integrated (  06/25/2022)   Social Connection and Isolation Panel [NHANES]    Frequency of Communication with Friends and Family: More than three times a week    Frequency of Social Gatherings with Friends and Family: More than three times a week    Attends Religious Services: More than 4 times per year    Active Member of [employer] or Organizations: Yes    Attends Engineer, structural: More than 4 times per year    Marital Status: Married  Catering manager Violence: Not At Risk (06/25/2022)   Humiliation, Afraid, Rape, and Kick questionnaire    Fear of Current or Ex-Partner: No    Emotionally Abused: No    Physically Abused: No    Sexually Abused: No    Current Outpatient Medications on File Prior to Visit  Medication Sig Dispense Refill   ALPRAZolam (XANAX) 0.5 MG tablet Take 1 tablet (0.5 mg total) by mouth daily as needed for anxiety. 20 tablet 0   Calcium 500-100 MG-UNIT CHEW Chew by mouth.     Cholecalciferol (VITAMIN D) 50 MCG (2000 UT) tablet Take 4,000 Units by mouth daily.     citalopram (CELEXA) 20 MG tablet TAKE 1 TABLET BY MOUTH DAILY 90 tablet 0   Cranberry 400 MG CAPS Take by mouth daily.      famotidine (PEPCID) 20 MG tablet Take 1 tablet (20 mg total) by mouth at bedtime. 30  tablet 3   gabapentin (NEURONTIN) 300 MG capsule TAKE 3 CAPSULES BY MOUTH 3  TIMES DAILY 810 capsule 1   meloxicam (MOBIC) 15 MG tablet TAKE 1 TABLET BY MOUTH DAILY  WITH FOOD AS NEEDED FOR PAIN 100 tablet 0   Multiple Vitamins-Minerals (BARIATRIC MULTIVITAMINS/IRON) CAPS Take by mouth daily.     No current facility-administered medications on file prior to visit.    No Known Allergies    Review of Systems Constitutional: No recent fever/chills/sweats Respiratory: No recent cough/bronchitis Cardiovascular: No chest pain Gastrointestinal: No recent nausea/vomiting/diarrhea Genitourinary: No UTI symptoms Hematologic/lymphatic:No history of coagulopathy or recent blood thinner use    Objective:   There were no vitals taken for this visit. CONSTITUTIONAL: Well-developed, well-nourished female in no acute distress.  HENT:  Normocephalic, atraumatic, External right and left ear normal. Oropharynx is clear and moist EYES: Conjunctivae and EOM are normal. Pupils are equal, round, and reactive to light. No scleral icterus.  NECK: Normal range of motion, supple, no masses SKIN: Skin is warm and dry. No rash noted. Not diaphoretic. No erythema. No pallor. NEUROLOGIC: Alert and oriented to person, place, and time. Normal reflexes, muscle tone coordination. No cranial nerve deficit noted. PSYCHIATRIC: Normal mood and affect. Normal behavior. Normal judgment and thought content. CARDIOVASCULAR: Normal heart rate noted, regular rhythm RESPIRATORY: Effort and breath sounds normal, no problems with respiration noted ABDOMEN: Soft, nontender, nondistended. PELVIC: Deferred MUSCULOSKELETAL: Normal range of motion. No edema and no tenderness. 2+ distal pulses.    Labs:  Lab Results  Component Value Date   WBC 6.4 09/11/2022   HGB 13.1 09/11/2022   HCT 41.3 09/11/2022   MCV 87.6 09/11/2022   PLT 238.0 09/11/2022     Imaging Studies:  CLINICAL DATA:  IUD position and endometrial  thickness   EXAM: ULTRASOUND PELVIS TRANSVAGINAL   TECHNIQUE: Transvaginal ultrasound examination of the pelvis was performed including evaluation of the uterus, ovaries, adnexal regions, and pelvic cul-de-sac.   COMPARISON:  None Available.   FINDINGS: Uterus   Measurements: 7.1 x 3.1 x 4.2 cm = volume:  48 mL. No fibroids or other mass visualized.   Endometrium   Thickness: 4 mm. IUD difficult to visualized. Appears to be within the upper uterine segment, T arms poorly visual ball on transverse images.   Right ovary   Measurements: 2.5 x 1.4 x 1.9 cm = volume: 3.5 mL. Cyst measuring 13 x 11 x 11 mm   Left ovary   Not seen   Other findings:  No abnormal free fluid   IMPRESSION: 1. Difficult visualization of IUD, appears to be within the upper uterine segment endometrium 2. Normal postmenopausal endometrial thickness of 4 mm 3. 13 mm right ovarian cyst. No followup imaging recommended. Note: This recommendation does not apply to premenarchal patients or to those with increased risk (genetic, family history, elevated tumor markers or other high-risk factors) of ovarian cancer. Reference: Radiology 2019 Nov; 293(2):359-371.     Electronically Signed   By: Jasmine Pang M.D.   On: 10/28/2021 20:06  Assessment:    Lost IUD threads Pelvic pain History of endometrial hyperplasia   Plan:   Counseling: Procedure, risks, reasons, benefits and complications (including injury to bowel, bladder, major blood vessel, ureter, bleeding, possibility of transfusion, infection, or fistula formation) reviewed in detail. Likelihood of success in alleviating the patient's condition was discussed. Routine postoperative instructions will be reviewed with the patient and her family in detail after surgery.  The patient concurred with the proposed plan, giving informed written consent for the surgery.   Preop testing ordered. Instructions reviewed, including NPO after midnight.      Hildred Laser, MD [facility]

## 2022-09-27 NOTE — H&P (Signed)
GYNECOLOGY PREOPERATIVE HISTORY AND PHYSICAL   Subjective:  Katrina Huffman is a 67 y.o. female here for surgical management of lost IUD threads.   Indications for procedure include: pelvic pain, and lost IUD threads.  She has a history of endometrial hyperplasia.  This is her second IUD insertion for management. She had an ultrasound done on 10/28/2021 to determine location which noted IUD in upper uterine segment but unable to completely visualize arms. Desires removal atht is time as she is noting twinges of pain with the IUD. No significant preoperative concerns.  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   History of 2019 novel coronavirus disease (COVID-19) 02/28/2019   positive results in care everywhere;  12-15-2019  per pt mild symptoms resolved in just over 2 wks with no residual   History of hypertension    per pt hx dx HTN prior to gastric bypass 12/ 2018,  since lost wt no issue   OSA (obstructive sleep apnea)    12-15-2019 per pt has not used cpap since 01/ 2021 stated does not need   PONV (postoperative nausea and vomiting)    PONV after knee arthroplasty   Right ureteral stone    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   Scoliosis    sees chiropracter monthly   Sleep apnea    Wears glasses     Past Surgical History:  Procedure Laterality Date   BREAST SURGERY  1999   breast biopsy   BUNIONECTOMY  2000   CESAREAN SECTION  1983; 1985   COLONOSCOPY WITH PROPOFOL N/A 04/30/2015   Procedure: COLONOSCOPY WITH PROPOFOL;   Surgeon: Midge Minium, MD;  Location: [facility];  Service: Endoscopy;  Laterality: N/A;   COLONOSCOPY WITH PROPOFOL N/A 05/03/2019   Procedure: COLONOSCOPY WITH PROPOFOL;  Surgeon: Midge Minium, MD;  Location: [facility];  Service: Endoscopy;  Laterality: N/A;  patient was COVID POSITIVE on 02/26/2019   CYSTOSCOPY WITH RETROGRADE PYELOGRAM, URETEROSCOPY AND STENT PLACEMENT Right 12/22/2019   Procedure: CYSTOSCOPY WITH RIGHT RETROGRADE PYELOGRAM, URETEROSCOPY WITH HOLMIUM LASER , BASKET EXTRACTION OF STONES AND STENT PLACEMENT;  Surgeon: Crist Fat, MD;  Location: [facility];  Service: Urology;  Laterality: Right;   DIAGNOSTIC LAPAROSCOPY  01/19/2017   LOA w/ Gastropexy/ EGD   EXTRACORPOREAL SHOCK WAVE LITHOTRIPSY Right 08/22/2019   Procedure: RIGHT EXTRACORPOREAL SHOCK WAVE LITHOTRIPSY (ESWL);  Surgeon: Rene Paci, MD;  Location: [facility];  Service: Urology;  Laterality: Right;   EYE SURGERY     HYSTEROSCOPY WITH D & C  x2  last one 01/ 2017   JOINT REPLACEMENT     KNEE ARTHROSCOPY Left 05/09/2015   Procedure: ARTHROSCOPY LEFT KNEE WITH SYNOVECTOMY;  Surgeon: Ollen Gross, MD;  Location: [facility];  Service: Orthopedics;  Laterality: Left;   KNEE ARTHROSCOPY Left 04/27/2017   Procedure: Left knee arthroscopy; synovectomy;  Surgeon: Ollen Gross, MD;  Location: [facility];  Service: Orthopedics;  Laterality: Left;   KNEE ARTHROSCOPY Left 2009  LAPAROSCOPIC GASTRIC SLEEVE RESECTION  01/14/2017   POLYPECTOMY  04/30/2015   Procedure: POLYPECTOMY;  Surgeon: Midge Minium, MD;  Location: [facility];  Service: Endoscopy;;   TONSILLECTOMY AND ADENOIDECTOMY  2007   TOTAL KNEE ARTHROPLASTY Right 04/27/2017   Procedure: RIGHT TOTAL KNEE ARTHROPLASTY;  Surgeon: Ollen Gross, MD;  Location: [facility];  Service: Orthopedics;  Laterality: Right;   TOTAL KNEE ARTHROPLASTY Left 2014   TOTAL KNEE ARTHROPLASTY WITH REVISION COMPONENTS Left  04/16/2016   Procedure: LEFT TOTAL KNEE ARTHROPLASTY WITH POLY REVISION;  Surgeon: Ollen Gross, MD;  Location: [facility];  Service: Orthopedics;  Laterality: Left;  with abductor block   TOTAL SHOULDER ARTHROPLASTY Right 10/16/2016   TOTAL SHOULDER ARTHROPLASTY Right 10/16/2016   Procedure: TOTAL SHOULDER ARTHROPLASTY;  Surgeon: Francena Hanly, MD;  Location: [facility];  Service: Orthopedics;  Laterality: Right;   TUBAL LIGATION  1986 & 1991   VEIN LIGATION AND STRIPPING  2000    OB History  No obstetric history on file.    Family History  Problem Relation Age of Onset   Arthritis Mother    Hypertension Mother    Varicose Veins Mother    Arthritis Father    Cancer Father        prostate CA   Hypertension Father    Breast cancer Paternal Aunt    Cancer Paternal Aunt        breast cancer   Arthritis Maternal Grandmother    Hypertension Maternal Grandmother    Hearing loss Maternal Grandmother    Vision loss Maternal Grandmother    Arthritis Maternal Grandfather    Hypertension Maternal Grandfather    Diabetes Maternal Grandfather    Diabetes Brother    Anxiety disorder Brother    Depression Brother     Social History   Socioeconomic History   Marital status: Divorced    Spouse name: Not on file   Number of children: Not on file   Years of education: Not on file   Highest education level: Not on file  Occupational History   Not on file  Tobacco Use   Smoking status: Never   Smokeless tobacco: Never  Vaping Use   Vaping status: Never Used  Substance and Sexual Activity   Alcohol use: Not Currently    Comment: rare   Drug use: Never   Sexual activity: Not Currently  Other Topics Concern   Not on file  Social History Narrative   Right handed   Drinks caffeine   One story home   Social Determinants of Health   Financial Resource Strain: Low Risk  (06/25/2022)   Overall Financial Resource Strain (CARDIA)    Difficulty of Paying Living Expenses: Not hard at all   Food Insecurity: No Food Insecurity (06/25/2022)   Hunger Vital Sign    Worried About Running Out of Food in the Last Year: Never true    Ran Out of Food in the Last Year: Never true  Transportation Needs: No Transportation Needs (06/25/2022)   PRAPARE - Administrator, Civil Service (Medical): No    Lack of Transportation (Non-Medical): No  Physical Activity: Insufficiently Active (06/25/2022)   Exercise Vital Sign    Days of Exercise per Week: 2 days    Minutes of Exercise per Session: 30 min  Stress: No Stress Concern Present (06/25/2022)   [employer] of Occupational Health - Occupational Stress Questionnaire    Feeling of Stress : Not at all  Social Connections: Socially Integrated (  06/25/2022)   Social Connection and Isolation Panel [NHANES]    Frequency of Communication with Friends and Family: More than three times a week    Frequency of Social Gatherings with Friends and Family: More than three times a week    Attends Religious Services: More than 4 times per year    Active Member of [employer] or Organizations: Yes    Attends Engineer, structural: More than 4 times per year    Marital Status: Married  Catering manager Violence: Not At Risk (06/25/2022)   Humiliation, Afraid, Rape, and Kick questionnaire    Fear of Current or Ex-Partner: No    Emotionally Abused: No    Physically Abused: No    Sexually Abused: No    Current Outpatient Medications on File Prior to Visit  Medication Sig Dispense Refill   ALPRAZolam (XANAX) 0.5 MG tablet Take 1 tablet (0.5 mg total) by mouth daily as needed for anxiety. 20 tablet 0   Calcium 500-100 MG-UNIT CHEW Chew by mouth.     Cholecalciferol (VITAMIN D) 50 MCG (2000 UT) tablet Take 4,000 Units by mouth daily.     citalopram (CELEXA) 20 MG tablet TAKE 1 TABLET BY MOUTH DAILY 90 tablet 0   Cranberry 400 MG CAPS Take by mouth daily.      famotidine (PEPCID) 20 MG tablet Take 1 tablet (20 mg total) by mouth at bedtime. 30  tablet 3   gabapentin (NEURONTIN) 300 MG capsule TAKE 3 CAPSULES BY MOUTH 3  TIMES DAILY 810 capsule 1   meloxicam (MOBIC) 15 MG tablet TAKE 1 TABLET BY MOUTH DAILY  WITH FOOD AS NEEDED FOR PAIN 100 tablet 0   Multiple Vitamins-Minerals (BARIATRIC MULTIVITAMINS/IRON) CAPS Take by mouth daily.     No current facility-administered medications on file prior to visit.    No Known Allergies    Review of Systems Constitutional: No recent fever/chills/sweats Respiratory: No recent cough/bronchitis Cardiovascular: No chest pain Gastrointestinal: No recent nausea/vomiting/diarrhea Genitourinary: No UTI symptoms Hematologic/lymphatic:No history of coagulopathy or recent blood thinner use    Objective:   There were no vitals taken for this visit. CONSTITUTIONAL: Well-developed, well-nourished female in no acute distress.  HENT:  Normocephalic, atraumatic, External right and left ear normal. Oropharynx is clear and moist EYES: Conjunctivae and EOM are normal. Pupils are equal, round, and reactive to light. No scleral icterus.  NECK: Normal range of motion, supple, no masses SKIN: Skin is warm and dry. No rash noted. Not diaphoretic. No erythema. No pallor. NEUROLOGIC: Alert and oriented to person, place, and time. Normal reflexes, muscle tone coordination. No cranial nerve deficit noted. PSYCHIATRIC: Normal mood and affect. Normal behavior. Normal judgment and thought content. CARDIOVASCULAR: Normal heart rate noted, regular rhythm RESPIRATORY: Effort and breath sounds normal, no problems with respiration noted ABDOMEN: Soft, nontender, nondistended. PELVIC: Deferred MUSCULOSKELETAL: Normal range of motion. No edema and no tenderness. 2+ distal pulses.    Labs:  Lab Results  Component Value Date   WBC 6.4 09/11/2022   HGB 13.1 09/11/2022   HCT 41.3 09/11/2022   MCV 87.6 09/11/2022   PLT 238.0 09/11/2022     Imaging Studies:  CLINICAL DATA:  IUD position and endometrial  thickness   EXAM: ULTRASOUND PELVIS TRANSVAGINAL   TECHNIQUE: Transvaginal ultrasound examination of the pelvis was performed including evaluation of the uterus, ovaries, adnexal regions, and pelvic cul-de-sac.   COMPARISON:  None Available.   FINDINGS: Uterus   Measurements: 7.1 x 3.1 x 4.2 cm = volume:  48 mL. No fibroids or other mass visualized.   Endometrium   Thickness: 4 mm. IUD difficult to visualized. Appears to be within the upper uterine segment, T arms poorly visual ball on transverse images.   Right ovary   Measurements: 2.5 x 1.4 x 1.9 cm = volume: 3.5 mL. Cyst measuring 13 x 11 x 11 mm   Left ovary   Not seen   Other findings:  No abnormal free fluid   IMPRESSION: 1. Difficult visualization of IUD, appears to be within the upper uterine segment endometrium 2. Normal postmenopausal endometrial thickness of 4 mm 3. 13 mm right ovarian cyst. No followup imaging recommended. Note: This recommendation does not apply to premenarchal patients or to those with increased risk (genetic, family history, elevated tumor markers or other high-risk factors) of ovarian cancer. Reference: Radiology 2019 Nov; 293(2):359-371.     Electronically Signed   By: Jasmine Pang M.D.   On: 10/28/2021 20:06  Assessment:    Lost IUD threads Pelvic pain History of endometrial hyperplasia   Plan:   Counseling: Procedure, risks, reasons, benefits and complications (including injury to bowel, bladder, major blood vessel, ureter, bleeding, possibility of transfusion, infection, or fistula formation) reviewed in detail. Likelihood of success in alleviating the patient's condition was discussed. Routine postoperative instructions will be reviewed with the patient and her family in detail after surgery.  The patient concurred with the proposed plan, giving informed written consent for the surgery.   Preop testing ordered. Instructions reviewed, including NPO after midnight.      Hildred Laser, MD [facility]

## 2022-09-28 MED ORDER — ACETAMINOPHEN 500 MG PO TABS
1000.0000 mg | ORAL_TABLET | ORAL | Status: AC
  Administered 2022-09-29: 1000 mg via ORAL

## 2022-09-28 MED ORDER — ORAL CARE MOUTH RINSE: 15.0000 mL | Freq: Once | OROMUCOSAL | Status: AC

## 2022-09-28 MED ORDER — CELECOXIB 200 MG PO CAPS
400.0000 mg | ORAL_CAPSULE | ORAL | Status: AC
  Administered 2022-09-29: 400 mg via ORAL

## 2022-09-28 MED ORDER — CHLORHEXIDINE GLUCONATE 0.12 % MT SOLN
15.0000 mL | Freq: Once | OROMUCOSAL | Status: AC
  Administered 2022-09-29: 15 mL via OROMUCOSAL

## 2022-09-28 MED ORDER — LACTATED RINGERS IV SOLN: INTRAVENOUS | Status: DC

## 2022-09-29 ENCOUNTER — Encounter: Payer: Self-pay | Admitting: Obstetrics and Gynecology

## 2022-09-29 ENCOUNTER — Ambulatory Visit
Admission: RE | Admit: 2022-09-29 | Payer: Medicare Other | Source: Home / Self Care | Admitting: Obstetrics and Gynecology

## 2022-09-29 ENCOUNTER — Encounter
Admission: RE | Disposition: A | Discharge status: Home / Self Care | Payer: Self-pay | Source: Home / Self Care | Attending: Obstetrics and Gynecology

## 2022-09-29 ENCOUNTER — Ambulatory Visit: Payer: Medicare Other | Admitting: Anesthesiology

## 2022-09-29 ENCOUNTER — Other Ambulatory Visit: Payer: Self-pay

## 2022-09-29 DIAGNOSIS — I10 Essential (primary) hypertension: Secondary | ICD-10-CM | POA: Insufficient documentation

## 2022-09-29 DIAGNOSIS — L728 Other follicular cysts of the skin and subcutaneous tissue: Secondary | ICD-10-CM | POA: Diagnosis not present

## 2022-09-29 DIAGNOSIS — T8332XA Displacement of intrauterine contraceptive device, initial encounter: Secondary | ICD-10-CM | POA: Insufficient documentation

## 2022-09-29 DIAGNOSIS — Z30432 Encounter for removal of intrauterine contraceptive device: Secondary | ICD-10-CM

## 2022-09-29 DIAGNOSIS — F419 Anxiety disorder, unspecified: Secondary | ICD-10-CM | POA: Insufficient documentation

## 2022-09-29 DIAGNOSIS — Z780 Asymptomatic menopausal state: Secondary | ICD-10-CM | POA: Diagnosis not present

## 2022-09-29 DIAGNOSIS — L720 Epidermal cyst: Secondary | ICD-10-CM | POA: Diagnosis not present

## 2022-09-29 DIAGNOSIS — T8332XD Displacement of intrauterine contraceptive device, subsequent encounter: Secondary | ICD-10-CM | POA: Diagnosis not present

## 2022-09-29 DIAGNOSIS — R102 Pelvic and perineal pain: Secondary | ICD-10-CM | POA: Insufficient documentation

## 2022-09-29 DIAGNOSIS — Y768 Miscellaneous obstetric and gynecological devices associated with adverse incidents, not elsewhere classified: Secondary | ICD-10-CM | POA: Diagnosis not present

## 2022-09-29 DIAGNOSIS — N8500 Endometrial hyperplasia, unspecified: Secondary | ICD-10-CM | POA: Diagnosis not present

## 2022-09-29 DIAGNOSIS — Z01818 Encounter for other preprocedural examination: Secondary | ICD-10-CM

## 2022-09-29 DIAGNOSIS — F32A Depression, unspecified: Secondary | ICD-10-CM | POA: Insufficient documentation

## 2022-09-29 DIAGNOSIS — G4730 Sleep apnea, unspecified: Secondary | ICD-10-CM | POA: Insufficient documentation

## 2022-09-29 DIAGNOSIS — R208 Other disturbances of skin sensation: Secondary | ICD-10-CM | POA: Diagnosis not present

## 2022-09-29 DIAGNOSIS — L538 Other specified erythematous conditions: Secondary | ICD-10-CM | POA: Diagnosis not present

## 2022-09-29 HISTORY — PX: HYSTEROSCOPY WITH D & C: SHX1775

## 2022-09-29 HISTORY — PX: IUD REMOVAL: SHX5392

## 2022-09-29 SURGERY — DILATATION AND CURETTAGE /HYSTEROSCOPY
Anesthesia: General

## 2022-09-29 MED ORDER — ONDANSETRON HCL 4 MG/2ML IJ SOLN
INTRAMUSCULAR | Status: DC | PRN
  Administered 2022-09-29: 4 mg via INTRAVENOUS

## 2022-09-29 MED ORDER — DEXAMETHASONE SODIUM PHOSPHATE 10 MG/ML IJ SOLN
INTRAMUSCULAR | Status: AC
  Filled 2022-09-29: qty 1

## 2022-09-29 MED ORDER — OXYCODONE HCL 5 MG PO TABS
5.0000 mg | ORAL_TABLET | Freq: Once | ORAL | Status: AC
  Administered 2022-09-29: 5 mg via ORAL

## 2022-09-29 MED ORDER — LACTATED RINGERS IV SOLN: INTRAVENOUS | Status: DC | PRN

## 2022-09-29 MED ORDER — ACETAMINOPHEN 500 MG PO TABS
ORAL_TABLET | ORAL | Status: AC
  Filled 2022-09-29: qty 2

## 2022-09-29 MED ORDER — OXYCODONE HCL 5 MG PO TABS
ORAL_TABLET | ORAL | Status: AC
  Filled 2022-09-29: qty 1

## 2022-09-29 MED ORDER — FENTANYL CITRATE (PF) 100 MCG/2ML IJ SOLN: 25.0000 ug | INTRAMUSCULAR | Status: DC | PRN

## 2022-09-29 MED ORDER — ACETAMINOPHEN ER 650 MG PO TBCR: 650.0000 mg | EXTENDED_RELEASE_TABLET | Freq: Three times a day (TID) | ORAL | 0 refills | Status: AC | PRN

## 2022-09-29 MED ORDER — LIDOCAINE HCL (CARDIAC) PF 100 MG/5ML IV SOSY
PREFILLED_SYRINGE | INTRAVENOUS | Status: DC | PRN
  Administered 2022-09-29: 60 mg via INTRAVENOUS

## 2022-09-29 MED ORDER — CELECOXIB 200 MG PO CAPS
ORAL_CAPSULE | ORAL | Status: AC
  Filled 2022-09-29: qty 2

## 2022-09-29 MED ORDER — FENTANYL CITRATE (PF) 100 MCG/2ML IJ SOLN
INTRAMUSCULAR | Status: DC | PRN
  Administered 2022-09-29 (×4): 25 ug via INTRAVENOUS

## 2022-09-29 MED ORDER — MIDAZOLAM HCL 2 MG/2ML IJ SOLN
INTRAMUSCULAR | Status: DC | PRN
  Administered 2022-09-29: 2 mg via INTRAVENOUS

## 2022-09-29 MED ORDER — FENTANYL CITRATE (PF) 100 MCG/2ML IJ SOLN
INTRAMUSCULAR | Status: AC
  Filled 2022-09-29: qty 2

## 2022-09-29 MED ORDER — DEXAMETHASONE SODIUM PHOSPHATE 10 MG/ML IJ SOLN
INTRAMUSCULAR | Status: DC | PRN
  Administered 2022-09-29: 5 mg via INTRAVENOUS

## 2022-09-29 MED ORDER — MIDAZOLAM HCL 2 MG/2ML IJ SOLN
INTRAMUSCULAR | Status: AC
  Filled 2022-09-29: qty 2

## 2022-09-29 MED ORDER — CHLORHEXIDINE GLUCONATE 0.12 % MT SOLN
OROMUCOSAL | Status: AC
  Filled 2022-09-29: qty 15

## 2022-09-29 MED ORDER — EPHEDRINE SULFATE (PRESSORS) 50 MG/ML IJ SOLN
INTRAMUSCULAR | Status: DC | PRN
  Administered 2022-09-29 (×2): 10 mg via INTRAVENOUS

## 2022-09-29 MED ORDER — PROPOFOL 10 MG/ML IV BOLUS
INTRAVENOUS | Status: DC | PRN
  Administered 2022-09-29: 200 mg via INTRAVENOUS

## 2022-09-29 MED ORDER — ONDANSETRON HCL 4 MG/2ML IJ SOLN
INTRAMUSCULAR | Status: AC
  Filled 2022-09-29: qty 2

## 2022-09-29 MED ORDER — DROPERIDOL 2.5 MG/ML IJ SOLN: 0.6250 mg | Freq: Once | INTRAMUSCULAR | Status: DC | PRN

## 2022-09-29 MED ORDER — PROPOFOL 10 MG/ML IV BOLUS
INTRAVENOUS | Status: AC
  Filled 2022-09-29: qty 20

## 2022-09-29 SURGICAL SUPPLY — 29 items
CUP MEDICINE 2OZ PLAST GRAD ST (MISCELLANEOUS) ×1 IMPLANT
DEVICE MYOSURE LITE (MISCELLANEOUS) IMPLANT
DRAPE UNDER BUTTOCK W/FLU (DRAPES) ×1 IMPLANT
DRSG TELFA 3X8 NADH STRL (GAUZE/BANDAGES/DRESSINGS) ×1 IMPLANT
ELECT REM PT RETURN 9FT ADLT (ELECTROSURGICAL) ×1
ELECTRODE REM PT RTRN 9FT ADLT (ELECTROSURGICAL) ×1 IMPLANT
GLOVE BIO SURGEON STRL SZ 6.5 (GLOVE) ×1 IMPLANT
GLOVE INDICATOR 7.0 STRL GRN (GLOVE) ×1 IMPLANT
GOWN STRL REUS W/ TWL LRG LVL3 (GOWN DISPOSABLE) ×2 IMPLANT
GOWN STRL REUS W/TWL LRG LVL3 (GOWN DISPOSABLE) ×2
HANDPIECE ABLA MINERVA ENDO (MISCELLANEOUS) IMPLANT
IV NS IRRIG 3000ML ARTHROMATIC (IV SOLUTION) ×1 IMPLANT
KIT PROCEDURE FLUENT (KITS) IMPLANT
KIT TURNOVER CYSTO (KITS) ×1 IMPLANT
MANIFOLD NEPTUNE II (INSTRUMENTS) ×1 IMPLANT
NDL HYPO 22X1.5 SAFETY MO (MISCELLANEOUS) IMPLANT
NEEDLE HYPO 22X1.5 SAFETY MO (MISCELLANEOUS) IMPLANT
PACK DNC HYST (MISCELLANEOUS) ×1 IMPLANT
PAD OB MATERNITY 4.3X12.25 (PERSONAL CARE ITEMS) ×1 IMPLANT
PAD PREP OB/GYN DISP 24X41 (PERSONAL CARE ITEMS) ×1 IMPLANT
SCRUB CHG 4% DYNA-HEX 4OZ (MISCELLANEOUS) ×1 IMPLANT
SEAL ROD LENS SCOPE MYOSURE (ABLATOR) ×1 IMPLANT
SET CYSTO W/LG BORE CLAMP LF (SET/KITS/TRAYS/PACK) IMPLANT
SOL PREP PVP 2OZ (MISCELLANEOUS) ×1
SOLUTION PREP PVP 2OZ (MISCELLANEOUS) ×1 IMPLANT
SYR 10ML LL (SYRINGE) IMPLANT
TOWEL OR 17X26 4PK STRL BLUE (TOWEL DISPOSABLE) ×1 IMPLANT
TRAP FLUID SMOKE EVACUATOR (MISCELLANEOUS) ×1 IMPLANT
WATER STERILE IRR 500ML POUR (IV SOLUTION) ×1 IMPLANT

## 2022-09-29 NOTE — Interval H&P Note (Signed)
History and Physical Interval Note:  09/29/2022 7:30 AM  Katrina Huffman  has presented today for surgery, with the diagnosis of Lost IUD threads, pelvic pain, history of endometrial hyperplasia.  The various methods of treatment have been discussed with the patient and family. After consideration of risks, benefits and other options for treatment, the patient has consented to  Procedure(s): HYSTEROSCOPY D&C, WITH IUD REMOVAL (N/A), INTRAUTERINE DEVICE (IUD) REMOVAL (N/A) as a surgical intervention.  The patient's history has been reviewed, patient examined, no change in status, stable for surgery.  I have reviewed the patient's chart and labs.  Questions were answered to the patient's satisfaction.    Current vital signs stable:  Blood pressure 133/81, pulse 78, temperature 98.2 F (36.8 C), temperature source Temporal, resp. rate 18, height 5' 4.25" (1.632 m), weight 104.3 kg, SpO2 96%.   Hildred Laser, MD [facility] at [facility]

## 2022-09-29 NOTE — Discharge Instructions (Signed)

## 2022-09-29 NOTE — Transfer of Care (Signed)
Immediate Anesthesia Transfer of Care Note  Patient: Katrina Huffman  Procedure(s) Performed: HYSTEROSCOPY D&C, WITH IUD REMOVAL INTRAUTERINE DEVICE (IUD) REMOVAL  Patient Location: PACU  Anesthesia Type:General  Level of Consciousness: drowsy  Airway & Oxygen Therapy: Patient Spontanous Breathing and Patient connected to face mask oxygen  Post-op Assessment: Report given to RN and Post -op Vital signs reviewed and stable  Post vital signs: Reviewed and stable  Last Vitals:  Vitals Value Taken Time  BP 144/96 09/29/22 0847  Temp    Pulse 78 09/29/22 0850  Resp    SpO2 96 % 09/29/22 0850  Vitals shown include unfiled device data.  Last Pain:  Vitals:   09/29/22 0621  TempSrc: Temporal  PainSc: 4       Patients Stated Pain Goal: 0 (09/29/22 4132)  Complications: No notable events documented.

## 2022-09-29 NOTE — Anesthesia Procedure Notes (Signed)
Procedure Name: LMA Insertion Date/Time: 09/29/2022 7:46 AM  Performed by: Monico Hoar, CRNAPre-anesthesia Checklist: Patient identified, Patient being monitored, Timeout performed, Emergency Drugs available and Suction available Patient Re-evaluated:Patient Re-evaluated prior to induction Oxygen Delivery Method: Circle system utilized Preoxygenation: Pre-oxygenation with 100% oxygen Induction Type: IV induction Ventilation: Mask ventilation without difficulty LMA: LMA inserted LMA Size: 4.0 Tube type: Oral Number of attempts: 1 Placement Confirmation: positive ETCO2 and breath sounds checked- equal and bilateral Tube secured with: Tape Dental Injury: Teeth and Oropharynx as per pre-operative assessment

## 2022-09-29 NOTE — Anesthesia Preprocedure Evaluation (Signed)
Anesthesia Evaluation  Patient identified by MRN, date of birth, ID band Patient awake    Reviewed: Allergy & Precautions, H&P , NPO status , Patient's Chart, lab work & pertinent test results  History of Anesthesia Complications (+) PONV and history of anesthetic complications  Airway Mallampati: II  TM Distance: >3 FB Neck ROM: full    Dental  (+) Teeth Intact, Dental Advidsory Given   Pulmonary shortness of breath and with exertion, sleep apnea , neg COPD, neg recent URI   breath sounds clear to auscultation       Cardiovascular Exercise Tolerance: Good hypertension, (-) angina (-) Past MI and (-) Cardiac Stents (-) dysrhythmias (-) Valvular Problems/Murmurs Rhythm:regular Rate:Normal     Neuro/Psych  PSYCHIATRIC DISORDERS Anxiety Depression    negative neurological ROS     GI/Hepatic Neg liver ROS, hiatal hernia,neg GERD  ,,  Endo/Other  negative endocrine ROS    Renal/GU negative Renal ROS  negative genitourinary   Musculoskeletal   Abdominal   Peds  Hematology negative hematology ROS (+)   Anesthesia Other Findings Past Medical History: No date: Anxiety No date: Arthritis     Comment:  hands, knees, back No date: Depression No date: Edema     Comment:  left leg knee to foot, since veein stripping No date: History of chicken pox No date: History of hiatal hernia No date: Hypertension No date: PONV (postoperative nausea and vomiting)     Comment:  PONV after knee arthroplasty No date: Scoliosis     Comment:  sees chiropracter monthly No date: Shortness of breath dyspnea     Comment:  "out of shape" No date: Sleep apnea     Comment:  cpap  Past Surgical History: No date: BARIATRIC SURGERY 1999: BREAST SURGERY     Comment:  breast biopsy 2000: BUNIONECTOMY 1983 1985 : CESAREAN SECTION No date: COLONOSCOPY 04/30/2015: COLONOSCOPY WITH PROPOFOL; N/A     Comment:  Procedure: COLONOSCOPY WITH  PROPOFOL;  Surgeon: Midge Minium, MD;  Location: [facility];  Service:               Endoscopy;  Laterality: N/A; No date: DILATION AND CURETTAGE OF UTERUS     Comment:  x2 last one 1/17 No date: EYE SURGERY No date: JOINT REPLACEMENT     Comment:  LTKA-3 yrs ago No date: JOINT REPLACEMENT     Comment:  TOTAL SHOULDER ARTHROPLASTY 05/09/2015: KNEE ARTHROSCOPY; Left     Comment:  Procedure: ARTHROSCOPY LEFT KNEE WITH SYNOVECTOMY;                Surgeon: Ollen Gross, MD;  Location: [facility];  Service:               Orthopedics;  Laterality: Left; 04/27/2017: KNEE ARTHROSCOPY; Left     Comment:  Procedure: Left knee arthroscopy; synovectomy;  Surgeon:              Ollen Gross, MD;  Location: [facility];  Service:               Orthopedics;  Laterality: Left; 2009: KNEE SURGERY; Left     Comment:  x 2-meniscus, debridement No date: LASIK 04/30/2015: POLYPECTOMY     Comment:  Procedure: POLYPECTOMY;  Surgeon: Midge Minium, MD;                Location: [facility];  Service: Endoscopy;; 2007: TONSILLECTOMY  AND ADENOIDECTOMY 04/27/2017: TOTAL KNEE ARTHROPLASTY; Right     Comment:  Procedure: RIGHT TOTAL KNEE ARTHROPLASTY;  Surgeon:               Ollen Gross, MD;  Location: [facility];  Service:               Orthopedics;  Laterality: Right; 04/16/2016: TOTAL KNEE ARTHROPLASTY WITH REVISION COMPONENTS; Left     Comment:  Procedure: LEFT TOTAL KNEE ARTHROPLASTY WITH POLY               REVISION;  Surgeon: Ollen Gross, MD;  Location: [facility];              Service: Orthopedics;  Laterality: Left;  with abductor               block 10/16/2016: TOTAL SHOULDER ARTHROPLASTY; Right 10/16/2016: TOTAL SHOULDER ARTHROPLASTY; Right     Comment:  Procedure: TOTAL SHOULDER ARTHROPLASTY;  Surgeon:               Francena Hanly, MD;  Location: [facility];  Service:               Orthopedics;  Laterality: Right; 1986 & 1991: TUBAL LIGATION 2000: VEIN LIGATION AND STRIPPING      Reproductive/Obstetrics negative OB ROS                             Anesthesia Physical Anesthesia Plan  ASA: 2  Anesthesia Plan: General   Post-op Pain Management:    Induction: Intravenous  PONV Risk Score and Plan: 4 or greater and Ondansetron, Dexamethasone, Midazolam and Treatment may vary due to age or medical condition  Airway Management Planned: LMA  Additional Equipment:   Intra-op Plan:   Post-operative Plan: Extubation in OR  Informed Consent: I have reviewed the patients History and Physical, chart, labs and discussed the procedure including the risks, benefits and alternatives for the proposed anesthesia with the patient or authorized representative who has indicated his/her understanding and acceptance.     Dental Advisory Given  Plan Discussed with: Anesthesiologist, CRNA and Surgeon  Anesthesia Plan Comments:         Anesthesia Quick Evaluation

## 2022-09-29 NOTE — Op Note (Signed)
Procedure(s): EXAM UNDER ANESTHESIA WITH IUD REMOVAL, DILATION AND CURETTAGE Procedure Note  [name] female 67 y.o. 09/29/2022  Indications: The patient is a 67 y.o. postmenopausal female with a history of endometrial hyperplasia, IUD with lost threads, and pelvic pain.   Pre-operative Diagnosis: Lost IUD  threads, endometrial  hyperplasia, pelvic pain  Post-operative Diagnosis: Same, except IUD threads identified  Surgeon: Hildred Laser, MD  Assistants:  None.   Anesthesia: General endotracheal anesthesia  Findings: The uterus was sounded to 7 cm Scant endometrial cells on curretting  Procedure Details: The patient was seen in the Holding Room. The risks, benefits, complications, treatment options, and expected outcomes were discussed with the patient.  The patient concurred with the proposed plan, giving informed consent.  The site of surgery properly noted/marked. The patient was taken to the Operating Room, identified as Katrina Huffman and the procedure verified as Procedure(s) (LRB): EXAM UNDER ANESTHESIA, IUD REMOVAL (N/A), DILATION AND CURETTAGE (N/A). A Time Out was held and the above information confirmed.  She was then placed under general anesthesia without difficulty. She was placed in the dorsal lithotomy position, and was prepped and draped in a sterile manner.  A straight catheterization was performed. A sterile speculum was inserted into the vagina and the cervix was grasped at the anterior lip using a single-toothed tenaculum.  The IUD threads were visualized, ~ 2 cm in length. The threads were grasped using a Kelly clamp and removed from the uterus intact without difficulty.  Next, cervical dilation was performed to accommodate a Novack curette.  A sharp curettage was performed with scant cells retrieved. Specimen sent to pathology. The tenaculum was removed from the cervix, hemostasis of tenaculum sites noted.  The speculum was removed.   The patient tolerated  the procedure well. She was taken to the PACU in stable condition. Sponge and instrument counts were correct x 2 at the end of the procedure.    Estimated Blood Loss:  10 ml      Drains: straight catheterization prior to procedure with 50 ml of clear urine         Total IV Fluids:  60 ml  Specimens: Endometrial curettings         Implants: None         Complications:  None; patient tolerated the procedure well.         Disposition: PACU - hemodynamically stable.         Condition: stable   Hildred Laser, MD [facility] at [facility]

## 2022-09-30 ENCOUNTER — Ambulatory Visit
Admission: RE | Admit: 2022-09-30 | Discharge: 2022-09-30 | Disposition: A | Discharge status: Home / Self Care | Payer: Medicare Other | Source: Ambulatory Visit | Attending: Family Medicine | Admitting: Family Medicine

## 2022-09-30 DIAGNOSIS — Z780 Asymptomatic menopausal state: Secondary | ICD-10-CM | POA: Diagnosis not present

## 2022-10-01 ENCOUNTER — Encounter: Payer: Medicare Other | Admitting: Dermatology

## 2022-10-01 NOTE — Anesthesia Postprocedure Evaluation (Signed)
Anesthesia Post Note  Patient: Katrina Huffman  Procedure(s) Performed: DILATATION AND CURETTAGE INTRAUTERINE DEVICE (IUD) REMOVAL  Patient location during evaluation: PACU Anesthesia Type: General Level of consciousness: awake and alert Pain management: pain level controlled Vital Signs Assessment: post-procedure vital signs reviewed and stable Respiratory status: spontaneous breathing, nonlabored ventilation, respiratory function stable and patient connected to nasal cannula oxygen Cardiovascular status: blood pressure returned to baseline and stable Postop Assessment: no apparent nausea or vomiting Anesthetic complications: no   No notable events documented.   Last Vitals:  Vitals:   09/29/22 0915 09/29/22 0936  BP: (!) 143/72 (!) 152/84  Pulse: 64 68  Resp: 18 16  Temp: (!) 36.2 C 36.7 C  SpO2: 98% 95%    Last Pain:  Vitals:   09/29/22 0936  TempSrc: Temporal  PainSc: 2                  Lenard Simmer

## 2022-10-08 ENCOUNTER — Encounter: Payer: Self-pay | Admitting: Obstetrics and Gynecology

## 2022-10-08 ENCOUNTER — Ambulatory Visit (INDEPENDENT_AMBULATORY_CARE_PROVIDER_SITE_OTHER): Payer: Medicare Other | Admitting: Obstetrics and Gynecology

## 2022-10-08 VITALS — BP 151/81 | HR 72 | Wt 234.8 lb

## 2022-10-08 DIAGNOSIS — Z9889 Other specified postprocedural states: Secondary | ICD-10-CM

## 2022-10-08 DIAGNOSIS — Z8742 Personal history of other diseases of the female genital tract: Secondary | ICD-10-CM

## 2022-10-08 DIAGNOSIS — Z4889 Encounter for other specified surgical aftercare: Secondary | ICD-10-CM

## 2022-10-08 NOTE — Progress Notes (Signed)
    OBSTETRICS/GYNECOLOGY POST-OPERATIVE CLINIC VISIT  Subjective:     Katrina Huffman is a 67 y.o. female who presents to the clinic 2 weeks status post DILATATION AND CURETTAGE INTRAUTERINE DEVICE (IUD) REMOVAL  for Lost IUD threads, history of endometrial hyperplasia . Eating a regular diet without difficulty. Bowel movements are normal. The patient is not having any pain.  The following portions of the patient's history were reviewed and updated as appropriate: allergies, current medications, and problem list.  Review of Systems Pertinent items noted in HPI and remainder of comprehensive ROS otherwise negative.   Objective:   BP (!) 148/85   Pulse 76   Wt 234 lb 12.8 oz (106.5 kg)   BMI 39.99 kg/m  Body mass index is 39.99 kg/m.   General:  alert and no distress  Abdomen: soft, bowel sounds active, non-tender  Pelvis:  deferred    Pathology:  Endometrium, curettage - SCANT FRAGMENTS OF BENIGN INACTIVE ENDOMETRIUM WITH STROMAL FEATURES COMPATIBLE WITH EXOGENOUS PROGESTIN EFFECT - SCANT FRAGMENTS OF BENIGN ENDOCERVICAL MUCOSA AND SQUAMOUS MUCOSA - PREDOMINANTLY MUCOINFLAMMATORY AND HEMORRHAGIC DEBRIS    Assessment:   Postoperative visit s/p  DILATATION AND CURETTAGE, INTRAUTERINE DEVICE (IUD) REMOVAL  History of endometrial hyperplasia   Plan:   1.  Doing well postoperatively.   2.  Operative findings again reviewed. Pathology report discussed.  Recommend follow-up in 4 months to repeat ultrasound to assess uterine lining due to history of endometrial hyperplasia now that her IUD has been removed. 3. Activity restrictions: none 4. Anticipated return to work:  Now if applicable . 5. Follow up: 4  months  for annual exam and pelvic ultrasound   Hildred Laser, MD [facility]

## 2022-10-22 ENCOUNTER — Other Ambulatory Visit: Payer: Self-pay | Admitting: Family Medicine

## 2022-10-22 DIAGNOSIS — F419 Anxiety disorder, unspecified: Secondary | ICD-10-CM

## 2022-10-31 ENCOUNTER — Ambulatory Visit (INDEPENDENT_AMBULATORY_CARE_PROVIDER_SITE_OTHER)
Admission: RE | Admit: 2022-10-31 | Discharge: 2022-10-31 | Disposition: A | Discharge status: Home / Self Care | Payer: Medicare Other | Source: Ambulatory Visit | Attending: Family Medicine | Admitting: Family Medicine

## 2022-10-31 ENCOUNTER — Ambulatory Visit (INDEPENDENT_AMBULATORY_CARE_PROVIDER_SITE_OTHER): Payer: Medicare Other | Admitting: Family Medicine

## 2022-10-31 ENCOUNTER — Encounter: Payer: Self-pay | Admitting: Family Medicine

## 2022-10-31 VITALS — BP 130/78 | HR 68 | Temp 100.0°F | Ht 64.25 in | Wt 227.0 lb

## 2022-10-31 DIAGNOSIS — R053 Chronic cough: Secondary | ICD-10-CM

## 2022-10-31 DIAGNOSIS — M25532 Pain in left wrist: Secondary | ICD-10-CM | POA: Insufficient documentation

## 2022-10-31 DIAGNOSIS — Z96611 Presence of right artificial shoulder joint: Secondary | ICD-10-CM | POA: Diagnosis not present

## 2022-10-31 DIAGNOSIS — Z832 Family history of diseases of the blood and blood-forming organs and certain disorders involving the immune mechanism: Secondary | ICD-10-CM | POA: Diagnosis not present

## 2022-10-31 MED ORDER — PREDNISONE 20 MG PO TABS: 20.0000 mg | ORAL_TABLET | Freq: Every day | ORAL | 0 refills | Status: DC

## 2022-10-31 MED ORDER — BENZONATATE 200 MG PO CAPS: 200.0000 mg | ORAL_CAPSULE | Freq: Three times a day (TID) | ORAL | 1 refills | Status: DC | PRN

## 2022-10-31 MED ORDER — PROMETHAZINE-DM 6.25-15 MG/5ML PO SYRP: 5.0000 mL | ORAL_SOLUTION | Freq: Every evening | ORAL | 0 refills | Status: DC | PRN

## 2022-10-31 NOTE — Patient Instructions (Addendum)
I put the referral in for a hand specialist  Please let us know if you don't hear in 1-2 weeks   I think you may have a post viral cyclic cough  Take the tessalon every 8 hours as needed for cough  Try the promethazine dm at night (caution of sedation)  Prednisone 20 mg daily in am for 5 days  We are trying to stop the cough cycle  We will contact you re: xray result   From there let us know if cough does not improve

## 2022-10-31 NOTE — Progress Notes (Unsigned)
Subjective:    Patient ID: Katrina Huffman, female    DOB: 03-Sep-1955, 67 y.o.   MRN: [id number]  HPI  Wt Readings from Last 3 Encounters:  10/31/22 227 lb (103 kg)  10/08/22 234 lb 12.8 oz (106.5 kg)  09/29/22 230 lb (104.3 kg)   38.66 kg/m  Vitals:   10/31/22 1534  BP: 130/78  Pulse: 68  Temp: 100 F (37.8 C)  SpO2: 97%    Pt presents for chronic cough  Also left wrist pain   Chronic cough  Since march  Fam history of sarcoidosis  We prescription pepcid a month ago to see if GERD was playing a role   No difference in cough   Cough happens randomly when she does not expect it  It can be triggered by laughing but it does not have to be  Not worse day vs night  Does not feel phlegm in chest  Does occational clear throat -does not spit anything out  No wheezing  Chest does not feel tight  No fever  No rash   On gabapentn - high dose for different problem but not helping the cough  For trigeminal neuralgia   Over the counter  Only lozenges - helps for just a second  A sip of water will soothe it    DG Chest 2 View  Result Date: 10/31/2022 CLINICAL DATA:  Chronic cough.  Family history of sarcoidosis. EXAM: CHEST - 2 VIEW COMPARISON:  Chest radiographs 04/21/2022 FINDINGS: The cardiomediastinal silhouette is unchanged normal heart size. No airspace consolidation, edema, pleural effusion, or pneumothorax is identified. Moderate thoracolumbar scoliosis and a right shoulder arthroplasty are again noted. IMPRESSION: No active cardiopulmonary disease. Electronically Signed   By: Sebastian Ache M.D.   On: 10/31/2022 16:50      Left wrist pain  Left handed  Her ulna process is larger  Some numbness in the left 4,5th fingers /on and off  Lot of cooking in July - had to use both hands on the knife  Thinks she has lost strength   Some arthritis pain in her hand        Patient Active Problem List   Diagnosis Date Noted   Wrist pain, left 10/31/2022    Osteoarthrosis, hand 09/15/2022   Chronic cough 04/21/2022   OSA on CPAP 06/10/2021   Prediabetes 06/10/2021   Scoliosis 06/10/2021   Facial paresthesia 12/06/2019   Personal history of colonic polyps    Endometrial hyperplasia 04/26/2019   Welcome to Medicare preventive visit 04/26/2019   OA (osteoarthritis) of knee 04/27/2017   Bariatric surgery status 03/06/2017   S/P shoulder replacement 10/16/2016   Esophagitis 10/02/2016   Failed total knee arthroplasty, 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   Failed total knee arthroplasty (HCC) 04/16/2016   Bilateral chronic knee pain 03/20/2016   Vitamin D deficiency 03/05/2016   Anxiety disorder 03/05/2016   Essential hypertension 03/05/2016   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   Special screening for malignant neoplasms, colon    Benign neoplasm of transverse colon    Postmenopausal bleeding 12/16/2014   Screening for lipoid disorders 06/18/2010   Routine general medical examination at a health care facility 06/18/2010   Morbid obesity (HCC) 06/18/2010   Past Medical History:  Diagnosis Date   Anxiety    Arthritis    hands, knees, back   Cataract    Depression    Facial paresthesia    12-15-2019  per pt just on left side of  face/ head ,  has intermittant tingling / numbness, scheduled for MRI 12-27-2019 by her pcp , told 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   History of 2019 novel coronavirus disease (COVID-19) 02/28/2019   positive results in care everywhere;  12-15-2019  per pt mild symptoms resolved in just over 2 wks with no residual   History of hypertension    per pt hx dx HTN prior to gastric bypass 12/ 2018,  since lost wt no issue   OSA (obstructive sleep apnea)    12-15-2019 per pt has not used cpap since 01/ 2021 stated does not need   PONV (postoperative nausea and vomiting)    PONV after knee arthroplasty   Right ureteral stone    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   Scoliosis    sees chiropracter  monthly   Sleep apnea    Wears glasses    Past Surgical History:  Procedure Laterality Date   BREAST SURGERY  1999   breast biopsy   BUNIONECTOMY  2000   CESAREAN SECTION  1983; 1985   COLONOSCOPY WITH PROPOFOL N/A 04/30/2015   Procedure: COLONOSCOPY WITH PROPOFOL;  Surgeon: Midge Minium, MD;  Location: [facility];  Service: Endoscopy;  Laterality: N/A;   COLONOSCOPY WITH PROPOFOL N/A 05/03/2019   Procedure: COLONOSCOPY WITH PROPOFOL;  Surgeon: Midge Minium, MD;  Location: [facility];  Service: Endoscopy;  Laterality: N/A;  patient was COVID POSITIVE on 02/26/2019   CYSTOSCOPY WITH RETROGRADE PYELOGRAM, URETEROSCOPY AND STENT PLACEMENT Right 12/22/2019   Procedure: CYSTOSCOPY WITH RIGHT RETROGRADE PYELOGRAM, URETEROSCOPY WITH HOLMIUM LASER , BASKET EXTRACTION OF STONES AND STENT PLACEMENT;  Surgeon: Crist Fat, MD;  Location: [facility];  Service: Urology;  Laterality: Right;   DIAGNOSTIC LAPAROSCOPY  01/19/2017   LOA w/ Gastropexy/ EGD   EXTRACORPOREAL SHOCK WAVE LITHOTRIPSY Right 08/22/2019   Procedure: RIGHT EXTRACORPOREAL SHOCK WAVE LITHOTRIPSY (ESWL);  Surgeon: Rene Paci, MD;  Location: [facility];  Service: Urology;  Laterality: Right;   EYE SURGERY     HYSTEROSCOPY WITH D & C  x2  last one 01/ 2017   HYSTEROSCOPY WITH D & C N/A 09/29/2022   Procedure: DILATATION AND CURETTAGE;  Surgeon: Hildred Laser, MD;  Location: [facility];  Service: Gynecology;  Laterality: N/A;   IUD REMOVAL N/A 09/29/2022   Procedure: INTRAUTERINE DEVICE (IUD) REMOVAL;  Surgeon: Hildred Laser, MD;  Location: [facility];  Service: Gynecology;  Laterality: N/A;   JOINT REPLACEMENT     KNEE ARTHROSCOPY Left 05/09/2015   Procedure: ARTHROSCOPY LEFT KNEE WITH SYNOVECTOMY;  Surgeon: Ollen Gross, MD;  Location: [facility];  Service: Orthopedics;  Laterality: Left;   KNEE ARTHROSCOPY Left 04/27/2017   Procedure: Left knee arthroscopy; synovectomy;   Surgeon: Ollen Gross, MD;  Location: [facility];  Service: Orthopedics;  Laterality: Left;   KNEE ARTHROSCOPY Left 2009   LAPAROSCOPIC GASTRIC SLEEVE RESECTION  01/14/2017   POLYPECTOMY  04/30/2015   Procedure: POLYPECTOMY;  Surgeon: Midge Minium, MD;  Location: [facility];  Service: Endoscopy;;   TONSILLECTOMY AND ADENOIDECTOMY  2007   TOTAL KNEE ARTHROPLASTY Right 04/27/2017   Procedure: RIGHT TOTAL KNEE ARTHROPLASTY;  Surgeon: Ollen Gross, MD;  Location: [facility];  Service: Orthopedics;  Laterality: Right;   TOTAL KNEE ARTHROPLASTY Left 2014   TOTAL KNEE ARTHROPLASTY WITH REVISION COMPONENTS Left 04/16/2016   Procedure: LEFT TOTAL KNEE ARTHROPLASTY WITH POLY REVISION;  Surgeon: Ollen Gross, MD;  Location: [facility];  Service: Orthopedics;  Laterality: Left;  with abductor  block   TOTAL SHOULDER ARTHROPLASTY Right 10/16/2016   TOTAL SHOULDER ARTHROPLASTY Right 10/16/2016   Procedure: TOTAL SHOULDER ARTHROPLASTY;  Surgeon: Francena Hanly, MD;  Location: [facility];  Service: Orthopedics;  Laterality: Right;   TUBAL LIGATION  1986 & 1991   VEIN LIGATION AND STRIPPING  2000   Social History   Tobacco Use   Smoking status: Never   Smokeless tobacco: Never  Vaping Use   Vaping status: Never Used  Substance Use Topics   Alcohol use: Not Currently    Comment: rare   Drug use: Never   Family History  Problem Relation Age of Onset   Arthritis Mother    Hypertension Mother    Varicose Veins Mother    Arthritis Father    Cancer Father        prostate CA   Hypertension Father    Breast cancer Paternal Aunt    Cancer Paternal Aunt        breast cancer   Arthritis Maternal Grandmother    Hypertension Maternal Grandmother    Hearing loss Maternal Grandmother    Vision loss Maternal Grandmother    Arthritis Maternal Grandfather    Hypertension Maternal Grandfather    Diabetes Maternal Grandfather    Diabetes Brother    Anxiety disorder Brother    Depression Brother    No Known  Allergies Current Outpatient Medications on File Prior to Visit  Medication Sig Dispense Refill   acetaminophen (TYLENOL 8 HOUR) 650 MG CR tablet Take 1 tablet (650 mg total) by mouth every 8 (eight) hours as needed for pain. 30 tablet 0   ALPRAZolam (XANAX) 0.5 MG tablet Take 1 tablet (0.5 mg total) by mouth daily as needed for anxiety. 20 tablet 0   Calcium 500-100 MG-UNIT CHEW Chew by mouth.     Cholecalciferol (VITAMIN D) 50 MCG (2000 UT) tablet Take 4,000 Units by mouth daily.     citalopram (CELEXA) 20 MG tablet TAKE 1 TABLET BY MOUTH DAILY 90 tablet 2   Cranberry 400 MG CAPS Take by mouth daily.      gabapentin (NEURONTIN) 300 MG capsule TAKE 3 CAPSULES BY MOUTH 3  TIMES DAILY 810 capsule 1   meloxicam (MOBIC) 15 MG tablet TAKE 1 TABLET BY MOUTH DAILY  WITH FOOD AS NEEDED FOR PAIN 100 tablet 0   Multiple Vitamins-Minerals (BARIATRIC MULTIVITAMINS/IRON) CAPS Take by mouth daily.     No current facility-administered medications on file prior to visit.    Review of Systems  Constitutional:  Negative for activity change, appetite change, fatigue, fever and unexpected weight change.  HENT:  Negative for congestion, ear pain, rhinorrhea, sinus pressure, sore throat and trouble swallowing.   Eyes:  Negative for pain, redness and visual disturbance.  Respiratory:  Positive for cough. Negative for apnea, choking, chest tightness, shortness of breath, wheezing and stridor.   Cardiovascular:  Negative for chest pain and palpitations.  Gastrointestinal:  Negative for abdominal pain, blood in stool, constipation and diarrhea.  Endocrine: Negative for polydipsia and polyuria.  Genitourinary:  Negative for dysuria, frequency and urgency.  Musculoskeletal:  Negative for arthralgias, back pain and myalgias.       Wrist pain  Hand pain   Skin:  Negative for pallor and rash.  Allergic/Immunologic: Negative for environmental allergies.  Neurological:  Negative for dizziness, syncope and headaches.   Hematological:  Negative for adenopathy. Does not bruise/bleed easily.  Psychiatric/Behavioral:  Negative for decreased concentration and dysphoric mood. The patient is not nervous/anxious.  Objective:   Physical Exam Constitutional:      General: She is not in acute distress.    Appearance: Normal appearance. She is well-developed. She is obese. She is not ill-appearing or diaphoretic.  HENT:     Head: Normocephalic and atraumatic.  Eyes:     General: No scleral icterus.       Right eye: No discharge.        Left eye: No discharge.     Conjunctiva/sclera: Conjunctivae normal.     Pupils: Pupils are equal, round, and reactive to light.  Neck:     Thyroid: No thyromegaly.     Vascular: No carotid bruit or JVD.  Cardiovascular:     Rate and Rhythm: Normal rate and regular rhythm.     Heart sounds: Normal heart sounds.     No gallop.  Pulmonary:     Effort: Pulmonary effort is normal. No respiratory distress.     Breath sounds: Normal breath sounds. No stridor. No wheezing, rhonchi or rales.     Comments: Cough sounds dry and hacking  Chest:     Chest wall: No tenderness.  Abdominal:     General: There is no distension or abdominal bruit.     Palpations: Abdomen is soft.  Musculoskeletal:     Cervical back: Normal range of motion and neck supple.     Right lower leg: No edema.     Left lower leg: No edema.     Comments: Left wrist  Medial prominence - feels bony (tip of ulna vs nodule) but no erythema Mild swelling  Pain reproduced with pronation /supination  No crepitus  Normal grip  Some distal PIP joint changes consistent with OA  Lymphadenopathy:     Cervical: No cervical adenopathy.  Skin:    General: Skin is warm and dry.     Coloration: Skin is not pale.     Findings: No rash.  Neurological:     Mental Status: She is alert.     Coordination: Coordination normal.     Deep Tendon Reflexes: Reflexes are normal and symmetric. Reflexes normal.   Psychiatric:        Mood and Affect: Mood normal.           Assessment & Plan:   Problem List Items Addressed This Visit       Other   Chronic cough - Primary    Chronic cough in setting of fam history of sarcoid No shortness of breath or wheezing  Has some globus sensation at times (did not improve with H2 blocker for reflux) Already takes gabapentin for another problem and this does not seem to help   Reassuring exam  Cxr is reassuring as well  Discussed goal to stop the cough cycle  Tessalon tid Prometh dm at night  Prednisone 20 mg daily 5 d (discussed side effects)   Update if not starting to improve in a week or if worsening  Call back and Er precautions noted in detail today         Relevant Orders   DG Chest 2 View (Completed)   Wrist pain, left    Pain is worse in medial wrist  Pt has known OA of hands  Tip of ulna looks enlarged / vs a nodule (firm and non mobile) Also some intermittent symptoms of tingling in 4,5th fingers- may be ulnar nerv compression   Referral done to hand specialist for further eval Ivery Quale  Relevant Orders   Ambulatory referral to Orthopedic Surgery

## 2022-11-02 NOTE — Assessment & Plan Note (Signed)
Pain is worse in medial wrist  Pt has known OA of hands  Tip of ulna looks enlarged / vs a nodule (firm and non mobile) Also some intermittent symptoms of tingling in 4,5th fingers- may be ulnar nerv compression   Referral done to hand specialist for further eval Katrina Huffman

## 2022-11-02 NOTE — Assessment & Plan Note (Signed)
Chronic cough in setting of fam history of sarcoid No shortness of breath or wheezing  Has some globus sensation at times (did not improve with H2 blocker for reflux) Already takes gabapentin for another problem and this does not seem to help   Reassuring exam  Cxr is reassuring as well  Discussed goal to stop the cough cycle  Tessalon tid Prometh dm at night  Prednisone 20 mg daily 5 d (discussed side effects)   Update if not starting to improve in a week or if worsening  Call back and Er precautions noted in detail today

## 2022-11-21 ENCOUNTER — Other Ambulatory Visit: Payer: Self-pay | Admitting: Family Medicine

## 2022-11-21 DIAGNOSIS — Z0000 Encounter for general adult medical examination without abnormal findings: Secondary | ICD-10-CM

## 2022-11-24 NOTE — Telephone Encounter (Signed)
I think this is too early?   Was 100 day supply on 8/12  Let me know if I am wrong

## 2022-11-24 NOTE — Telephone Encounter (Signed)
Meloxicam last filled on 09/15/2002 #100 caps/ 0 refills   Last OV was for cough and wrist pain on 10/31/22

## 2022-12-24 DIAGNOSIS — M13832 Other specified arthritis, left wrist: Secondary | ICD-10-CM | POA: Diagnosis not present

## 2022-12-24 DIAGNOSIS — M25522 Pain in left elbow: Secondary | ICD-10-CM | POA: Diagnosis not present

## 2022-12-24 DIAGNOSIS — G5622 Lesion of ulnar nerve, left upper limb: Secondary | ICD-10-CM | POA: Diagnosis not present

## 2022-12-24 DIAGNOSIS — M62542 Muscle wasting and atrophy, not elsewhere classified, left hand: Secondary | ICD-10-CM | POA: Diagnosis not present

## 2023-01-17 DIAGNOSIS — M25522 Pain in left elbow: Secondary | ICD-10-CM | POA: Diagnosis not present

## 2023-01-17 DIAGNOSIS — M25532 Pain in left wrist: Secondary | ICD-10-CM | POA: Diagnosis not present

## 2023-02-08 DIAGNOSIS — G5622 Lesion of ulnar nerve, left upper limb: Secondary | ICD-10-CM | POA: Diagnosis not present

## 2023-02-24 ENCOUNTER — Telehealth: Payer: Self-pay | Admitting: Obstetrics and Gynecology

## 2023-02-24 ENCOUNTER — Ambulatory Visit: Payer: Medicare Other

## 2023-02-24 DIAGNOSIS — Z8742 Personal history of other diseases of the female genital tract: Secondary | ICD-10-CM

## 2023-02-24 NOTE — Telephone Encounter (Signed)
This pt came in today for an [place].  Her last check out note from Sept. 2024 stated that she needed to have her annual exam in 4 months as well as have an [place], which was Jan. 2025. She is questioning whether she needs to have an annual exam.  If she needs an annual, what will that exam consist of for her?  She is also wanting to know if someone will reach out to her about the [place].

## 2023-02-24 NOTE — Telephone Encounter (Signed)
If she has regular annual exams (including breast exam) from her PCP then no she doesn't require an annual exam. We will let her know about her [place] in the next several days as it is read.

## 2023-02-25 NOTE — Telephone Encounter (Signed)
Ok.  I have let the pt know.

## 2023-02-26 DIAGNOSIS — M62542 Muscle wasting and atrophy, not elsewhere classified, left hand: Secondary | ICD-10-CM | POA: Diagnosis not present

## 2023-02-26 DIAGNOSIS — M13831 Other specified arthritis, right wrist: Secondary | ICD-10-CM | POA: Diagnosis not present

## 2023-02-26 DIAGNOSIS — M13832 Other specified arthritis, left wrist: Secondary | ICD-10-CM | POA: Diagnosis not present

## 2023-02-26 DIAGNOSIS — M6740 Ganglion, unspecified site: Secondary | ICD-10-CM | POA: Diagnosis not present

## 2023-02-26 DIAGNOSIS — G5622 Lesion of ulnar nerve, left upper limb: Secondary | ICD-10-CM | POA: Diagnosis not present

## 2023-02-26 DIAGNOSIS — M25532 Pain in left wrist: Secondary | ICD-10-CM | POA: Diagnosis not present

## 2023-02-26 DIAGNOSIS — G5602 Carpal tunnel syndrome, left upper limb: Secondary | ICD-10-CM | POA: Diagnosis not present

## 2023-03-02 ENCOUNTER — Encounter: Payer: Self-pay | Admitting: Obstetrics and Gynecology

## 2023-03-03 ENCOUNTER — Other Ambulatory Visit: Payer: Self-pay | Admitting: Obstetrics and Gynecology

## 2023-03-03 DIAGNOSIS — Z8742 Personal history of other diseases of the female genital tract: Secondary | ICD-10-CM

## 2023-03-18 DIAGNOSIS — D2271 Melanocytic nevi of right lower limb, including hip: Secondary | ICD-10-CM | POA: Diagnosis not present

## 2023-03-18 DIAGNOSIS — D225 Melanocytic nevi of trunk: Secondary | ICD-10-CM | POA: Diagnosis not present

## 2023-03-18 DIAGNOSIS — D0461 Carcinoma in situ of skin of right upper limb, including shoulder: Secondary | ICD-10-CM | POA: Diagnosis not present

## 2023-03-18 DIAGNOSIS — L821 Other seborrheic keratosis: Secondary | ICD-10-CM | POA: Diagnosis not present

## 2023-03-18 DIAGNOSIS — D2262 Melanocytic nevi of left upper limb, including shoulder: Secondary | ICD-10-CM | POA: Diagnosis not present

## 2023-03-18 DIAGNOSIS — D485 Neoplasm of uncertain behavior of skin: Secondary | ICD-10-CM | POA: Diagnosis not present

## 2023-03-20 DIAGNOSIS — M62532 Muscle wasting and atrophy, not elsewhere classified, left forearm: Secondary | ICD-10-CM | POA: Diagnosis not present

## 2023-03-20 DIAGNOSIS — G5622 Lesion of ulnar nerve, left upper limb: Secondary | ICD-10-CM | POA: Diagnosis not present

## 2023-03-20 DIAGNOSIS — G5682 Other specified mononeuropathies of left upper limb: Secondary | ICD-10-CM | POA: Diagnosis not present

## 2023-03-20 DIAGNOSIS — M6289 Other specified disorders of muscle: Secondary | ICD-10-CM | POA: Diagnosis not present

## 2023-03-20 DIAGNOSIS — G5602 Carpal tunnel syndrome, left upper limb: Secondary | ICD-10-CM | POA: Diagnosis not present

## 2023-03-24 ENCOUNTER — Encounter: Payer: Medicare Other | Admitting: Dermatology

## 2023-04-02 DIAGNOSIS — M25522 Pain in left elbow: Secondary | ICD-10-CM | POA: Diagnosis not present

## 2023-04-09 DIAGNOSIS — M79642 Pain in left hand: Secondary | ICD-10-CM | POA: Diagnosis not present

## 2023-04-14 DIAGNOSIS — M25522 Pain in left elbow: Secondary | ICD-10-CM | POA: Diagnosis not present

## 2023-04-21 DIAGNOSIS — M25522 Pain in left elbow: Secondary | ICD-10-CM | POA: Diagnosis not present

## 2023-04-22 DIAGNOSIS — D0461 Carcinoma in situ of skin of right upper limb, including shoulder: Secondary | ICD-10-CM | POA: Diagnosis not present

## 2023-04-30 DIAGNOSIS — M25522 Pain in left elbow: Secondary | ICD-10-CM | POA: Diagnosis not present

## 2023-05-14 DIAGNOSIS — M25522 Pain in left elbow: Secondary | ICD-10-CM | POA: Diagnosis not present

## 2023-06-05 ENCOUNTER — Ambulatory Visit (INDEPENDENT_AMBULATORY_CARE_PROVIDER_SITE_OTHER)

## 2023-06-05 DIAGNOSIS — Z8742 Personal history of other diseases of the female genital tract: Secondary | ICD-10-CM | POA: Diagnosis not present

## 2023-06-09 ENCOUNTER — Other Ambulatory Visit: Payer: Self-pay | Admitting: Family Medicine

## 2023-06-09 DIAGNOSIS — F419 Anxiety disorder, unspecified: Secondary | ICD-10-CM

## 2023-06-17 ENCOUNTER — Other Ambulatory Visit: Payer: Self-pay | Admitting: Family Medicine

## 2023-06-17 DIAGNOSIS — Z1231 Encounter for screening mammogram for malignant neoplasm of breast: Secondary | ICD-10-CM

## 2023-06-23 ENCOUNTER — Ambulatory Visit
Admission: RE | Admit: 2023-06-23 | Discharge: 2023-06-23 | Disposition: A | Discharge status: Home / Self Care | Source: Ambulatory Visit | Attending: Family Medicine | Admitting: Family Medicine

## 2023-06-23 DIAGNOSIS — Z1231 Encounter for screening mammogram for malignant neoplasm of breast: Secondary | ICD-10-CM | POA: Diagnosis not present

## 2023-06-30 ENCOUNTER — Other Ambulatory Visit: Payer: Self-pay | Admitting: Family Medicine

## 2023-06-30 ENCOUNTER — Ambulatory Visit (INDEPENDENT_AMBULATORY_CARE_PROVIDER_SITE_OTHER): Payer: Medicare Other

## 2023-06-30 ENCOUNTER — Ambulatory Visit: Payer: Self-pay | Admitting: Family Medicine

## 2023-06-30 VITALS — Ht 64.25 in | Wt 227.0 lb

## 2023-06-30 DIAGNOSIS — R928 Other abnormal and inconclusive findings on diagnostic imaging of breast: Secondary | ICD-10-CM

## 2023-06-30 DIAGNOSIS — Z0000 Encounter for general adult medical examination without abnormal findings: Secondary | ICD-10-CM

## 2023-06-30 NOTE — Progress Notes (Signed)
 Please attest and cosign this visit due to patients primary care provider not being in the office at the time the visit was completed.    Subjective:   Katrina Huffman is a 68 y.o. who presents for a Medicare Wellness preventive visit.  As a reminder, Annual Wellness Visits don't include a physical exam, and some assessments may be limited, especially if this visit is performed virtually. We may recommend an in-person follow-up visit with your provider if needed.  Visit Complete: Virtual I connected with  Katrina Huffman on 06/30/23 by a audio enabled telemedicine application and verified that I am speaking with the correct person using two identifiers.  Patient Location: Home  Provider Location: Home Office  I discussed the limitations of evaluation and management by telemedicine. The patient expressed understanding and agreed to proceed.  Vital Signs: Because this visit was a virtual/telehealth visit, some criteria may be missing or patient reported. Any vitals not documented were not able to be obtained and vitals that have been documented are patient reported.  VideoDeclined- This patient declined Librarian, academic. Therefore the visit was completed with audio only.  Persons Participating in Visit: Patient.  AWV Questionnaire: Yes: Patient Medicare AWV questionnaire was completed by the patient on 06/26/23; I have confirmed that all information answered by patient is correct and no changes since this date.  Cardiac Risk Factors include: advanced age (>31men, >69 women);hypertension;obesity (BMI >30kg/m2)     Objective:     Today's Vitals   06/26/23 0918 06/30/23 1532  Weight:  227 lb (103 kg)  Height:  5' 4.25" (1.632 m)  PainSc: 5     Body mass index is 38.66 kg/m.     06/30/2023    3:42 PM 09/29/2022    6:24 AM 06/25/2022    2:51 PM 06/10/2021    3:43 PM 04/02/2021   12:50 PM 02/07/2020    1:42 PM 12/22/2019    6:01 AM  Advanced Directives   Does Patient Have a Medical Advance Directive? Yes No Yes Yes Yes No No  Type of Estate agent of [place];Living will  Healthcare Power of [place];Living will Living will Healthcare Power of [place];Living will    Copy of Healthcare Power of Attorney in Chart? No - copy requested  No - copy requested      Would patient like information on creating a medical advance directive?  No - Patient declined  No - Patient declined   Yes (MAU/Ambulatory/Procedural Areas - Information given)    Current Medications (verified) Outpatient Encounter Medications as of 06/30/2023  Medication Sig   acetaminophen  (TYLENOL  8 HOUR) 650 MG CR tablet Take 1 tablet (650 mg total) by mouth every 8 (eight) hours as needed for pain.   ALPRAZolam  (XANAX ) 0.5 MG tablet Take 1 tablet (0.5 mg total) by mouth daily as needed for anxiety.   Calcium 500-100 MG-UNIT CHEW Chew by mouth.   Cholecalciferol (VITAMIN D ) 50 MCG (2000 UT) tablet Take 4,000 Units by mouth daily.   citalopram  (CELEXA ) 20 MG tablet TAKE 1 TABLET BY MOUTH DAILY   Cranberry 400 MG CAPS Take by mouth daily.    gabapentin  (NEURONTIN ) 300 MG capsule TAKE 3 CAPSULES BY MOUTH 3  TIMES DAILY   meloxicam  (MOBIC ) 15 MG tablet TAKE 1 TABLET BY MOUTH DAILY  WITH FOOD AS NEEDED FOR PAIN   Multiple Vitamins-Minerals (BARIATRIC MULTIVITAMINS/IRON) CAPS Take by mouth daily.   benzonatate  (TESSALON ) 200 MG capsule Take 1 capsule (200  mg total) by mouth 3 (three) times daily as needed for cough. Swallow whole (Patient not taking: Reported on 06/30/2023)   predniSONE  (DELTASONE ) 20 MG tablet Take 1 tablet (20 mg total) by mouth daily with breakfast. (Patient not taking: Reported on 06/30/2023)   promethazine -dextromethorphan (PROMETHAZINE -DM) 6.25-15 MG/5ML syrup Take 5 mLs by mouth at bedtime as needed for cough. (Patient not taking: Reported on 06/30/2023)   No facility-administered encounter medications on file as of 06/30/2023.    Allergies  (verified) Patient has no known allergies.   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   History of 2019 novel coronavirus disease (COVID-19) 02/28/2019   positive results in care everywhere;  12-15-2019  per pt mild symptoms resolved in just over 2 wks with no residual   History of hypertension    per pt hx dx HTN prior to gastric bypass 12/ 2018,  since lost wt no issue   OSA (obstructive sleep apnea)    12-15-2019 per pt has not used cpap since 01/ 2021 stated does not need   PONV (postoperative nausea and vomiting)    PONV after knee arthroplasty   Right ureteral stone    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   Scoliosis    sees chiropracter monthly   Sleep apnea    Wears glasses    Past Surgical History:  Procedure Laterality Date   BREAST SURGERY  1999   breast biopsy   BUNIONECTOMY  2000   CESAREAN SECTION  1983; 1985   COLONOSCOPY WITH PROPOFOL  N/A 04/30/2015   Procedure: COLONOSCOPY WITH PROPOFOL ;  Surgeon: Marnee Sink, MD;  Location: [facility];  Service: Endoscopy;  Laterality: N/A;   COLONOSCOPY WITH PROPOFOL  N/A 05/03/2019   Procedure: COLONOSCOPY WITH PROPOFOL ;  Surgeon: Marnee Sink, MD;  Location: [facility];  Service: Endoscopy;  Laterality: N/A;  patient was COVID POSITIVE on 02/26/2019   CYSTOSCOPY WITH RETROGRADE PYELOGRAM, URETEROSCOPY AND STENT PLACEMENT Right 12/22/2019   Procedure: CYSTOSCOPY WITH RIGHT RETROGRADE PYELOGRAM, URETEROSCOPY WITH HOLMIUM LASER , BASKET EXTRACTION OF STONES AND STENT PLACEMENT;  Surgeon: Andrez Banker, MD;  Location: [facility];  Service: Urology;  Laterality: Right;   DIAGNOSTIC LAPAROSCOPY  01/19/2017   LOA w/ Gastropexy/  EGD   EXTRACORPOREAL SHOCK WAVE LITHOTRIPSY Right 08/22/2019   Procedure: RIGHT EXTRACORPOREAL SHOCK WAVE LITHOTRIPSY (ESWL);  Surgeon: Adelbert Homans, MD;  Location[facility];  Service: Urology;  Laterality: Right;   EYE SURGERY     HYSTEROSCOPY WITH D & C  x2  last one 01/ 2017   HYSTEROSCOPY WITH D & C N/A 09/29/2022   Procedure: DILATATION AND CURETTAGE;  Surgeon: Teresa Fender, MD;  Location: [facility];  Service: Gynecology;  Laterality: N/A;   IUD REMOVAL N/A 09/29/2022   Procedure: INTRAUTERINE DEVICE (IUD) REMOVAL;  Surgeon: Teresa Fender, MD;  Location: [facility];  Service: Gynecology;  Laterality: N/A;   JOINT REPLACEMENT     KNEE ARTHROSCOPY Left 05/09/2015   Procedure: ARTHROSCOPY LEFT KNEE WITH SYNOVECTOMY;  Surgeon: Liliane Rei, MD;  Location: [facility];  Service: Orthopedics;  Laterality: Left;   KNEE ARTHROSCOPY Left 04/27/2017   Procedure: Left  knee arthroscopy; synovectomy;  Surgeon: Liliane Rei, MD;  Location: [facility];  Service: Orthopedics;  Laterality: Left;   KNEE ARTHROSCOPY Left 2009   LAPAROSCOPIC GASTRIC SLEEVE RESECTION  01/14/2017   POLYPECTOMY  04/30/2015   Procedure: POLYPECTOMY;  Surgeon: Marnee Sink, MD;  Location: [facility];  Service: Endoscopy;;   TONSILLECTOMY AND ADENOIDECTOMY  2007   TOTAL KNEE ARTHROPLASTY Right 04/27/2017   Procedure: RIGHT TOTAL KNEE ARTHROPLASTY;  Surgeon: Liliane Rei, MD;  Location: [facility];  Service: Orthopedics;  Laterality: Right;   TOTAL KNEE ARTHROPLASTY Left 2014   TOTAL KNEE ARTHROPLASTY WITH REVISION COMPONENTS Left 04/16/2016   Procedure: LEFT TOTAL KNEE ARTHROPLASTY WITH POLY REVISION;  Surgeon: Liliane Rei, MD;  Location: [facility];  Service: Orthopedics;  Laterality: Left;  with abductor block   TOTAL SHOULDER ARTHROPLASTY Right 10/16/2016   TOTAL SHOULDER ARTHROPLASTY Right 10/16/2016   Procedure: TOTAL SHOULDER ARTHROPLASTY;  Surgeon: Ellard Gunning, MD;  Location: [facility];  Service:  Orthopedics;  Laterality: Right;   TUBAL LIGATION  1986 & 1991   VEIN LIGATION AND STRIPPING  2000   Family History  Problem Relation Age of Onset   Arthritis Mother    Hypertension Mother    Varicose Veins Mother    Arthritis Father    Cancer Father        prostate CA   Hypertension Father    Breast cancer Paternal Aunt    Cancer Paternal Aunt        breast cancer   Arthritis Maternal Grandmother    Hypertension Maternal Grandmother    Hearing loss Maternal Grandmother    Vision loss Maternal Grandmother    Arthritis Maternal Grandfather    Hypertension Maternal Grandfather    Diabetes Maternal Grandfather    Diabetes Brother    Anxiety disorder Brother    Depression Brother    Social History   Socioeconomic History   Marital status: Divorced    Spouse name: Not on file   Number of children: Not on file   Years of education: Not on file   Highest education level: Bachelor's degree (e.g., BA, AB, BS)  Occupational History   Not on file  Tobacco Use   Smoking status: Never   Smokeless tobacco: Never  Vaping Use   Vaping status: Never Used  Substance and Sexual Activity   Alcohol use: Not Currently    Comment: rare   Drug use: Never   Sexual activity: Not Currently  Other Topics Concern   Not on file  Social History Narrative   Right handed   Drinks caffeine   One story home   Social Drivers of Health   Financial Resource Strain: Low Risk  (06/26/2023)   Overall Financial Resource Strain (CARDIA)    Difficulty of Paying Living Expenses: Not hard at all  Food Insecurity: No Food Insecurity (06/26/2023)   Hunger Vital Sign    Worried About Running Out of Food in the Last Year: Never true    Ran Out of Food in the Last Year: Never true  Transportation Needs: No Transportation Needs (06/26/2023)   PRAPARE - Administrator, Civil Service (Medical): No    Lack of Transportation (Non-Medical): No  Physical Activity: Insufficiently Active (06/26/2023)    Exercise Vital Sign    Days of Exercise per Week: 2 days    Minutes of Exercise per Session: 50 min  Stress: No Stress Concern Present (06/26/2023)   [employer] of Occupational Health - Occupational Stress  Questionnaire    Feeling of Stress : Only a little  Social Connections: Moderately Integrated (06/26/2023)   Social Connection and Isolation Panel [NHANES]    Frequency of Communication with Friends and Family: Three times a week    Frequency of Social Gatherings with Friends and Family: Three times a week    Attends Religious Services: More than 4 times per year    Active Member of Clubs or Organizations: Yes    Attends Engineer, structural: More than 4 times per year    Marital Status: Divorced    Tobacco Counseling Counseling given: Not Answered    Clinical Intake:  Pre-visit preparation completed: Yes  Pain : 0-10 Pain Score: 5  Pain Type: Chronic pain Pain Location: Hand (both hands and arm) Pain Descriptors / Indicators: Aching Pain Onset: More than a month ago Pain Frequency: Intermittent Pain Relieving Factors: voltaren gel, tylenol  athritis, meloxicam  Effect of Pain on Daily Activities: hard to pick up or open things  Pain Relieving Factors: voltaren gel, tylenol  athritis, meloxicam   BMI - recorded: 38.66 Nutritional Status: BMI > 30  Obese Nutritional Risks: None Diabetes: No  Lab Results  Component Value Date   HGBA1C 5.6 09/11/2022   HGBA1C 5.5 08/28/2021     How often do you need to have someone help you when you read instructions, pamphlets, or other written materials from your doctor or pharmacy?: 1 - Never  Interpreter Needed?: No  Comments: lives alone Information entered by :: B.Aspin Palomarez,LPN   Activities of Daily Living     06/26/2023    9:18 AM  In your present state of health, do you have any difficulty performing the following activities:  Hearing? 0  Vision? 0  Difficulty concentrating or making decisions? 0   Walking or climbing stairs? 0  Dressing or bathing? 0  Doing errands, shopping? 0  Preparing Food and eating ? N  Using the Toilet? N  In the past six months, have you accidently leaked urine? N  Do you have problems with loss of bowel control? N  Managing your Medications? N  Managing your Finances? N  Housekeeping or managing your Housekeeping? N    Patient Care Team: Tower, Manley Seeds, MD as PCP - General (Family Medicine)  Indicate any recent Medical Services you may have received from other than [facility] providers in the past year (date may be approximate).     Assessment:    This is a routine wellness examination for Katrina Huffman.  Hearing/Vision screen Hearing Screening - Comments:: Pt says her hearing is good Vision Screening - Comments:: Pt says her vision is good Dr [facility]   Goals Addressed             This Visit's Progress    Exercise 3x per week (30 min per time)   On track    06/30/23-Increase exercise as tolerated. Try to work until age 47 and then retire.       Patient Stated       Lose 30 pounds.  06/30/23-has lost 11 pounds       Depression Screen     06/30/2023    3:40 PM 10/31/2022    4:49 PM 09/15/2022    3:28 PM 06/25/2022    2:47 PM 04/21/2022   12:09 PM 09/30/2021    3:13 PM 09/04/2021    2:45 PM  PHQ 2/9 Scores  PHQ - 2 Score 0 0 0 0 1 0 0  PHQ- 9 Score  0  3  8      Fall Risk     06/26/2023    9:18 AM 10/31/2022    4:49 PM 09/15/2022    3:28 PM 06/25/2022    2:52 PM 06/23/2022   10:34 AM  Fall Risk   Falls in the past year? 0 0 0 0 0  Number falls in past yr:  0 0 0 0  Injury with Fall?  0 0 0 0  Risk for fall due to : No Fall Risks No Fall Risks No Fall Risks No Fall Risks   Follow up Education provided;Falls prevention discussed Falls evaluation completed Falls evaluation completed Falls prevention discussed;Falls evaluation completed     MEDICARE RISK AT HOME:  Medicare Risk at Home Any stairs in or around the home?:  (Patient-Rptd) No If so, are there any without handrails?: (Patient-Rptd) No Home free of loose throw rugs in walkways, pet beds, electrical cords, etc?: (Patient-Rptd) Yes Adequate lighting in your home to reduce risk of falls?: (Patient-Rptd) Yes Use of a cane, walker or w/c?: (Patient-Rptd) No Grab bars in the bathroom?: (Patient-Rptd) No Shower chair or bench in shower?: (Patient-Rptd) Yes Elevated toilet seat or a handicapped toilet?: (Patient-Rptd) Yes  TIMED UP AND GO:  Was the test performed?  No  Cognitive Function: 6CIT completed        06/30/2023    3:45 PM 06/25/2022    2:54 PM 06/10/2021    3:48 PM  6CIT Screen  What Year? 0 points 0 points 0 points  What month? 0 points 0 points 0 points  What time? 0 points 0 points 0 points  Count back from 20 0 points 0 points 0 points  Months in reverse 0 points 0 points 0 points  Repeat phrase 0 points 0 points 0 points  Total Score 0 points 0 points 0 points    Immunizations Immunization History  Administered Date(s) Administered   Fluad Quad(high Dose 65+) 12/06/2019   Influenza,inj,Quad PF,6+ Mos 03/05/2016, 11/27/2016, 12/08/2017, 10/29/2020   Influenza-Unspecified 11/29/2016, 12/04/2017   PFIZER(Purple Top)SARS-COV-2 Vaccination 06/10/2019, 07/05/2019, 01/19/2020   Pneumococcal Conjugate-13 04/26/2019   Pneumococcal Polysaccharide-23 05/07/2020   Tdap 03/06/2017   Zoster Recombinant(Shingrix) 04/06/2016, 06/11/2016    Screening Tests Health Maintenance  Topic Date Due   Hepatitis C Screening  Never done   COVID-19 Vaccine (4 - 2024-25 season) 10/05/2022   INFLUENZA VACCINE  09/04/2023   Colonoscopy  05/02/2024   Medicare Annual Wellness (AWV)  06/29/2024   MAMMOGRAM  06/22/2025   DTaP/Tdap/Td (2 - Td or Tdap) 03/07/2027   Pneumonia Vaccine 77+ Years old  Completed   DEXA SCAN  Completed   Zoster Vaccines- Shingrix  Completed   HPV VACCINES  Aged Out   Meningococcal B Vaccine  Aged Out    Health  Maintenance  Health Maintenance Due  Topic Date Due   Hepatitis C Screening  Never done   COVID-19 Vaccine (4 - 2024-25 season) 10/05/2022   Health Maintenance Items Addressed: None needed at this time  Additional Screening:  Vision Screening: Recommended annual ophthalmology exams for early detection of glaucoma and other disorders of the eye.  Dental Screening: Recommended annual dental exams for proper oral hygiene  Community Resource Referral / Chronic Care Management: CRR required this visit?  No   CCM required this visit?  Appt scheduled with PCP   Plan:    I have personally reviewed and noted the following in the patient's chart:   Medical and social history Use  of alcohol, tobacco or illicit drugs  Current medications and supplements including opioid prescriptions. Patient is not currently taking opioid prescriptions. Functional ability and status Nutritional status Physical activity Advanced directives List of other physicians Hospitalizations, surgeries, and ER visits in previous 12 months Vitals Screenings to include cognitive, depression, and falls Referrals and appointments  In addition, I have reviewed and discussed with patient certain preventive protocols, quality metrics, and best practice recommendations. A written personalized care plan for preventive services as well as general preventive health recommendations were provided to patient.   Katrina Bannister, LPN   03/19/863   After Visit Summary: (MyChart) Due to this being a telephonic visit, the after visit summary with patients personalized plan was offered to patient via MyChart   Notes: Nothing significant to report at this time.

## 2023-06-30 NOTE — Patient Instructions (Signed)
 Katrina Huffman , Thank you for taking time out of your busy schedule to complete your Annual Wellness Visit with me. I enjoyed our conversation and look forward to speaking with you again next year. I, as well as your care team,  appreciate your ongoing commitment to your health goals. Please review the following plan we discussed and let me know if I can assist you in the future. Your Game plan/ To Do List    Follow up Visits: Next Medicare AWV with our clinical staff: 06/30/24 @ 3:40pm   Have you seen your provider in the last 6 months (3 months if uncontrolled diabetes)? No Next Office Visit with your provider: 09/22/23  Clinician Recommendations:  Aim for 30 minutes of exercise or brisk walking, 6-8 glasses of water , and 5 servings of fruits and vegetables each day.       This is a list of the screening recommended for you and due dates:  Health Maintenance  Topic Date Due   Hepatitis C Screening  Never done   COVID-19 Vaccine (4 - 2024-25 season) 10/05/2022   Flu Shot  09/04/2023   Colon Cancer Screening  05/02/2024   Medicare Annual Wellness Visit  06/29/2024   Mammogram  06/22/2025   DTaP/Tdap/Td vaccine (2 - Td or Tdap) 03/07/2027   Pneumonia Vaccine  Completed   DEXA scan (bone density measurement)  Completed   Zoster (Shingles) Vaccine  Completed   HPV Vaccine  Aged Out   Meningitis B Vaccine  Aged Out    Advanced directives: (Copy Requested) Please bring a copy of your health care power of attorney and living will to the office to be added to your chart at your convenience. You can mail to [facility] [address] [place], [place] [address] or email to [email or web address] Advance Care Planning is important because it:  [x]  Makes sure you receive the medical care that is consistent with your values, goals, and preferences  [x]  It provides guidance to your family and loved ones and reduces their decisional burden about whether or not they are making  the right decisions based on your wishes.  Follow the link provided in your after visit summary or read over the paperwork we have mailed to you to help you started getting your Advance Directives in place. If you need assistance in completing these, please reach out to us  so that we can help you!

## 2023-07-02 DIAGNOSIS — M19041 Primary osteoarthritis, right hand: Secondary | ICD-10-CM | POA: Diagnosis not present

## 2023-07-02 DIAGNOSIS — M19031 Primary osteoarthritis, right wrist: Secondary | ICD-10-CM | POA: Diagnosis not present

## 2023-07-02 DIAGNOSIS — M19032 Primary osteoarthritis, left wrist: Secondary | ICD-10-CM | POA: Diagnosis not present

## 2023-07-07 ENCOUNTER — Ambulatory Visit
Admission: RE | Admit: 2023-07-07 | Discharge: 2023-07-07 | Disposition: A | Discharge status: Home / Self Care | Source: Ambulatory Visit | Attending: Family Medicine | Admitting: Family Medicine

## 2023-07-07 ENCOUNTER — Ambulatory Visit: Payer: Self-pay | Admitting: Family Medicine

## 2023-07-07 ENCOUNTER — Encounter: Payer: Self-pay | Admitting: Family Medicine

## 2023-07-07 DIAGNOSIS — R928 Other abnormal and inconclusive findings on diagnostic imaging of breast: Secondary | ICD-10-CM | POA: Insufficient documentation

## 2023-07-07 DIAGNOSIS — R92333 Mammographic heterogeneous density, bilateral breasts: Secondary | ICD-10-CM | POA: Diagnosis not present

## 2023-08-11 ENCOUNTER — Other Ambulatory Visit: Payer: Self-pay | Admitting: Family Medicine

## 2023-08-11 DIAGNOSIS — F32A Depression, unspecified: Secondary | ICD-10-CM

## 2023-09-14 ENCOUNTER — Telehealth: Payer: Self-pay | Admitting: Family Medicine

## 2023-09-14 DIAGNOSIS — Z9884 Bariatric surgery status: Secondary | ICD-10-CM

## 2023-09-14 DIAGNOSIS — E559 Vitamin D deficiency, unspecified: Secondary | ICD-10-CM

## 2023-09-14 DIAGNOSIS — I10 Essential (primary) hypertension: Secondary | ICD-10-CM

## 2023-09-14 DIAGNOSIS — R7303 Prediabetes: Secondary | ICD-10-CM

## 2023-09-14 NOTE — Telephone Encounter (Signed)
-----   Message from Veva JINNY Ferrari sent at 08/28/2023  2:41 PM EDT ----- Regarding: lab orders for Tue, 8.12.25 Patient is scheduled for CPX labs, please order future labs, Thanks , Veva

## 2023-09-15 ENCOUNTER — Ambulatory Visit: Payer: Self-pay | Admitting: Family Medicine

## 2023-09-15 ENCOUNTER — Other Ambulatory Visit (INDEPENDENT_AMBULATORY_CARE_PROVIDER_SITE_OTHER)

## 2023-09-15 DIAGNOSIS — I10 Essential (primary) hypertension: Secondary | ICD-10-CM

## 2023-09-15 DIAGNOSIS — R7303 Prediabetes: Secondary | ICD-10-CM

## 2023-09-15 DIAGNOSIS — Z9884 Bariatric surgery status: Secondary | ICD-10-CM

## 2023-09-15 DIAGNOSIS — E559 Vitamin D deficiency, unspecified: Secondary | ICD-10-CM

## 2023-09-15 LAB — COMPREHENSIVE METABOLIC PANEL WITH GFR
ALT: 9 U/L (ref 0–35)
AST: 13 U/L (ref 0–37)
Albumin: 3.9 g/dL (ref 3.5–5.2)
Alkaline Phosphatase: 74 U/L (ref 39–117)
BUN: 17 mg/dL (ref 6–23)
CO2: 32 meq/L (ref 19–32)
Calcium: 8.7 mg/dL (ref 8.4–10.5)
Chloride: 105 meq/L (ref 96–112)
Creatinine, Ser: 0.67 mg/dL (ref 0.40–1.20)
GFR: 89.06 mL/min (ref >60.00)
Glucose, Bld: 94 mg/dL (ref 70–99)
Potassium: 4.6 meq/L (ref 3.5–5.1)
Sodium: 143 meq/L (ref 135–145)
Total Bilirubin: 0.4 mg/dL (ref 0.2–1.2)
Total Protein: 6.1 g/dL (ref 6.0–8.3)

## 2023-09-15 LAB — CBC WITH DIFFERENTIAL/PLATELET
Basophils Absolute: 0.1 K/uL (ref 0.0–0.1)
Basophils Relative: 2 % (ref 0.0–3.0)
Eosinophils Absolute: 0.2 K/uL (ref 0.0–0.7)
Eosinophils Relative: 4.2 % (ref 0.0–5.0)
HCT: 35.6 % — ABNORMAL LOW (ref 36.0–46.0)
Hemoglobin: 11.6 g/dL — ABNORMAL LOW (ref 12.0–15.0)
Lymphocytes Relative: 28.6 % (ref 12.0–46.0)
Lymphs Abs: 1.2 K/uL (ref 0.7–4.0)
MCHC: 32.5 g/dL (ref 30.0–36.0)
MCV: 82.5 fl (ref 78.0–100.0)
Monocytes Absolute: 0.5 K/uL (ref 0.1–1.0)
Monocytes Relative: 11.2 % (ref 3.0–12.0)
Neutro Abs: 2.3 K/uL (ref 1.4–7.7)
Neutrophils Relative %: 54 % (ref 43.0–77.0)
Platelets: 223 K/uL (ref 150.0–400.0)
RBC: 4.32 Mil/uL (ref 3.87–5.11)
RDW: 13.9 % (ref 11.5–15.5)
WBC: 4.2 K/uL (ref 4.0–10.5)

## 2023-09-15 LAB — LIPID PANEL
Cholesterol: 191 mg/dL (ref 0–200)
HDL: 61.1 mg/dL (ref >39.00)
LDL Cholesterol: 117 mg/dL — ABNORMAL HIGH (ref 0–99)
NonHDL: 129.9
Total CHOL/HDL Ratio: 3
Triglycerides: 66 mg/dL (ref 0.0–149.0)
VLDL: 13.2 mg/dL (ref 0.0–40.0)

## 2023-09-15 LAB — HEMOGLOBIN A1C: Hgb A1c MFr Bld: 6 % (ref 4.6–6.5)

## 2023-09-15 LAB — VITAMIN B12: Vitamin B-12: 494 pg/mL (ref 211–911)

## 2023-09-15 LAB — VITAMIN D 25 HYDROXY (VIT D DEFICIENCY, FRACTURES): VITD: 38.53 ng/mL (ref 30.00–100.00)

## 2023-09-15 LAB — TSH: TSH: 3.35 u[IU]/mL (ref 0.35–5.50)

## 2023-09-22 ENCOUNTER — Ambulatory Visit (INDEPENDENT_AMBULATORY_CARE_PROVIDER_SITE_OTHER): Admitting: Family Medicine

## 2023-09-22 ENCOUNTER — Encounter: Payer: Self-pay | Admitting: Family Medicine

## 2023-09-22 VITALS — BP 141/88 | HR 66 | Temp 98.3°F | Ht 64.25 in | Wt 224.2 lb

## 2023-09-22 DIAGNOSIS — Z0000 Encounter for general adult medical examination without abnormal findings: Secondary | ICD-10-CM

## 2023-09-22 DIAGNOSIS — R7303 Prediabetes: Secondary | ICD-10-CM | POA: Diagnosis not present

## 2023-09-22 DIAGNOSIS — F419 Anxiety disorder, unspecified: Secondary | ICD-10-CM | POA: Insufficient documentation

## 2023-09-22 DIAGNOSIS — I83892 Varicose veins of left lower extremities with other complications: Secondary | ICD-10-CM | POA: Diagnosis not present

## 2023-09-22 DIAGNOSIS — I10 Essential (primary) hypertension: Secondary | ICD-10-CM

## 2023-09-22 DIAGNOSIS — E559 Vitamin D deficiency, unspecified: Secondary | ICD-10-CM

## 2023-09-22 DIAGNOSIS — Z9884 Bariatric surgery status: Secondary | ICD-10-CM | POA: Diagnosis not present

## 2023-09-22 DIAGNOSIS — E7800 Pure hypercholesterolemia, unspecified: Secondary | ICD-10-CM

## 2023-09-22 DIAGNOSIS — N8500 Endometrial hyperplasia, unspecified: Secondary | ICD-10-CM

## 2023-09-22 DIAGNOSIS — F32A Depression, unspecified: Secondary | ICD-10-CM

## 2023-09-22 MED ORDER — CITALOPRAM HYDROBROMIDE 20 MG PO TABS: 20.0000 mg | ORAL_TABLET | Freq: Every day | ORAL | 3 refills | Status: AC

## 2023-09-22 NOTE — Assessment & Plan Note (Signed)
 Prediabetes  Lab Results  Component Value Date   HGBA1C 6.0 09/15/2023   HGBA1C 5.6 09/11/2022   HGBA1C 5.5 08/28/2021    disc imp of low glycemic diet and wt loss to prevent DM2

## 2023-09-22 NOTE — Assessment & Plan Note (Signed)
 Last vitamin D  Lab Results  Component Value Date   VD25OH 38.53 09/15/2023   Vitamin D  level is therapeutic with current supplementation Disc importance of this to bone and overall health

## 2023-09-22 NOTE — Assessment & Plan Note (Signed)
 Continues Citalopram  20 mg daily  Xanax  prn emergencies 0.5 mg   Doing well overall

## 2023-09-22 NOTE — Assessment & Plan Note (Signed)
 Disc goals for lipids and reasons to control them Rev last labs with pt Rev low sat fat diet in detail Both LDL and HDL are up from a year ago

## 2023-09-22 NOTE — Assessment & Plan Note (Signed)
 Watching labs/vit levels Last vitamin D  Lab Results  Component Value Date   VD25OH 38.53 09/15/2023   Lab Results  Component Value Date   VITAMINB12 494 09/15/2023    Has gained some weight back Encouraged to get back on track with diet

## 2023-09-22 NOTE — Patient Instructions (Addendum)
 You will be due for colonoscopy in march 2026  If you don't hear from the GI folks at that time to schedule, please let us  know   You do need to wear the compression garments to prevent worsening of the varicose veins   Get back on track with healthy diet  Try to get most of your carbohydrates from produce (with the exception of white potatoes) and whole grains Eat less bread/pasta/rice/snack foods/cereals/sweets and other items from the middle of the grocery store (processed carbs)   Follow up in 1-2 months for blood pressure  If not improved we may need to start back on some blood pressure medicine

## 2023-09-22 NOTE — Assessment & Plan Note (Signed)
 In past  Last US  was reassuring in may  Encouraged to continue gyn follow up  No longer has IUD

## 2023-09-22 NOTE — Assessment & Plan Note (Signed)
 Reviewed health habits including diet and exercise and skin cancer prevention Reviewed appropriate screening tests for age  Also reviewed health mt list, fam hx and immunization status , as well as social and family history   See HPI Labs reviewed and ordered Health Maintenance  Topic Date Due   Hepatitis C Screening  Never done   Flu Shot  05/03/2024*   COVID-19 Vaccine (4 - 2024-25 season) 10/07/2025*   Colon Cancer Screening  05/02/2024   Medicare Annual Wellness Visit  09/21/2024   Mammogram  06/22/2025   DTaP/Tdap/Td vaccine (2 - Td or Tdap) 03/07/2027   Pneumococcal Vaccine for age over 85  Completed   DEXA scan (bone density measurement)  Completed   Zoster (Shingles) Vaccine  Completed   HPV Vaccine  Aged Out   Meningitis B Vaccine  Aged Out  *Topic was postponed. The date shown is not the original due date.    Recommend follow up with gyn to make plan for endometrial hyperplasia  Colonoscopy due 04/2024  pt aware Discussed fall prevention, supplements and exercise for bone density  PHQ 0  Plan follow up for elevated blood pressure

## 2023-09-22 NOTE — Assessment & Plan Note (Deleted)
 Continues Citalopram  20 mg daily  Xanax  prn emergencies 0.5 mg   Doing well overall

## 2023-09-22 NOTE — Progress Notes (Signed)
 Subjective:    Patient ID: Katrina Huffman, female    DOB: 1955-10-02, 68 y.o.   MRN: [id number]  HPI  Here for health maintenance exam and to review chronic medical problems   Wt Readings from Last 3 Encounters:  09/22/23 224 lb 4 oz (101.7 kg)  06/30/23 227 lb (103 kg)  10/31/22 227 lb (103 kg)   38.19 kg/m  Vitals:   09/22/23 1422 09/22/23 1459  BP: (!) 148/92 (!) 141/88  Pulse: 66   Temp: 98.3 F (36.8 C)   SpO2: 97%     Immunization History  Administered Date(s) Administered   Fluad Quad(high Dose 65+) 12/06/2019   Influenza,inj,Quad PF,6+ Mos 03/05/2016, 11/27/2016, 12/08/2017, 10/29/2020   Influenza-Unspecified 11/29/2016, 12/04/2017   PFIZER(Purple Top)SARS-COV-2 Vaccination 06/10/2019, 07/05/2019, 01/19/2020   Pneumococcal Conjugate-13 04/26/2019   Pneumococcal Polysaccharide-23 05/07/2020   Tdap 03/06/2017   Zoster Recombinant(Shingrix) 04/06/2016, 06/11/2016    Health Maintenance Due  Topic Date Due   Hepatitis C Screening  Never done     Mammogram 06/2023 -had to have addnl views / these were normal  Self breast exam-no lumps   Gyn health Pelvic us  was 06/2023 -endometrium 2.9 mm Dr Connell left the practice  Will gets est with new provider No vaginal bleeding  No longer has IUD    Colon cancer screening  Colonoscopy 04/2019 with 5 y recall for polyps   Bone health  Dexa  09/2022 -bmd in the normal range   Falls- none  Fractures-none  Supplements -taking vitamin D   Also some calcium chews  Last vitamin D  Lab Results  Component Value Date   VD25OH 38.53 09/15/2023    Exercise  Water  aerobics  Gets a good work out Enjoys it     Just had surgery on her ulnar nerve of LUE  Some discomfort   Left shoulder is starting to bother her - thinks arthritis  Also left handed   More varicose veins in lower legs Especially the left  Has had vein stripping in left in the past      Mood    06/30/2023    3:40 PM 10/31/2022    4:49 PM  09/15/2022    3:28 PM 06/25/2022    2:47 PM 04/21/2022   12:09 PM  Depression screen PHQ 2/9  Decreased Interest 0 0 0 0 0  Down, Depressed, Hopeless 0 0 0 0 1  PHQ - 2 Score 0 0 0 0 1  Altered sleeping  0 2  0  Tired, decreased energy  0 1  3  Change in appetite  0 0  2  Feeling bad or failure about yourself   0 0  2  Trouble concentrating  0 0  0  Moving slowly or fidgety/restless  0 0  0  Suicidal thoughts  0 0  0  PHQ-9 Score  0 3  8  Difficult doing work/chores  Not difficult at all Not difficult at all  Not difficult at all      10/31/2022    4:49 PM 09/15/2022    3:29 PM 04/21/2022   12:09 PM 08/31/2019   10:35 AM  GAD 7 : Generalized Anxiety Score  Nervous, Anxious, on Edge 1 0 2 0  Control/stop worrying 0 0 1 0  Worry too much - different things 0 0 0 0  Trouble relaxing 0 0 2 0  Restless 0 0 0 0  Easily annoyed or irritable 0 0 1 0  Afraid - awful  might happen 0 0 0 0  Total GAD 7 Score 1 0 6 0  Anxiety Difficulty Not difficult at all Not difficult at all Not difficult at all      GAD citalopram  20 mg daily , xanax  prn 0.5 mg infrequently     HTN bp is stable today  No cp or palpitations or headaches or edema  No medications currently  BP Readings from Last 3 Encounters:  09/22/23 (!) 141/88  10/31/22 130/78  10/08/22 (!) 151/81     Lab Results  Component Value Date   NA 143 09/15/2023   K 4.6 09/15/2023   CO2 32 09/15/2023   GLUCOSE 94 09/15/2023   BUN 17 09/15/2023   CREATININE 0.67 09/15/2023   CALCIUM 8.7 09/15/2023   GFR 89.06 09/15/2023   GFRNONAA >60 04/29/2017   Uses cpap for OSA  Cholesterol Lab Results  Component Value Date   CHOL 191 09/15/2023   CHOL 175 09/11/2022   CHOL 181 08/28/2021   Lab Results  Component Value Date   HDL 61.10 09/15/2023   HDL 56.70 09/11/2022   HDL 53.80 08/28/2021   Lab Results  Component Value Date   LDLCALC 117 (H) 09/15/2023   LDLCALC 97 09/11/2022   LDLCALC 108 (H) 08/28/2021   Lab Results   Component Value Date   TRIG 66.0 09/15/2023   TRIG 103.0 09/11/2022   TRIG 96.0 08/28/2021   Lab Results  Component Value Date   CHOLHDL 3 09/15/2023   CHOLHDL 3 09/11/2022   CHOLHDL 3 08/28/2021   No results found for: LDLDIRECT  Started eating 2 eggs every day     Prediabetes  Lab Results  Component Value Date   HGBA1C 6.0 09/15/2023   HGBA1C 5.6 09/11/2022   HGBA1C 5.5 08/28/2021   Gained more weight and then lost it   2 months of really eating good Then let is slide the past month    Bariatric surgery status  Lab Results  Component Value Date   VITAMINB12 494 09/15/2023   Lab Results  Component Value Date   WBC 4.2 09/15/2023   HGB 11.6 (L) 09/15/2023   HCT 35.6 (L) 09/15/2023   MCV 82.5 09/15/2023   PLT 223.0 09/15/2023   Gives blood every 2 months       Patient Active Problem List   Diagnosis Date Noted   Symptomatic varicose veins, left 09/22/2023   Anxiety and depression 09/22/2023   Osteoarthrosis, hand 09/15/2022   OSA on CPAP 06/10/2021   Prediabetes 06/10/2021   Scoliosis 06/10/2021   Facial paresthesia 12/06/2019   History of colonic polyps    Endometrial hyperplasia 04/26/2019   OA (osteoarthritis) of knee 04/27/2017   Bariatric surgery status 03/06/2017   S/P shoulder replacement 10/16/2016   Esophagitis 10/02/2016   Failed total knee arthroplasty, 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   Failed total knee arthroplasty (HCC) 04/16/2016   Bilateral chronic knee pain 03/20/2016   Vitamin D  deficiency 03/05/2016   Essential hypertension 03/05/2016   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   Benign neoplasm of transverse colon    Hyperlipidemia 06/18/2010   Routine general medical examination at a health care facility 06/18/2010   Morbid obesity (HCC) 06/18/2010   Past Medical History:  Diagnosis Date   Anxiety    Arthritis    hands, knees, back   Cataract    Depression    Facial paresthesia    12-15-2019  per  pt just on left side of face/ head ,  has intermittant tingling / numbness, scheduled for MRI 12-27-2019 by her pcp , told 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   History of 2019 novel coronavirus disease (COVID-19) 02/28/2019   positive results in care everywhere;  12-15-2019  per pt mild symptoms resolved in just over 2 wks with no residual   History of hypertension    per pt hx dx HTN prior to gastric bypass 12/ 2018,  since lost wt no issue   Hyperlipidemia 06/18/2010   OSA (obstructive sleep apnea)    12-15-2019 per pt has not used cpap since 01/ 2021 stated does not need   PONV (postoperative nausea and vomiting)    PONV after knee arthroplasty   Right ureteral stone    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   Scoliosis    sees chiropracter monthly   Sleep apnea    Wears glasses    Past Surgical History:  Procedure Laterality Date   BREAST SURGERY  1999   breast biopsy   BUNIONECTOMY  2000   CESAREAN SECTION  1983; 1985   COLONOSCOPY WITH PROPOFOL  N/A 04/30/2015   Procedure: COLONOSCOPY WITH PROPOFOL ;  Surgeon: Rogelia Copping, MD;  Location: [facility];  Service: Endoscopy;  Laterality: N/A;   COLONOSCOPY WITH PROPOFOL  N/A 05/03/2019   Procedure: COLONOSCOPY WITH PROPOFOL ;  Surgeon: Copping Rogelia, MD;  Location: [facility];  Service: Endoscopy;  Laterality: N/A;  patient was COVID POSITIVE on 02/26/2019   CYSTOSCOPY WITH RETROGRADE PYELOGRAM, URETEROSCOPY AND STENT PLACEMENT Right 12/22/2019   Procedure: CYSTOSCOPY WITH RIGHT RETROGRADE PYELOGRAM, URETEROSCOPY WITH HOLMIUM LASER , BASKET EXTRACTION OF STONES AND STENT PLACEMENT;  Surgeon: Cam Morene ORN, MD;  Location: [facility];  Service: Urology;  Laterality: Right;   DIAGNOSTIC LAPAROSCOPY  01/19/2017   LOA w/ Gastropexy/ EGD   EXTRACORPOREAL SHOCK WAVE LITHOTRIPSY Right 08/22/2019   Procedure: RIGHT EXTRACORPOREAL SHOCK WAVE LITHOTRIPSY (ESWL);  Surgeon: Devere Lonni Righter, MD;  Location: [facility];  Service: Urology;  Laterality: Right;   EYE SURGERY     HYSTEROSCOPY WITH D & C  x2  last one 01/ 2017   HYSTEROSCOPY WITH D & C N/A 09/29/2022   Procedure: DILATATION AND CURETTAGE;  Surgeon: Connell Davies, MD;  Location: [facility];  Service: Gynecology;  Laterality: N/A;   IUD REMOVAL N/A 09/29/2022   Procedure: INTRAUTERINE DEVICE (IUD) REMOVAL;  Surgeon: Connell Davies, MD;  Location: [facility];  Service: Gynecology;  Laterality: N/A;   JOINT REPLACEMENT     KNEE ARTHROSCOPY Left 05/09/2015   Procedure: ARTHROSCOPY LEFT KNEE WITH SYNOVECTOMY;  Surgeon: Dempsey Moan, MD;  Location: [facility];  Service: Orthopedics;  Laterality: Left;   KNEE ARTHROSCOPY Left 04/27/2017   Procedure: Left knee arthroscopy; synovectomy;  Surgeon: Moan Dempsey, MD;  Location: [facility];  Service: Orthopedics;  Laterality: Left;   KNEE ARTHROSCOPY Left 2009   LAPAROSCOPIC GASTRIC SLEEVE RESECTION  01/14/2017   POLYPECTOMY  04/30/2015   Procedure: POLYPECTOMY;  Surgeon: Rogelia Copping, MD;  Location: [facility];  Service: Endoscopy;;   TONSILLECTOMY AND ADENOIDECTOMY  2007   TOTAL KNEE ARTHROPLASTY Right 04/27/2017   Procedure: RIGHT TOTAL KNEE ARTHROPLASTY;  Surgeon: Moan Dempsey, MD;  Location: [facility];  Service: Orthopedics;  Laterality: Right;   TOTAL KNEE ARTHROPLASTY Left 2014   TOTAL KNEE ARTHROPLASTY WITH REVISION COMPONENTS Left 04/16/2016   Procedure: LEFT TOTAL KNEE ARTHROPLASTY WITH POLY REVISION;  Surgeon: Dempsey Moan, MD;  Location: [facility];  Service: Orthopedics;  Laterality: Left;  with abductor block  TOTAL SHOULDER ARTHROPLASTY Right 10/16/2016   TOTAL SHOULDER ARTHROPLASTY Right 10/16/2016   Procedure: TOTAL SHOULDER ARTHROPLASTY;  Surgeon: Melita Drivers, MD;  Location: [facility];  Service: Orthopedics;  Laterality: Right;   TUBAL LIGATION  1986 & 1991   VEIN LIGATION AND STRIPPING  2000   Social History   Tobacco Use   Smoking status: Never   Smokeless tobacco: Never   Vaping Use   Vaping status: Never Used  Substance Use Topics   Alcohol use: Not Currently    Comment: rare   Drug use: Never   Family History  Problem Relation Age of Onset   Arthritis Mother    Hypertension Mother    Varicose Veins Mother    Arthritis Father    Cancer Father        prostate CA   Hypertension Father    Breast cancer Paternal Aunt    Cancer Paternal Aunt        breast cancer   Arthritis Maternal Grandmother    Hypertension Maternal Grandmother    Hearing loss Maternal Grandmother    Vision loss Maternal Grandmother    Arthritis Maternal Grandfather    Hypertension Maternal Grandfather    Diabetes Maternal Grandfather    Diabetes Brother    Anxiety disorder Brother    Depression Brother    No Known Allergies Current Outpatient Medications on File Prior to Visit  Medication Sig Dispense Refill   acetaminophen  (TYLENOL  8 HOUR) 650 MG CR tablet Take 1 tablet (650 mg total) by mouth every 8 (eight) hours as needed for pain. 30 tablet 0   ALPRAZolam  (XANAX ) 0.5 MG tablet Take 1 tablet (0.5 mg total) by mouth daily as needed for anxiety. 20 tablet 0   Calcium 500-100 MG-UNIT CHEW Chew by mouth.     Cholecalciferol (VITAMIN D ) 50 MCG (2000 UT) tablet Take 4,000 Units by mouth daily.     Cranberry 400 MG CAPS Take by mouth daily.      gabapentin  (NEURONTIN ) 300 MG capsule TAKE 3 CAPSULES BY MOUTH 3  TIMES DAILY 810 capsule 1   meloxicam  (MOBIC ) 15 MG tablet TAKE 1 TABLET BY MOUTH DAILY  WITH FOOD AS NEEDED FOR PAIN 100 tablet 0   Multiple Vitamins-Minerals (BARIATRIC MULTIVITAMINS/IRON) CAPS Take by mouth daily.     No current facility-administered medications on file prior to visit.    Review of Systems  Constitutional:  Negative for activity change, appetite change, fatigue, fever and unexpected weight change.  HENT:  Negative for congestion, ear pain, rhinorrhea, sinus pressure and sore throat.   Eyes:  Negative for pain, redness and visual disturbance.   Respiratory:  Negative for cough, shortness of breath and wheezing.   Cardiovascular:  Negative for chest pain and palpitations.  Gastrointestinal:  Negative for abdominal pain, blood in stool, constipation and diarrhea.  Endocrine: Negative for polydipsia and polyuria.  Genitourinary:  Negative for dysuria, frequency and urgency.  Musculoskeletal:  Negative for arthralgias, back pain and myalgias.  Skin:  Negative for pallor and rash.  Allergic/Immunologic: Negative for environmental allergies.  Neurological:  Negative for dizziness, syncope and headaches.  Hematological:  Negative for adenopathy. Does not bruise/bleed easily.  Psychiatric/Behavioral:  Negative for decreased concentration and dysphoric mood. The patient is not nervous/anxious.        Objective:   Physical Exam Constitutional:      General: She is not in acute distress.    Appearance: Normal appearance. She is well-developed. She is obese. She is not  ill-appearing or diaphoretic.  HENT:     Head: Normocephalic and atraumatic.     Right Ear: Tympanic membrane, ear canal and external ear normal.     Left Ear: Tympanic membrane, ear canal and external ear normal.     Nose: Nose normal. No congestion.     Mouth/Throat:     Mouth: Mucous membranes are moist.     Pharynx: Oropharynx is clear. No posterior oropharyngeal erythema.  Eyes:     General: No scleral icterus.    Extraocular Movements: Extraocular movements intact.     Conjunctiva/sclera: Conjunctivae normal.     Pupils: Pupils are equal, round, and reactive to light.  Neck:     Thyroid : No thyromegaly.     Vascular: No carotid bruit or JVD.  Cardiovascular:     Rate and Rhythm: Normal rate and regular rhythm.     Pulses: Normal pulses.     Heart sounds: Normal heart sounds.     No gallop.  Pulmonary:     Effort: Pulmonary effort is normal. No respiratory distress.     Breath sounds: Normal breath sounds. No wheezing.     Comments: Good air exch Chest:      Chest wall: No tenderness.  Abdominal:     General: Bowel sounds are normal. There is no distension or abdominal bruit.     Palpations: Abdomen is soft. There is no mass.     Tenderness: There is no abdominal tenderness.     Hernia: No hernia is present.  Genitourinary:    Comments: Breast exam: No mass, nodules, thickening, tenderness, bulging, retraction, inflamation, nipple discharge or skin changes noted.  No axillary or clavicular LA.     Musculoskeletal:        General: No tenderness. Normal range of motion.     Cervical back: Normal range of motion and neck supple. No rigidity. No muscular tenderness.     Right lower leg: No edema.     Left lower leg: No edema.     Comments: No kyphosis  Baseline scoliosis   Lymphadenopathy:     Cervical: No cervical adenopathy.  Skin:    General: Skin is warm and dry.     Coloration: Skin is not pale.     Findings: No erythema or rash.     Comments: Solar lentigines diffusely   Neurological:     Mental Status: She is alert. Mental status is at baseline.     Cranial Nerves: No cranial nerve deficit.     Motor: No abnormal muscle tone.     Coordination: Coordination normal.     Gait: Gait normal.     Deep Tendon Reflexes: Reflexes are normal and symmetric. Reflexes normal.  Psychiatric:        Mood and Affect: Mood normal.        Cognition and Memory: Cognition and memory normal.           Assessment & Plan:   Problem List Items Addressed This Visit       Cardiovascular and Mediastinum   Symptomatic varicose veins, left   With past surgery  Strongly encouraged pt to wear compression garment to prevent worsening and possible future skin and other complications Weight loss would also help      Essential hypertension   Blood pressure is up today BP: (!) 141/88   Is off track with diet  Did gain weight after bariatric surgery Encouraged to work on lifestyle change Follow up 1 mo  If still elevated will treat            Genitourinary   Endometrial hyperplasia   In past  Last US  was reassuring in may  Encouraged to continue gyn follow up  No longer has IUD         Other   Vitamin D  deficiency   Last vitamin D  Lab Results  Component Value Date   VD25OH 38.53 09/15/2023   Vitamin D  level is therapeutic with current supplementation Disc importance of this to bone and overall health       Routine general medical examination at a health care facility - Primary   Reviewed health habits including diet and exercise and skin cancer prevention Reviewed appropriate screening tests for age  Also reviewed health mt list, fam hx and immunization status , as well as social and family history   See HPI Labs reviewed and ordered Health Maintenance  Topic Date Due   Hepatitis C Screening  Never done   Flu Shot  05/03/2024*   COVID-19 Vaccine (4 - 2024-25 season) 10/07/2025*   Colon Cancer Screening  05/02/2024   Medicare Annual Wellness Visit  09/21/2024   Mammogram  06/22/2025   DTaP/Tdap/Td vaccine (2 - Td or Tdap) 03/07/2027   Pneumococcal Vaccine for age over 71  Completed   DEXA scan (bone density measurement)  Completed   Zoster (Shingles) Vaccine  Completed   HPV Vaccine  Aged Out   Meningitis B Vaccine  Aged Out  *Topic was postponed. The date shown is not the original due date.    Recommend follow up with gyn to make plan for endometrial hyperplasia  Colonoscopy due 04/2024  pt aware Discussed fall prevention, supplements and exercise for bone density  PHQ 0  Plan follow up for elevated blood pressure        Prediabetes   Prediabetes  Lab Results  Component Value Date   HGBA1C 6.0 09/15/2023   HGBA1C 5.6 09/11/2022   HGBA1C 5.5 08/28/2021    disc imp of low glycemic diet and wt loss to prevent DM2        Morbid obesity (HCC)   Discussed how this problem influences overall health and the risks it imposes  Reviewed plan for weight loss with lower calorie diet (via  better food choices (lower glycemic and portion control) along with exercise building up to or more than 30 minutes 5 days per week including some aerobic activity and strength training   Lost weight with bariatric surgery  Has gone up and down since then Commended water  exercise Encouraged strongly to get back on track with diet       Hyperlipidemia   Disc goals for lipids and reasons to control them Rev last labs with pt Rev low sat fat diet in detail Both LDL and HDL are up from a year ago         Bariatric surgery status   Watching labs/vit levels Last vitamin D  Lab Results  Component Value Date   VD25OH 38.53 09/15/2023   Lab Results  Component Value Date   VITAMINB12 494 09/15/2023    Has gained some weight back Encouraged to get back on track with diet       Anxiety and depression   Continues Citalopram  20 mg daily  Xanax  prn emergencies 0.5 mg   Doing well overall      Relevant Medications   citalopram  (CELEXA ) 20 MG tablet

## 2023-09-22 NOTE — Assessment & Plan Note (Signed)
 With past surgery  Strongly encouraged pt to wear compression garment to prevent worsening and possible future skin and other complications Weight loss would also help

## 2023-09-22 NOTE — Assessment & Plan Note (Signed)
 Blood pressure is up today BP: (!) 141/88   Is off track with diet  Did gain weight after bariatric surgery Encouraged to work on lifestyle change Follow up 1 mo  If still elevated will treat

## 2023-09-22 NOTE — Assessment & Plan Note (Signed)
 Discussed how this problem influences overall health and the risks it imposes  Reviewed plan for weight loss with lower calorie diet (via better food choices (lower glycemic and portion control) along with exercise building up to or more than 30 minutes 5 days per week including some aerobic activity and strength training   Lost weight with bariatric surgery  Has gone up and down since then Commended water  exercise Encouraged strongly to get back on track with diet

## 2023-10-02 DIAGNOSIS — Z9889 Other specified postprocedural states: Secondary | ICD-10-CM | POA: Diagnosis not present

## 2023-10-02 DIAGNOSIS — H2513 Age-related nuclear cataract, bilateral: Secondary | ICD-10-CM | POA: Diagnosis not present

## 2023-11-16 ENCOUNTER — Emergency Department
Admission: EM | Admit: 2023-11-16 | Discharge: 2023-11-17 | Disposition: A | Discharge status: Home / Self Care | Attending: Emergency Medicine | Admitting: Emergency Medicine

## 2023-11-16 ENCOUNTER — Other Ambulatory Visit: Payer: Self-pay

## 2023-11-16 ENCOUNTER — Emergency Department

## 2023-11-16 DIAGNOSIS — N390 Urinary tract infection, site not specified: Secondary | ICD-10-CM | POA: Diagnosis not present

## 2023-11-16 DIAGNOSIS — U071 COVID-19: Secondary | ICD-10-CM | POA: Diagnosis not present

## 2023-11-16 DIAGNOSIS — R059 Cough, unspecified: Secondary | ICD-10-CM | POA: Diagnosis not present

## 2023-11-16 DIAGNOSIS — Z96611 Presence of right artificial shoulder joint: Secondary | ICD-10-CM | POA: Diagnosis not present

## 2023-11-16 DIAGNOSIS — R5383 Other fatigue: Secondary | ICD-10-CM | POA: Diagnosis present

## 2023-11-16 DIAGNOSIS — I10 Essential (primary) hypertension: Secondary | ICD-10-CM | POA: Diagnosis not present

## 2023-11-16 DIAGNOSIS — R110 Nausea: Secondary | ICD-10-CM | POA: Diagnosis not present

## 2023-11-16 LAB — CBC
HCT: 35.5 % — ABNORMAL LOW (ref 36.0–46.0)
Hemoglobin: 11.4 g/dL — ABNORMAL LOW (ref 12.0–15.0)
MCH: 25.2 pg — ABNORMAL LOW (ref 26.0–34.0)
MCHC: 32.1 g/dL (ref 30.0–36.0)
MCV: 78.5 fL — ABNORMAL LOW (ref 80.0–100.0)
Platelets: 243 K/uL (ref 150–400)
RBC: 4.52 MIL/uL (ref 3.87–5.11)
RDW: 14.5 % (ref 11.5–15.5)
WBC: 6.3 K/uL (ref 4.0–10.5)
nRBC: 0 % (ref 0.0–0.2)

## 2023-11-16 LAB — COMPREHENSIVE METABOLIC PANEL WITH GFR
ALT: 15 U/L (ref 0–44)
AST: 26 U/L (ref 15–41)
Albumin: 3.9 g/dL (ref 3.5–5.0)
Alkaline Phosphatase: 74 U/L (ref 38–126)
Anion gap: 14 (ref 5–15)
BUN: 18 mg/dL (ref 8–23)
CO2: 22 mmol/L (ref 22–32)
Calcium: 8.8 mg/dL — ABNORMAL LOW (ref 8.9–10.3)
Chloride: 103 mmol/L (ref 98–111)
Creatinine, Ser: 0.82 mg/dL (ref 0.44–1.00)
GFR, Estimated: >60 mL/min (ref >60)
Glucose, Bld: 133 mg/dL — ABNORMAL HIGH (ref 70–99)
Potassium: 4 mmol/L (ref 3.5–5.1)
Sodium: 139 mmol/L (ref 135–145)
Total Bilirubin: 0.5 mg/dL (ref 0.0–1.2)
Total Protein: 7.1 g/dL (ref 6.5–8.1)

## 2023-11-16 LAB — RESP PANEL BY RT-PCR (RSV, FLU A&B, COVID)  RVPGX2
Influenza A by PCR: NEGATIVE
Influenza B by PCR: NEGATIVE
Resp Syncytial Virus by PCR: NEGATIVE
SARS Coronavirus 2 by RT PCR: POSITIVE — AB

## 2023-11-16 LAB — TROPONIN I (HIGH SENSITIVITY): Troponin I (High Sensitivity): 4 ng/L (ref <18)

## 2023-11-16 MED ORDER — ONDANSETRON 4 MG PO TBDP: 4.0000 mg | ORAL_TABLET | Freq: Three times a day (TID) | ORAL | 0 refills | Status: AC | PRN

## 2023-11-16 MED ORDER — NIRMATRELVIR/RITONAVIR (PAXLOVID)TABLET: 3.0000 | ORAL_TABLET | Freq: Two times a day (BID) | ORAL | 0 refills | Status: AC

## 2023-11-16 MED ORDER — KETOROLAC TROMETHAMINE 30 MG/ML IJ SOLN
30.0000 mg | Freq: Once | INTRAMUSCULAR | Status: AC
  Administered 2023-11-16: 30 mg via INTRAMUSCULAR
  Filled 2023-11-16: qty 1

## 2023-11-16 MED ORDER — ONDANSETRON 4 MG PO TBDP
4.0000 mg | ORAL_TABLET | Freq: Once | ORAL | Status: AC
  Administered 2023-11-16: 4 mg via ORAL
  Filled 2023-11-16: qty 1

## 2023-11-16 MED ORDER — BENZONATATE 100 MG PO CAPS: 100.0000 mg | ORAL_CAPSULE | Freq: Three times a day (TID) | ORAL | 0 refills | Status: AC | PRN

## 2023-11-16 MED ORDER — ACETAMINOPHEN 325 MG PO TABS
650.0000 mg | ORAL_TABLET | Freq: Once | ORAL | Status: AC | PRN
  Administered 2023-11-16: 650 mg via ORAL
  Filled 2023-11-16: qty 2

## 2023-11-16 NOTE — ED Provider Notes (Addendum)
 [facility] Provider Note    Event Date/Time   First MD Initiated Contact with Patient 11/16/23 2210     (approximate)   History   Nausea and Fatigue (/)   HPI  Katrina Huffman is a 68 y.o. female who comes in after waking up this morning feeling unwell and nauseous with fatigue.  Denies any abdominal pain.  She does report having COVID previously but this was years ago.  She has had her COVID vaccines.  She does report a little bit of a cough.  Does not really have any obvious shortness of breath or chest pain.  She would just reports not being able to eat and drink very well and having a headache.  She denies any falls or hitting her head and this is a typical headache that she has had when she had had COVID previously.  Not the worst headache of her life.     Physical Exam   Triage Vital Signs: ED Triage Vitals [11/16/23 1807]  Encounter Vitals Group     BP 138/77     Girls Systolic BP Percentile      Girls Diastolic BP Percentile      Boys Systolic BP Percentile      Boys Diastolic BP Percentile      Pulse Rate 94     Resp 18     Temp (!) 102.2 F (39 C)     Temp Source Oral     SpO2 98 %     Weight      Height      Head Circumference      Peak Flow      Pain Score 0     Pain Loc      Pain Education      Exclude from Growth Chart     Most recent vital signs: Vitals:   11/16/23 1807  BP: 138/77  Pulse: 94  Resp: 18  Temp: (!) 102.2 F (39 C)  SpO2: 98%     General: Awake, no distress.  CV:  Good peripheral perfusion.  Resp:  Normal effort.  Abd:  No distention.  Soft and nontender Other:  Cranial nerves appear intact.  Equal strength in arms and legs.   ED Results / Procedures / Treatments   Labs (all labs ordered are listed, but only abnormal results are displayed) Labs Reviewed  RESP PANEL BY RT-PCR (RSV, FLU A&B, COVID)  RVPGX2 - Abnormal; Notable for the following components:      Result Value   SARS Coronavirus 2  by RT PCR POSITIVE (*)    All other components within normal limits  CBC - Abnormal; Notable for the following components:   Hemoglobin 11.4 (*)    HCT 35.5 (*)    MCV 78.5 (*)    MCH 25.2 (*)    All other components within normal limits  COMPREHENSIVE METABOLIC PANEL WITH GFR - Abnormal; Notable for the following components:   Glucose, Bld 133 (*)    Calcium 8.8 (*)    All other components within normal limits  URINALYSIS, ROUTINE W REFLEX MICROSCOPIC     EKG  My interpretation of EKG:  Normal sinus rate of 79 without any ST elevation or T wave inversions, normal intervals  RADIOLOGY I have reviewed the xray personally and interpreted no evidence of any pneumonia   PROCEDURES:  Critical Care performed: No  Procedures   MEDICATIONS ORDERED IN ED: Medications  acetaminophen  (TYLENOL ) tablet 650  mg (650 mg Oral Given 11/16/23 1811)  ondansetron  (ZOFRAN -ODT) disintegrating tablet 4 mg (4 mg Oral Given 11/16/23 2254)  ketorolac  (TORADOL ) 30 MG/ML injection 30 mg (30 mg Intramuscular Given 11/16/23 2253)     IMPRESSION / MDM / ASSESSMENT AND PLAN / ED COURSE  I reviewed the triage vital signs and the nursing notes.   Patient's presentation is most consistent with acute presentation with potential threat to life or bodily function.   Patient comes in febrile.  Workup done to evaluate for pneumonia, COVID, flu, UTI.  Abdomen soft and nontender  COVID test is positive.  CBC shows stable hemoglobin no white count elevation.  CMP shows normal creatinine.  Troponin was not negative.  Discussed with patient antiviral medication and she did want to proceed with this.  Patient treated symptomatically with Toradol , Zofran  and will do p.o. challenge.  Patient's chest x-ray is reassuring she is not hypoxic we discussed return precautions if symptoms are changing and she expressed understanding.  However patient will be discharged after urine (given some increase frequency) and  p.o. challenge  Patient denies being on any antilipid medications or blood pressure medications that would affect the Paxlovid.  Her creatinine is normal  11:48 PM patient feeling much improved after medications and will try p.o. challenge and give a urine sample to ensure no other infectious process  The patient is on the cardiac monitor to evaluate for evidence of arrhythmia and/or significant heart rate changes.      FINAL CLINICAL IMPRESSION(S) / ED DIAGNOSES   Final diagnoses:  COVID-19     Rx / DC Orders   ED Discharge Orders          Ordered    ondansetron  (ZOFRAN -ODT) 4 MG disintegrating tablet  Every 8 hours PRN        11/16/23 2322    benzonatate  (TESSALON  PERLES) 100 MG capsule  3 times daily PRN        11/16/23 2322    nirmatrelvir/ritonavir (PAXLOVID) 20 x 150 MG & 10 x 100MG  TABS  2 times daily        11/16/23 2322             Note:  This document was prepared using Dragon voice recognition software and may include unintentional dictation errors.   Ernest Ronal BRAVO, MD 11/16/23 [id number]    Ernest Ronal BRAVO, MD 11/16/23 [phone]

## 2023-11-16 NOTE — ED Notes (Signed)
 Pt given a cup of water at this time.

## 2023-11-16 NOTE — ED Notes (Signed)
 Pt unable to urinate at this time, specimen cup within reach of pt, pt states she will sip on water  then try to urinate.

## 2023-11-16 NOTE — ED Triage Notes (Signed)
 Pt to ED via POV from home. Pt reports woke up this morning and reports feeling unwell and feeling really nauseas and fatigued. Denies abd pain.

## 2023-11-16 NOTE — ED Provider Notes (Signed)
-----------------------------------------   11:39 PM on 11/16/2023 -----------------------------------------  Assuming care from Dr. Ernest.  In short, Katrina Huffman is a 68 y.o. female with a chief complaint of fever.  Refer to the original H&P for additional details.  The current plan of care is to check UA.  Patient also has COVID.  Anticipate discharge w/ or w/o antibiotics.   Clinical Course as of 11/17/23 0041  Tue Nov 17, 2023  0038 Urinalysis, Routine w reflex microscopic -Urine, Clean Catch(!) Urinalysis appears positive given the large leukocytes, many bacteria, and greater than 50 WBCs and WBC clumps.  I am sending urine culture and treating empirically with a dose of Keflex  here and an outpatient course of cefadroxil.  I updated the patient and her husband and they are comfortable with the plan for discharge and outpatient follow-up.  I gave my usual and customary follow-up recommendations and return precautions. [CF]    Clinical Course User Index [CF] Gordan Huxley, MD     Medications  cephALEXin  (KEFLEX ) capsule 500 mg (has no administration in time range)  acetaminophen  (TYLENOL ) tablet 650 mg (650 mg Oral Given 11/16/23 1811)  ondansetron  (ZOFRAN -ODT) disintegrating tablet 4 mg (4 mg Oral Given 11/16/23 2254)  ketorolac  (TORADOL ) 30 MG/ML injection 30 mg (30 mg Intramuscular Given 11/16/23 2253)     ED Discharge Orders          Ordered    cefadroxil (DURICEF) 500 MG capsule  2 times daily        11/17/23 0040    ondansetron  (ZOFRAN -ODT) 4 MG disintegrating tablet  Every 8 hours PRN        11/16/23 2322    benzonatate  (TESSALON  PERLES) 100 MG capsule  3 times daily PRN        11/16/23 2322    nirmatrelvir/ritonavir (PAXLOVID) 20 x 150 MG & 10 x 100MG  TABS  2 times daily        11/16/23 2322           Final diagnoses:  COVID-19  Urinary tract infection without hematuria, site unspecified     Gordan Huxley, MD 11/17/23 0041

## 2023-11-16 NOTE — Discharge Instructions (Addendum)
 Stay quarantine. Take Tylenol  1 g every 8 hours and alternate with ibuprofen 600 every 8 hours with food to help with any fevers, muscle aches for up to 1 week. Take Zofran  to help with nausea and stay well-hydrated with Pedialyte, Gatorade without sugar You start the antiviral medication Paxlovid to try to help decrease hospital admission Please also take the full course of antibiotics (cefadroxil) that you are prescribed for your urinary tract infection.  Do not stop the medication early unless otherwise directed by another physician If you develop continued issues with tolerating eating and drinking, worsening shortness of breath and please return to the ER for repeat evaluation

## 2023-11-17 LAB — URINALYSIS, ROUTINE W REFLEX MICROSCOPIC
Bilirubin Urine: NEGATIVE
Glucose, UA: NEGATIVE mg/dL
Hgb urine dipstick: NEGATIVE
Ketones, ur: NEGATIVE mg/dL
Nitrite: NEGATIVE
Protein, ur: 30 mg/dL — AB
Specific Gravity, Urine: 1.02 (ref 1.005–1.030)
WBC, UA: >50 WBC/hpf (ref 0–5)
pH: 5 (ref 5.0–8.0)

## 2023-11-17 MED ORDER — CEFADROXIL 500 MG PO CAPS: 500.0000 mg | ORAL_CAPSULE | Freq: Two times a day (BID) | ORAL | 0 refills | Status: AC

## 2023-11-17 MED ORDER — CEPHALEXIN 500 MG PO CAPS
500.0000 mg | ORAL_CAPSULE | Freq: Once | ORAL | Status: AC
  Administered 2023-11-17: 500 mg via ORAL
  Filled 2023-11-17: qty 1

## 2023-11-17 NOTE — ED Notes (Signed)
Pt able to provide urine sample

## 2023-11-19 LAB — URINE CULTURE: Culture: >=100000 — AB

## 2023-11-23 ENCOUNTER — Encounter: Payer: Self-pay | Admitting: Family Medicine

## 2023-11-23 ENCOUNTER — Ambulatory Visit (INDEPENDENT_AMBULATORY_CARE_PROVIDER_SITE_OTHER): Admitting: Family Medicine

## 2023-11-23 ENCOUNTER — Ambulatory Visit: Admitting: Family Medicine

## 2023-11-23 VITALS — BP 119/70 | HR 77 | Temp 98.1°F | Ht 64.25 in | Wt 223.4 lb

## 2023-11-23 DIAGNOSIS — I10 Essential (primary) hypertension: Secondary | ICD-10-CM

## 2023-11-23 DIAGNOSIS — Z96611 Presence of right artificial shoulder joint: Secondary | ICD-10-CM | POA: Diagnosis not present

## 2023-11-23 DIAGNOSIS — Z23 Encounter for immunization: Secondary | ICD-10-CM | POA: Diagnosis not present

## 2023-11-23 DIAGNOSIS — M25512 Pain in left shoulder: Secondary | ICD-10-CM | POA: Diagnosis not present

## 2023-11-23 NOTE — Progress Notes (Signed)
 Subjective:    Patient ID: Katrina Huffman, female    DOB: [birth year], 68 y.o.   MRN: [id number]  HPI  Wt Readings from Last 3 Encounters:  11/23/23 223 lb 6 oz (101.3 kg)  09/22/23 224 lb 4 oz (101.7 kg)  06/30/23 227 lb (103 kg)   38.04 kg/m  Vitals:   11/23/23 0803 11/23/23 0817  BP: 126/84 119/70  Pulse: 77   Temp: 98.1 F (36.7 C)   SpO2: 97%      Had ER visit for covid on 10/13  Is feeling better  Going back to work today  Also had a uti   Planning to retire in December  Excited about that     Pt presents for follow up of HTN   HTN bp is stable today  No cp or palpitations or headaches or edema  No side medicines curently  BP Readings from Last 3 Encounters:  11/23/23 119/70  11/17/23 130/72  09/22/23 (!) 141/88    Last visit blood pressure was up after some weight gain   Since then tried to eat better   She stopped going to water  aerobics since it was dark when she got out  Pool is too cold   Lab Results  Component Value Date   NA 139 11/16/2023   K 4.0 11/16/2023   CO2 22 11/16/2023   GLUCOSE 133 (H) 11/16/2023   BUN 18 11/16/2023   CREATININE 0.82 11/16/2023   CALCIUM 8.8 (L) 11/16/2023   GFR 89.06 09/15/2023   GFRNONAA >60 11/16/2023    Has ortho appointment later today for left shoulder  Dr Melita     Patient Active Problem List   Diagnosis Date Noted   Symptomatic varicose veins, left 09/22/2023   Anxiety and depression 09/22/2023   Osteoarthrosis, hand 09/15/2022   OSA on CPAP 06/10/2021   Prediabetes 06/10/2021   Scoliosis 06/10/2021   Facial paresthesia 12/06/2019   History of colonic polyps    Endometrial hyperplasia 04/26/2019   OA (osteoarthritis) of knee 04/27/2017   Bariatric surgery status 03/06/2017   S/P shoulder replacement 10/16/2016   Esophagitis 10/02/2016   Failed total knee arthroplasty, 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   Failed total knee arthroplasty 04/16/2016   Bilateral chronic knee pain 03/20/2016   Vitamin  D deficiency 03/05/2016   Essential hypertension 03/05/2016   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   Benign neoplasm of transverse colon    Hyperlipidemia 06/18/2010   Routine general medical examination at a health care facility 06/18/2010   Morbid obesity (HCC) 06/18/2010   Past Medical History:  Diagnosis Date   Anxiety    Arthritis    hands, knees, back   Cataract    Depression    Facial paresthesia    12-15-2019  per pt just on left side of face/ head ,  has intermittant tingling / numbness, scheduled for MRI 12-27-2019 by her pcp , told 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   History of 2019 novel coronavirus disease (COVID-19) 02/28/2019   positive results in care everywhere;  12-15-2019  per pt mild symptoms resolved in just over 2 wks with no residual   History of hypertension    per pt hx dx HTN prior to gastric bypass 12/ 2018,  since lost wt no issue   Hyperlipidemia 06/18/2010   OSA (obstructive sleep apnea)    12-15-2019 per pt has not used cpap since 01/ 2021 stated does not need   PONV (postoperative nausea and vomiting)  PONV after knee arthroplasty   Right ureteral stone    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   Scoliosis    sees chiropracter monthly   Sleep apnea    Wears glasses    Past Surgical History:  Procedure Laterality Date   BREAST SURGERY  1999   breast biopsy   BUNIONECTOMY  2000   CESAREAN SECTION  1983; 1985   COLONOSCOPY WITH PROPOFOL  N/A 04/30/2015   Procedure: COLONOSCOPY WITH PROPOFOL ;  Surgeon: Rogelia Copping, MD;  Location: [facility];  Service: Endoscopy;  Laterality: N/A;   COLONOSCOPY WITH PROPOFOL  N/A 05/03/2019   Procedure: COLONOSCOPY WITH PROPOFOL ;  Surgeon: Copping Rogelia, MD;  Location: [facility];  Service: Endoscopy;  Laterality: N/A;  patient was COVID POSITIVE on 02/26/2019   CYSTOSCOPY WITH RETROGRADE PYELOGRAM, URETEROSCOPY AND STENT PLACEMENT Right 12/22/2019   Procedure: CYSTOSCOPY  WITH RIGHT RETROGRADE PYELOGRAM, URETEROSCOPY WITH HOLMIUM LASER , BASKET EXTRACTION OF STONES AND STENT PLACEMENT;  Surgeon: Cam Morene ORN, MD;  Location: [facility];  Service: Urology;  Laterality: Right;   DIAGNOSTIC LAPAROSCOPY  01/19/2017   LOA w/ Gastropexy/ EGD   EXTRACORPOREAL SHOCK WAVE LITHOTRIPSY Right 08/22/2019   Procedure: RIGHT EXTRACORPOREAL SHOCK WAVE LITHOTRIPSY (ESWL);  Surgeon: Devere Lonni Righter, MD;  Location: [facility];  Service: Urology;  Laterality: Right;   EYE SURGERY     HYSTEROSCOPY WITH D & C  x2  last one 01/ 2017   HYSTEROSCOPY WITH D & C N/A 09/29/2022   Procedure: DILATATION AND CURETTAGE;  Surgeon: Connell Davies, MD;  Location: [facility];  Service: Gynecology;  Laterality: N/A;   IUD REMOVAL N/A 09/29/2022   Procedure: INTRAUTERINE DEVICE (IUD) REMOVAL;  Surgeon: Connell Davies, MD;  Location: [facility];  Service: Gynecology;  Laterality: N/A;   JOINT REPLACEMENT     KNEE ARTHROSCOPY Left 05/09/2015   Procedure: ARTHROSCOPY LEFT KNEE WITH SYNOVECTOMY;  Surgeon: Dempsey Moan, MD;  Location: [facility];  Service: Orthopedics;  Laterality: Left;   KNEE ARTHROSCOPY Left 04/27/2017   Procedure: Left knee arthroscopy; synovectomy;  Surgeon: Moan Dempsey, MD;  Location: [facility];  Service: Orthopedics;  Laterality: Left;   KNEE ARTHROSCOPY Left 2009   LAPAROSCOPIC GASTRIC SLEEVE RESECTION  01/14/2017   POLYPECTOMY  04/30/2015   Procedure: POLYPECTOMY;  Surgeon: Rogelia Copping, MD;  Location: [facility];  Service: Endoscopy;;   TONSILLECTOMY AND ADENOIDECTOMY  2007   TOTAL KNEE ARTHROPLASTY Right 04/27/2017   Procedure: RIGHT TOTAL KNEE ARTHROPLASTY;  Surgeon: Moan Dempsey, MD;  Location: [facility];  Service: Orthopedics;  Laterality: Right;   TOTAL KNEE ARTHROPLASTY Left 2014   TOTAL KNEE ARTHROPLASTY WITH REVISION COMPONENTS Left 04/16/2016   Procedure: LEFT TOTAL KNEE ARTHROPLASTY WITH POLY REVISION;  Surgeon: Dempsey Moan, MD;  Location: [facility];  Service: Orthopedics;  Laterality: Left;  with abductor block   TOTAL SHOULDER ARTHROPLASTY Right 10/16/2016   TOTAL SHOULDER ARTHROPLASTY Right 10/16/2016   Procedure: TOTAL SHOULDER ARTHROPLASTY;  Surgeon: Melita Drivers, MD;  Location: [facility];  Service: Orthopedics;  Laterality: Right;   TUBAL LIGATION  1986 & 1991   VEIN LIGATION AND STRIPPING  2000   Social History   Tobacco Use   Smoking status: Never   Smokeless tobacco: Never  Vaping Use   Vaping status: Never Used  Substance Use Topics   Alcohol use: Not Currently    Comment: rare   Drug use: Never   Family History  Problem Relation Age of Onset   Arthritis  Mother    Hypertension Mother    Varicose Veins Mother    Arthritis Father    Cancer Father        prostate CA   Hypertension Father    Breast cancer Paternal Aunt    Cancer Paternal Aunt        breast cancer   Arthritis Maternal Grandmother    Hypertension Maternal Grandmother    Hearing loss Maternal Grandmother    Vision loss Maternal Grandmother    Arthritis Maternal Grandfather    Hypertension Maternal Grandfather    Diabetes Maternal Grandfather    Diabetes Brother    Anxiety disorder Brother    Depression Brother    No Known Allergies Current Outpatient Medications on File Prior to Visit  Medication Sig Dispense Refill   acetaminophen  (TYLENOL  8 HOUR) 650 MG CR tablet Take 1 tablet (650 mg total) by mouth every 8 (eight) hours as needed for pain. 30 tablet 0   ALPRAZolam  (XANAX ) 0.5 MG tablet Take 1 tablet (0.5 mg total) by mouth daily as needed for anxiety. 20 tablet 0   benzonatate  (TESSALON  PERLES) 100 MG capsule Take 1 capsule (100 mg total) by mouth 3 (three) times daily as needed for cough. 30 capsule 0   Calcium 500-100 MG-UNIT CHEW Chew by mouth.     cefadroxil (DURICEF) 500 MG capsule Take 1 capsule (500 mg total) by mouth 2 (two) times daily for 7 days. 14 capsule 0   Cholecalciferol (VITAMIN D ) 50 MCG (2000  UT) tablet Take 4,000 Units by mouth daily.     citalopram  (CELEXA ) 20 MG tablet Take 1 tablet (20 mg total) by mouth daily. 90 tablet 3   Cranberry 400 MG CAPS Take by mouth daily.      gabapentin  (NEURONTIN ) 300 MG capsule TAKE 3 CAPSULES BY MOUTH 3  TIMES DAILY 810 capsule 1   meloxicam  (MOBIC ) 15 MG tablet TAKE 1 TABLET BY MOUTH DAILY  WITH FOOD AS NEEDED FOR PAIN 100 tablet 0   Multiple Vitamins-Minerals (BARIATRIC MULTIVITAMINS/IRON) CAPS Take by mouth daily.     No current facility-administered medications on file prior to visit.    Review of Systems  Constitutional:  Positive for fatigue. Negative for activity change, appetite change, fever and unexpected weight change.       Getting over covid Feeling better Some fatigue   HENT:  Negative for congestion, rhinorrhea, sore throat and trouble swallowing.   Eyes:  Negative for pain, redness, itching and visual disturbance.  Respiratory:  Positive for cough. Negative for chest tightness, shortness of breath and wheezing.   Cardiovascular:  Negative for chest pain and palpitations.  Gastrointestinal:  Negative for abdominal pain, blood in stool, constipation, diarrhea and nausea.  Endocrine: Negative for cold intolerance, heat intolerance, polydipsia and polyuria.  Genitourinary:  Negative for difficulty urinating, dysuria, frequency and urgency.  Musculoskeletal:  Negative for arthralgias, joint swelling and myalgias.  Skin:  Negative for pallor and rash.  Neurological:  Negative for dizziness, tremors, weakness, numbness and headaches.  Hematological:  Negative for adenopathy. Does not bruise/bleed easily.  Psychiatric/Behavioral:  Negative for decreased concentration and dysphoric mood. The patient is not nervous/anxious.        Objective:   Physical Exam Constitutional:      General: She is not in acute distress.    Appearance: Normal appearance. She is well-developed. She is obese. She is not ill-appearing or diaphoretic.   HENT:     Head: Normocephalic and atraumatic.  Eyes:  Conjunctiva/sclera: Conjunctivae normal.     Pupils: Pupils are equal, round, and reactive to light.  Neck:     Thyroid : No thyromegaly.     Vascular: No carotid bruit or JVD.  Cardiovascular:     Rate and Rhythm: Normal rate and regular rhythm.     Heart sounds: Normal heart sounds.     No gallop.  Pulmonary:     Effort: Pulmonary effort is normal. No respiratory distress.     Breath sounds: Normal breath sounds. No stridor. No wheezing, rhonchi or rales.  Abdominal:     General: There is no distension or abdominal bruit.     Palpations: Abdomen is soft.  Musculoskeletal:     Cervical back: Normal range of motion and neck supple.     Right lower leg: No edema.     Left lower leg: No edema.  Lymphadenopathy:     Cervical: No cervical adenopathy.  Skin:    General: Skin is warm and dry.     Coloration: Skin is not pale.     Findings: No rash.  Neurological:     Mental Status: She is alert.     Coordination: Coordination normal.     Deep Tendon Reflexes: Reflexes are normal and symmetric. Reflexes normal.  Psychiatric:        Mood and Affect: Mood normal.           Assessment & Plan:   Problem List Items Addressed This Visit       Cardiovascular and Mediastinum   Essential hypertension - Primary   bp in fair control at this time  Improved from last visit  BP Readings from Last 1 Encounters:  11/23/23 119/70   No changes needed (controlling with lifestyle change, no medication)  Most recent labs reviewed  Disc lifstyle change with low sodium diet and exercise           Other   Morbid obesity (HCC)   Working on weight loss  Bmi of 38.0 with co morbidities of HTN, prediabetes and hyperlipidemia Weight down 4 lb since may  Had to stop water  exercise for winter-plans to start chair yoga  Just got over covid  Discussed how this problem influences overall health and the risks it imposes  Reviewed  plan for weight loss with lower calorie diet (via better food choices (lower glycemic and portion control) along with exercise building up to or more than 30 minutes 5 days per week including some aerobic activity and strength training

## 2023-11-23 NOTE — Patient Instructions (Signed)
 Flu shot today   Blood pressure is better   Keep working on healthy diet and exercise

## 2023-11-23 NOTE — Assessment & Plan Note (Addendum)
 Working on weight loss  Bmi of 38.0 with co morbidities of HTN, prediabetes and hyperlipidemia Weight down 4 lb since may  Had to stop water  exercise for winter-plans to start chair yoga  Just got over covid  Discussed how this problem influences overall health and the risks it imposes  Reviewed plan for weight loss with lower calorie diet (via better food choices (lower glycemic and portion control) along with exercise building up to or more than 30 minutes 5 days per week including some aerobic activity and strength training

## 2023-11-23 NOTE — Assessment & Plan Note (Signed)
 bp in fair control at this time  Improved from last visit  BP Readings from Last 1 Encounters:  11/23/23 119/70   No changes needed (controlling with lifestyle change, no medication)  Most recent labs reviewed  Disc lifstyle change with low sodium diet and exercise

## 2024-01-12 ENCOUNTER — Telehealth: Payer: Self-pay

## 2024-01-12 NOTE — Telephone Encounter (Signed)
 Pt LVM on Wednesday 01/06/24 requesting to schedule her colonoscopy for March or April.  Chart reviewed.  Her last colonoscopy was performed by Dr. Jinny 05/04/19. He recommended repeat in 5 years.  She will be due after 05/03/24.  I will call her to schedule in February to schedule.  Thanks,  Hammond, CMA

## 2024-02-05 ENCOUNTER — Ambulatory Visit: Payer: Self-pay

## 2024-02-05 NOTE — Telephone Encounter (Signed)
 FYI Only or Action Required?: FYI only for provider: appointment scheduled on 1.3.26 scheduled with UC for cough symptoms. Advised pt she may need an in office appt for urinary symptoms.  Patient was last seen in primary care on 11/23/2023 by Randeen Laine LABOR, MD.  Called Nurse Triage reporting Cough and Urinary Frequency.  Symptoms began several days ago.  Interventions attempted: Rest, hydration, or home remedies.  Symptoms are: gradually worsening.  Triage Disposition: See PCP Within 2 Weeks, See Physician Within 24 Hours  Patient/caregiver understands and will follow disposition?: Yes

## 2024-02-05 NOTE — Telephone Encounter (Signed)
 Attempt # 1 to reach patient to triage symptoms. Left VM to call back    Message from [facility] C sent at 02/05/2024  3:31 PM EST  Summary: Possible Bronchitis or UTI   Reason for Triage: Possible bronchitis- loss of voice, not getting any better; request if [employer] can be called in. for patient Congestion. No pain. No shortness of breath. Patient may also be experiencing UTI symptoms and is asking if something can be called in.   [phone] (M)

## 2024-02-05 NOTE — Telephone Encounter (Signed)
" °  Reason for Disposition  [1] Continuous (nonstop) coughing interferes with work or school AND [2] no improvement using cough treatment per Care Advice  Has to get out of bed to urinate > 2 times a night (i.e., nocturia)  Answer Assessment - Initial Assessment Questions 3rd day of symptoms, no voice, started with a sore throat and then a cough. States she feels like it is bronchitis because her lungs feel clear. She states she is coughing stuff up but doesn't know what is looks like as she swallows it. She did have covid and uti in October. She states she does still have some lingering symptoms like cloudy urine and going more frequently. NO pain with urination. She did however just start drinking water  yesterday, states that was her new years resolution. She does say she has some chest heaviness with coughing.  Denies any higher acuity symptoms. Scheduled with virtual UC. RN did advise if urinary symptoms continue will need to set up an in office appt to get urine specimen. But given cough symptoms, that should be evaluated sooner than later. Pt was agreeable to this. Rn did give instructions on when to go to ER instead.       1. ONSET: When did the cough begin?      Tuesday 2. SEVERITY: How bad is the cough today?      Moderate to severe 3. SPUTUM: Describe the color of your sputum (e.g., none, dry cough; clear, white, yellow, green)     unknown 4. HEMOPTYSIS: Are you coughing up any blood? If Yes, ask: How much? (e.g., flecks, streaks, tablespoons, etc.)     Denies 5. DIFFICULTY BREATHING: Are you having difficulty breathing? If Yes, ask: How bad is it? (e.g., mild, moderate, severe)      denies 6. FEVER: Do you have a fever? If Yes, ask: What is your temperature, how was it measured, and when did it start?     denies 7. CARDIAC HISTORY: Do you have any history of heart disease? (e.g., heart attack, congestive heart failure)      htn 8. LUNG HISTORY: Do you have any  history of lung disease?  (e.g., pulmonary embolus, asthma, emphysema)     no 10. OTHER SYMPTOMS: Do you have any other symptoms? (e.g., runny nose, wheezing, chest pain)       Wheezing with laying down  Answer Assessment - Initial Assessment Questions 1. SYMPTOM: What's the main symptom you're concerned about? (e.g., frequency, incontinence)     Night time frequency, cloudiness 2. ONSET: When did the  symptoms  start?     october 3. PAIN: Is there any pain? If Yes, ask: How bad is it? (Scale: 1-10; mild, moderate, severe)     denies 4. CAUSE: What do you think is causing the symptoms?     unknown 5. OTHER SYMPTOMS: Do you have any other symptoms? (e.g., blood in urine, fever, flank pain, pain with urination)     States she had a catch on her left side when standing the other day but nothing else  Protocols used: Cough - Acute Productive-A-AH, Urinary Symptoms-A-AH  "

## 2024-02-06 ENCOUNTER — Telehealth: Admitting: Family Medicine

## 2024-02-06 DIAGNOSIS — N3000 Acute cystitis without hematuria: Secondary | ICD-10-CM

## 2024-02-06 DIAGNOSIS — J069 Acute upper respiratory infection, unspecified: Secondary | ICD-10-CM | POA: Diagnosis not present

## 2024-02-06 MED ORDER — LEVOFLOXACIN 500 MG PO TABS: 500.0000 mg | ORAL_TABLET | Freq: Every day | ORAL | 0 refills | Status: AC

## 2024-02-06 MED ORDER — PROMETHAZINE-DM 6.25-15 MG/5ML PO SYRP: 5.0000 mL | ORAL_SOLUTION | Freq: Four times a day (QID) | ORAL | 0 refills | Status: AC | PRN

## 2024-02-06 MED ORDER — PREDNISONE 20 MG PO TABS: 20.0000 mg | ORAL_TABLET | Freq: Two times a day (BID) | ORAL | 0 refills | Status: AC

## 2024-02-06 NOTE — Progress Notes (Signed)
 " Virtual Visit Consent   Katrina Huffman, you are scheduled for a virtual visit with a [facility] provider today. Just as with appointments in the office, your consent must be obtained to participate. Your consent will be active for this visit and any virtual visit you may have with one of our providers in the next 365 days. If you have a MyChart account, a copy of this consent can be sent to you electronically.  As this is a virtual visit, video technology does not allow for your provider to perform a traditional examination. This may limit your provider's ability to fully assess your condition. If your provider identifies any concerns that need to be evaluated in person or the need to arrange testing (such as labs, EKG, etc.), we will make arrangements to do so. Although advances in technology are sophisticated, we cannot ensure that it will always work on either your end or our end. If the connection with a video visit is poor, the visit may have to be switched to a telephone visit. With either a video or telephone visit, we are not always able to ensure that we have a secure connection.  By engaging in this virtual visit, you consent to the provision of healthcare and authorize for your insurance to be billed (if applicable) for the services provided during this visit. Depending on your insurance coverage, you may receive a charge related to this service.  I need to obtain your verbal consent now. Are you willing to proceed with your visit today? Katrina Huffman has provided verbal consent on 02/06/2024 for a virtual visit (video or telephone). Loa Lamp, FNP  Date: 02/06/2024 11:49 AM   Virtual Visit via Video Note   I, Loa Lamp, connected with  Katrina Huffman  ([id number], [birth year]) on 02/06/2024 at 11:45 AM EST by a video-enabled telemedicine application and verified that I am speaking with the correct person using two identifiers.  Location: Patient: Virtual Visit Location Patient:  Home Provider: Virtual Visit Location Provider: Home Office   I discussed the limitations of evaluation and management by telemedicine and the availability of in person appointments. The patient expressed understanding and agreed to proceed.    History of Present Illness: Katrina Huffman is a 69 y.o. who identifies as a female who was assigned female at birth, and is being seen today for 5-6 days of cough, wheezing, not sob, no fever, head congestion headache. Sx persistent. She has frequency of urination. No burning. Urine is cloudy Getting up at night to urinate. No abd pain.   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  Resting comfortably  at home.  Head is normocephalic, atraumatic.  No labored breathing.  Speech is clear and coherent with logical content.  Patient is alert and oriented at baseline.    Assessment and  Plan: 1. Upper respiratory tract infection, unspecified type (Primary)  2. Acute cystitis without hematuria  Increase fluids, UTI prevention discussed, UC as needed, Humidifier at night .  Follow Up Instructions: I discussed the assessment and treatment plan with the patient. The patient was provided an opportunity to ask questions and all were answered. The patient agreed with the plan and demonstrated an understanding of the instructions.  A copy of instructions were sent to the patient via MyChart unless otherwise noted below.     The patient was advised to call back or seek an in-person evaluation if the symptoms worsen or if the condition fails to improve as anticipated.    Braelyn Bordonaro, FNP      [1] No Known Allergies [2]  Current Outpatient Medications:    levofloxacin  (LEVAQUIN ) 500 MG tablet, Take 1 tablet (500 mg total) by mouth daily for 7 days., Disp: 7 tablet, Rfl: 0   predniSONE  (DELTASONE ) 20 MG tablet, Take 1 tablet (20 mg total) by mouth 2 (two) times daily with a meal for 5 days., Disp: 10 tablet, Rfl: 0   promethazine -dextromethorphan (PROMETHAZINE -DM) 6.25-15 MG/5ML syrup, Take 5 mLs by mouth 4 (four) times daily as needed for up to 10 days for cough., Disp: 118 mL, Rfl: 0   acetaminophen  (TYLENOL  8 HOUR) 650 MG CR tablet, Take 1 tablet (650 mg total) by mouth every 8 (eight) hours as needed for pain., Disp: 30 tablet, Rfl: 0   ALPRAZolam  (XANAX ) 0.5 MG tablet, Take 1 tablet (0.5 mg total) by mouth daily as needed for anxiety., Disp: 20 tablet, Rfl: 0   benzonatate  (TESSALON  PERLES) 100 MG capsule, Take 1 capsule (100 mg total) by mouth 3 (three) times daily as needed for cough., Disp: 30 capsule, Rfl: 0   Calcium 500-100 MG-UNIT CHEW, Chew by mouth., Disp: , Rfl:    Cholecalciferol (VITAMIN D ) 50 MCG (2000 UT) tablet, Take 4,000 Units by mouth daily., Disp: , Rfl:    citalopram  (CELEXA ) 20 MG tablet, Take 1 tablet (20 mg total) by mouth daily., Disp: 90 tablet, Rfl:  3   Cranberry 400 MG CAPS, Take by mouth daily. , Disp: , Rfl:    gabapentin  (NEURONTIN ) 300 MG capsule, TAKE 3 CAPSULES BY MOUTH 3  TIMES DAILY, Disp: 810 capsule, Rfl: 1   meloxicam  (MOBIC ) 15 MG tablet, TAKE 1 TABLET BY MOUTH DAILY  WITH FOOD AS NEEDED FOR PAIN, Disp: 100 tablet, Rfl: 0   Multiple Vitamins-Minerals (BARIATRIC MULTIVITAMINS/IRON) CAPS, Take by mouth daily., Disp: , Rfl:   "

## 2024-02-06 NOTE — Patient Instructions (Signed)
 Acute Bronchitis, Adult  Acute bronchitis is sudden inflammation of the main airways (bronchi) that come off the windpipe (trachea) in the lungs. The swelling causes the airways to get smaller and make more mucus than normal. This can make it hard to breathe and can cause coughing or noisy breathing (wheezing). Acute bronchitis may last several weeks. The cough may last longer. Allergies, asthma, and exposure to smoke may make the condition worse. What are the causes? This condition can be caused by germs and by substances that irritate the lungs, including: Cold and flu viruses. The most common cause of this condition is the virus that causes the common cold. Bacteria. This is less common. Breathing in substances that irritate the lungs, including: Smoke from cigarettes and other forms of tobacco. Dust and pollen. Fumes from household cleaning products, gases, or burned fuel. Indoor or outdoor air pollution. What increases the risk? The following factors may make you more likely to develop this condition: A weak body's defense system, also called the immune system. A condition that affects your lungs and breathing, such as asthma. What are the signs or symptoms? Common symptoms of this condition include: Coughing. This may bring up clear, yellow, or green mucus from your lungs (sputum). Wheezing. Runny or stuffy nose. Having too much mucus in your lungs (chest congestion). Shortness of breath. Aches and pains, including sore throat or chest. How is this diagnosed? This condition is usually diagnosed based on: Your symptoms and medical history. A physical exam. You may also have other tests, including tests to rule out other conditions, such as pneumonia. These tests include: A test of lung function. Test of a mucus sample to look for the presence of bacteria. Tests to check the oxygen level in your blood. Blood tests. Chest X-ray. How is this treated? Most cases of acute  bronchitis clear up over time without treatment. Your health care provider may recommend: Drinking more fluids to help thin your mucus so it is easier to cough up. Taking inhaled medicine (inhaler) to improve air flow in and out of your lungs. Using a vaporizer or a humidifier. These are machines that add water  to the air to help you breathe better. Taking a medicine that thins mucus and clears congestion (expectorant). Taking a medicine that prevents or stops coughing (cough suppressant). It is not common to take an antibiotic medicine for this condition. Follow these instructions at home:  Take over-the-counter and prescription medicines only as told by your health care provider. Use an inhaler, vaporizer, or humidifier as told by your health care provider. Take two teaspoons (10 mL) of honey at bedtime to lessen coughing at night. Drink enough fluid to keep your urine pale yellow. Do not use any products that contain nicotine or tobacco. These products include cigarettes, chewing tobacco, and vaping devices, such as e-cigarettes. If you need help quitting, ask your health care provider. Get plenty of rest. Return to your normal activities as told by your health care provider. Ask your health care provider what activities are safe for you. Keep all follow-up visits. This is important. How is this prevented? To lower your risk of getting this condition again: Wash your hands often with soap and water  for at least 20 seconds. If soap and water  are not available, use hand sanitizer. Avoid contact with people who have cold symptoms. Try not to touch your mouth, nose, or eyes with your hands. Avoid breathing in smoke or chemical fumes. Breathing smoke or chemical fumes will make  your condition worse. Get the flu shot every year. Contact a health care provider if: Your symptoms do not improve after 2 weeks. You have trouble coughing up the mucus. Your cough keeps you awake at night. You have  a fever. Get help right away if you: Cough up blood. Feel pain in your chest. Have severe shortness of breath. Faint or keep feeling like you are going to faint. Have a severe headache. Have a fever or chills that get worse. These symptoms may represent a serious problem that is an emergency. Do not wait to see if the symptoms will go away. Get medical help right away. Call your local emergency services (911 in the [place] Do not drive yourself to the hospital. Summary Acute bronchitis is inflammation of the main airways (bronchi) that come off the windpipe (trachea) in the lungs. The swelling causes the airways to get smaller and make more mucus than normal. Drinking more fluids can help thin your mucus so it is easier to cough up. Take over-the-counter and prescription medicines only as told by your health care provider. Do not use any products that contain nicotine or tobacco. These products include cigarettes, chewing tobacco, and vaping devices, such as e-cigarettes. If you need help quitting, ask your health care provider. Contact a health care provider if your symptoms do not improve after 2 weeks. This information is not intended to replace advice given to you by your health care provider. Make sure you discuss any questions you have with your health care provider. Document Revised: 05/02/2021 Document Reviewed: 05/23/2020 Elsevier Patient Education  2024 [employer]Urinary Tract Infection, Female A urinary tract infection (UTI) is an infection in your urinary tract. The urinary tract is made up of organs that make, store, and get rid of pee (urine) in your body. These organs include: The kidneys. The ureters. The bladder. The urethra. What are the causes? Most UTIs are caused by germs called bacteria. They may be in or near your genitals. These germs grow and cause swelling in your urinary tract. What increases the risk? You're more likely to get a UTI if: You're a female. The  urethra is shorter in females than in males. You have a soft tube called a catheter that drains your pee. You can't control when you pee or poop. You have trouble peeing because of: A kidney stone. A urinary blockage. A nerve condition that affects your bladder. Not getting enough to drink. You're sexually active. You use a birth control inside your vagina, like spermicide. You're pregnant. You have low levels of the hormone estrogen in your body. You're an older adult. You're also more likely to get a UTI if you have other health problems. These may include: Diabetes. A weak immune system. Your immune system is your body's defense system. Sickle cell disease. Injury of the spine. What are the signs or symptoms? Symptoms may include: Needing to pee right away. Peeing small amounts often. Pain or burning when you pee. Blood in your pee. Pee that smells bad or odd. Pain in your belly or lower back. You may also: Feel confused. This may be the first symptom in older adults. Vomit. Not feel hungry. Feel tired or easily annoyed. Have a fever or chills. How is this diagnosed? A UTI is diagnosed based on your medical history and an exam. You may also have other tests. These may include: Pee tests. Blood tests. Tests for sexually transmitted infections (STIs). If you've had more than one UTI, you may  need to have imaging studies done to find out why you keep getting them. How is this treated? A UTI can be treated by: Taking antibiotics or other medicines. Drinking enough fluid to keep your pee pale yellow. In rare cases, a UTI can cause a very bad condition called sepsis. Sepsis may be treated in the hospital. Follow these instructions at home: Medicines Take your medicines only as told by your health care provider. If you were given antibiotics, take them as told by your provider. Do not stop taking them even if you start to feel better. General instructions Make sure  you: Pee often and fully. Do not hold your pee for a long time. Wipe from front to back after you pee or poop. Use each tissue only once when you wipe. Pee after you have sex. Do not douche or use sprays or powders in your genital area. Contact a health care provider if: Your symptoms don't get better after 1-2 days of taking antibiotics. Your symptoms go away and then come back. You have a fever or chills. You vomit or feel like you may vomit. Get help right away if: You have very bad pain in your back or lower belly. You faint. This information is not intended to replace advice given to you by your health care provider. Make sure you discuss any questions you have with your health care provider. Document Revised: 12/31/2022 Document Reviewed: 04/25/2022 Elsevier Patient Education  2025 [employer].

## 2024-02-08 NOTE — Telephone Encounter (Signed)
 Patient seen 1/3

## 2024-03-09 ENCOUNTER — Telehealth: Payer: Self-pay

## 2024-03-09 ENCOUNTER — Other Ambulatory Visit: Payer: Self-pay

## 2024-03-09 DIAGNOSIS — Z860100 Personal history of colon polyps, unspecified: Secondary | ICD-10-CM

## 2024-03-09 NOTE — Telephone Encounter (Signed)
 Gastroenterology Pre-Procedure Review  Request Date: 05/31/24 Requesting Physician: Dr. jinny  PATIENT REVIEW QUESTIONS: The patient responded to the following health history questions as indicated:    1. Are you having any GI issues? no 2. Do you have a personal history of Polyps? yes (last colonoscopy performed by dr. Jinny 05/04/19 recommended repeat in 5 years) 3. Do you have a family history of Colon Cancer or Polyps? no 4. Diabetes Mellitus? no 5. Joint replacements in the past 12 months?no 6. Major health problems in the past 3 months?no 7. Any artificial heart valves, MVP, or defibrillator?no    MEDICATIONS & ALLERGIES:    Patient reports the following regarding taking any anticoagulation/antiplatelet therapy:   Plavix, Coumadin, Eliquis, Xarelto , Lovenox, Pradaxa, Brilinta, or Effient? no Aspirin ? no  Patient confirms/reports the following medications:  Current Outpatient Medications  Medication Sig Dispense Refill   acetaminophen  (TYLENOL  8 HOUR) 650 MG CR tablet Take 1 tablet (650 mg total) by mouth every 8 (eight) hours as needed for pain. 30 tablet 0   ALPRAZolam  (XANAX ) 0.5 MG tablet Take 1 tablet (0.5 mg total) by mouth daily as needed for anxiety. 20 tablet 0   benzonatate  (TESSALON  PERLES) 100 MG capsule Take 1 capsule (100 mg total) by mouth 3 (three) times daily as needed for cough. 30 capsule 0   Calcium 500-100 MG-UNIT CHEW Chew by mouth.     Cholecalciferol (VITAMIN D ) 50 MCG (2000 UT) tablet Take 4,000 Units by mouth daily.     citalopram  (CELEXA ) 20 MG tablet Take 1 tablet (20 mg total) by mouth daily. 90 tablet 3   Cranberry 400 MG CAPS Take by mouth daily.      gabapentin  (NEURONTIN ) 300 MG capsule TAKE 3 CAPSULES BY MOUTH 3  TIMES DAILY 810 capsule 1   meloxicam  (MOBIC ) 15 MG tablet TAKE 1 TABLET BY MOUTH DAILY  WITH FOOD AS NEEDED FOR PAIN 100 tablet 0   Multiple Vitamins-Minerals (BARIATRIC MULTIVITAMINS/IRON) CAPS Take by mouth daily.     No current  facility-administered medications for this visit.    Patient confirms/reports the following allergies:  Allergies[1]  No orders of the defined types were placed in this encounter.   AUTHORIZATION INFORMATION Primary Insurance: 1D#: Group #:  Secondary Insurance: 1D#: Group #:  SCHEDULE INFORMATION: Date: 05/31/24 Time: Location: [facility]    [1] No Known Allergies

## 2024-05-31 ENCOUNTER — Ambulatory Visit: Admit: 2024-05-31 | Admitting: Gastroenterology

## 2024-06-30 ENCOUNTER — Ambulatory Visit

## 2024-07-08 ENCOUNTER — Ambulatory Visit
